# Patient Record
Sex: Female | Born: 1940 | Hispanic: No | Smoking: Never smoker
Health system: Southern US, Community
[De-identification: ages and names within clinical notes are randomized; demographics above are authoritative.]

## PROBLEM LIST (undated history)

## (undated) DIAGNOSIS — Z8781 Personal history of (healed) traumatic fracture: Secondary | ICD-10-CM

## (undated) DIAGNOSIS — K591 Functional diarrhea: Secondary | ICD-10-CM

## (undated) DIAGNOSIS — J302 Other seasonal allergic rhinitis: Secondary | ICD-10-CM

## (undated) DIAGNOSIS — L853 Xerosis cutis: Secondary | ICD-10-CM

## (undated) DIAGNOSIS — Z9889 Other specified postprocedural states: Secondary | ICD-10-CM

## (undated) DIAGNOSIS — D696 Thrombocytopenia, unspecified: Secondary | ICD-10-CM

## (undated) DIAGNOSIS — Z8719 Personal history of other diseases of the digestive system: Secondary | ICD-10-CM

## (undated) DIAGNOSIS — E119 Type 2 diabetes mellitus without complications: Secondary | ICD-10-CM

## (undated) DIAGNOSIS — Z8619 Personal history of other infectious and parasitic diseases: Secondary | ICD-10-CM

## (undated) DIAGNOSIS — A048 Other specified bacterial intestinal infections: Secondary | ICD-10-CM

## (undated) DIAGNOSIS — S42201A Unspecified fracture of upper end of right humerus, initial encounter for closed fracture: Secondary | ICD-10-CM

## (undated) DIAGNOSIS — I700 Atherosclerosis of aorta: Secondary | ICD-10-CM

## (undated) DIAGNOSIS — I10 Essential (primary) hypertension: Secondary | ICD-10-CM

## (undated) DIAGNOSIS — Z961 Presence of intraocular lens: Secondary | ICD-10-CM

## (undated) DIAGNOSIS — Z201 Contact with and (suspected) exposure to tuberculosis: Secondary | ICD-10-CM

## (undated) DIAGNOSIS — Z227 Latent tuberculosis: Secondary | ICD-10-CM

## (undated) DIAGNOSIS — M353 Polymyalgia rheumatica: Secondary | ICD-10-CM

## (undated) DIAGNOSIS — E871 Hypo-osmolality and hyponatremia: Secondary | ICD-10-CM

## (undated) DIAGNOSIS — S32040A Wedge compression fracture of fourth lumbar vertebra, initial encounter for closed fracture: Secondary | ICD-10-CM

## (undated) DIAGNOSIS — G9341 Metabolic encephalopathy: Secondary | ICD-10-CM

## (undated) DIAGNOSIS — M810 Age-related osteoporosis without current pathological fracture: Secondary | ICD-10-CM

## (undated) HISTORY — DX: Metabolic encephalopathy: G93.41

## (undated) HISTORY — PX: OTHER SURGICAL HISTORY: SHX169

---

## 2011-12-23 ENCOUNTER — Ambulatory Visit (HOSPITAL_BASED_OUTPATIENT_CLINIC_OR_DEPARTMENT_OTHER)
Admission: RE | Admit: 2011-12-23 | Discharge: 2011-12-23 | Disposition: A | Discharge status: Home / Self Care | Payer: Self-pay | Source: Ambulatory Visit | Attending: Internal Medicine | Admitting: Internal Medicine

## 2011-12-23 ENCOUNTER — Other Ambulatory Visit (HOSPITAL_BASED_OUTPATIENT_CLINIC_OR_DEPARTMENT_OTHER): Payer: Self-pay | Admitting: Internal Medicine

## 2011-12-23 DIAGNOSIS — R52 Pain, unspecified: Secondary | ICD-10-CM

## 2011-12-23 DIAGNOSIS — M25519 Pain in unspecified shoulder: Secondary | ICD-10-CM | POA: Insufficient documentation

## 2015-04-14 DIAGNOSIS — H401190 Primary open-angle glaucoma, unspecified eye, stage unspecified: Secondary | ICD-10-CM | POA: Diagnosis present

## 2015-06-11 DIAGNOSIS — Z961 Presence of intraocular lens: Secondary | ICD-10-CM

## 2015-06-11 HISTORY — DX: Presence of intraocular lens: Z96.1

## 2015-06-26 DIAGNOSIS — M810 Age-related osteoporosis without current pathological fracture: Secondary | ICD-10-CM

## 2015-06-26 DIAGNOSIS — E785 Hyperlipidemia, unspecified: Secondary | ICD-10-CM

## 2015-06-26 DIAGNOSIS — E1169 Type 2 diabetes mellitus with other specified complication: Secondary | ICD-10-CM | POA: Diagnosis present

## 2015-06-26 HISTORY — DX: Age-related osteoporosis without current pathological fracture: M81.0

## 2015-07-18 DIAGNOSIS — Z9889 Other specified postprocedural states: Secondary | ICD-10-CM | POA: Insufficient documentation

## 2015-07-18 HISTORY — DX: Other specified postprocedural states: Z98.890

## 2015-08-13 DIAGNOSIS — E119 Type 2 diabetes mellitus without complications: Secondary | ICD-10-CM

## 2015-08-13 DIAGNOSIS — I10 Essential (primary) hypertension: Secondary | ICD-10-CM | POA: Diagnosis present

## 2015-08-13 DIAGNOSIS — IMO0001 Reserved for inherently not codable concepts without codable children: Secondary | ICD-10-CM

## 2015-08-13 DIAGNOSIS — K7460 Unspecified cirrhosis of liver: Secondary | ICD-10-CM

## 2015-08-13 DIAGNOSIS — E860 Dehydration: Secondary | ICD-10-CM | POA: Diagnosis present

## 2015-08-13 DIAGNOSIS — K7469 Other cirrhosis of liver: Secondary | ICD-10-CM | POA: Diagnosis present

## 2015-08-13 DIAGNOSIS — K219 Gastro-esophageal reflux disease without esophagitis: Secondary | ICD-10-CM | POA: Diagnosis present

## 2015-08-13 DIAGNOSIS — E785 Hyperlipidemia, unspecified: Secondary | ICD-10-CM | POA: Diagnosis present

## 2015-09-26 DIAGNOSIS — Z201 Contact with and (suspected) exposure to tuberculosis: Secondary | ICD-10-CM

## 2015-09-26 DIAGNOSIS — D696 Thrombocytopenia, unspecified: Secondary | ICD-10-CM

## 2015-09-26 DIAGNOSIS — G43009 Migraine without aura, not intractable, without status migrainosus: Secondary | ICD-10-CM | POA: Insufficient documentation

## 2015-09-26 DIAGNOSIS — E1142 Type 2 diabetes mellitus with diabetic polyneuropathy: Secondary | ICD-10-CM | POA: Diagnosis present

## 2015-09-26 DIAGNOSIS — Z8719 Personal history of other diseases of the digestive system: Secondary | ICD-10-CM

## 2015-09-26 HISTORY — DX: Contact with and (suspected) exposure to tuberculosis: Z20.1

## 2015-09-26 HISTORY — DX: Personal history of other diseases of the digestive system: Z87.19

## 2015-09-26 HISTORY — DX: Thrombocytopenia, unspecified: D69.6

## 2015-10-02 DIAGNOSIS — E039 Hypothyroidism, unspecified: Secondary | ICD-10-CM | POA: Diagnosis present

## 2015-11-20 DIAGNOSIS — Z8781 Personal history of (healed) traumatic fracture: Secondary | ICD-10-CM

## 2015-11-20 DIAGNOSIS — R61 Generalized hyperhidrosis: Secondary | ICD-10-CM | POA: Insufficient documentation

## 2015-11-20 DIAGNOSIS — K591 Functional diarrhea: Secondary | ICD-10-CM

## 2015-11-20 DIAGNOSIS — Z8619 Personal history of other infectious and parasitic diseases: Secondary | ICD-10-CM

## 2015-11-20 HISTORY — DX: Functional diarrhea: K59.1

## 2015-11-20 HISTORY — DX: Personal history of other infectious and parasitic diseases: Z86.19

## 2015-11-20 HISTORY — DX: Personal history of (healed) traumatic fracture: Z87.81

## 2015-12-15 DIAGNOSIS — I700 Atherosclerosis of aorta: Secondary | ICD-10-CM

## 2015-12-15 DIAGNOSIS — Z227 Latent tuberculosis: Secondary | ICD-10-CM

## 2015-12-15 HISTORY — DX: Atherosclerosis of aorta: I70.0

## 2015-12-15 HISTORY — DX: Latent tuberculosis: Z22.7

## 2015-12-18 DIAGNOSIS — Z789 Other specified health status: Secondary | ICD-10-CM | POA: Diagnosis present

## 2016-01-20 DIAGNOSIS — E1129 Type 2 diabetes mellitus with other diabetic kidney complication: Secondary | ICD-10-CM | POA: Insufficient documentation

## 2016-01-20 DIAGNOSIS — R809 Proteinuria, unspecified: Secondary | ICD-10-CM

## 2016-02-03 DIAGNOSIS — Z23 Encounter for immunization: Secondary | ICD-10-CM | POA: Insufficient documentation

## 2016-03-16 DIAGNOSIS — Z789 Other specified health status: Secondary | ICD-10-CM | POA: Insufficient documentation

## 2016-04-14 DIAGNOSIS — L853 Xerosis cutis: Secondary | ICD-10-CM

## 2016-04-14 HISTORY — DX: Xerosis cutis: L85.3

## 2016-09-02 DIAGNOSIS — J302 Other seasonal allergic rhinitis: Secondary | ICD-10-CM

## 2016-09-02 HISTORY — DX: Other seasonal allergic rhinitis: J30.2

## 2016-10-07 DIAGNOSIS — A048 Other specified bacterial intestinal infections: Secondary | ICD-10-CM | POA: Insufficient documentation

## 2016-10-07 HISTORY — DX: Other specified bacterial intestinal infections: A04.8

## 2016-12-29 DIAGNOSIS — H26491 Other secondary cataract, right eye: Secondary | ICD-10-CM | POA: Diagnosis present

## 2017-01-12 DIAGNOSIS — S32040A Wedge compression fracture of fourth lumbar vertebra, initial encounter for closed fracture: Secondary | ICD-10-CM

## 2017-01-12 DIAGNOSIS — M353 Polymyalgia rheumatica: Secondary | ICD-10-CM

## 2017-01-12 HISTORY — DX: Polymyalgia rheumatica: M35.3

## 2017-01-12 HISTORY — DX: Wedge compression fracture of fourth lumbar vertebra, initial encounter for closed fracture: S32.040A

## 2017-02-15 DIAGNOSIS — R768 Other specified abnormal immunological findings in serum: Secondary | ICD-10-CM | POA: Diagnosis present

## 2017-03-23 DIAGNOSIS — N183 Chronic kidney disease, stage 3 unspecified: Secondary | ICD-10-CM | POA: Diagnosis present

## 2017-03-23 DIAGNOSIS — D631 Anemia in chronic kidney disease: Secondary | ICD-10-CM | POA: Diagnosis present

## 2017-03-25 ENCOUNTER — Emergency Department (HOSPITAL_BASED_OUTPATIENT_CLINIC_OR_DEPARTMENT_OTHER)
Admission: EM | Admit: 2017-03-25 | Discharge: 2017-03-25 | Disposition: A | Discharge status: Home / Self Care | Payer: Medicare Other | Attending: Emergency Medicine | Admitting: Emergency Medicine

## 2017-03-25 ENCOUNTER — Emergency Department (HOSPITAL_BASED_OUTPATIENT_CLINIC_OR_DEPARTMENT_OTHER): Payer: Medicare Other

## 2017-03-25 ENCOUNTER — Other Ambulatory Visit: Payer: Self-pay

## 2017-03-25 ENCOUNTER — Encounter (HOSPITAL_BASED_OUTPATIENT_CLINIC_OR_DEPARTMENT_OTHER): Payer: Self-pay | Admitting: Emergency Medicine

## 2017-03-25 DIAGNOSIS — W19XXXA Unspecified fall, initial encounter: Secondary | ICD-10-CM | POA: Insufficient documentation

## 2017-03-25 DIAGNOSIS — Y9301 Activity, walking, marching and hiking: Secondary | ICD-10-CM | POA: Insufficient documentation

## 2017-03-25 DIAGNOSIS — E119 Type 2 diabetes mellitus without complications: Secondary | ICD-10-CM | POA: Diagnosis not present

## 2017-03-25 DIAGNOSIS — Y998 Other external cause status: Secondary | ICD-10-CM | POA: Diagnosis not present

## 2017-03-25 DIAGNOSIS — Y929 Unspecified place or not applicable: Secondary | ICD-10-CM | POA: Insufficient documentation

## 2017-03-25 DIAGNOSIS — I10 Essential (primary) hypertension: Secondary | ICD-10-CM | POA: Diagnosis not present

## 2017-03-25 DIAGNOSIS — S42294A Other nondisplaced fracture of upper end of right humerus, initial encounter for closed fracture: Secondary | ICD-10-CM | POA: Diagnosis not present

## 2017-03-25 DIAGNOSIS — R55 Syncope and collapse: Secondary | ICD-10-CM | POA: Diagnosis not present

## 2017-03-25 DIAGNOSIS — E871 Hypo-osmolality and hyponatremia: Secondary | ICD-10-CM

## 2017-03-25 DIAGNOSIS — S4991XA Unspecified injury of right shoulder and upper arm, initial encounter: Secondary | ICD-10-CM | POA: Diagnosis present

## 2017-03-25 HISTORY — DX: Type 2 diabetes mellitus without complications: E11.9

## 2017-03-25 HISTORY — DX: Essential (primary) hypertension: I10

## 2017-03-25 LAB — COMPREHENSIVE METABOLIC PANEL
ALK PHOS: 57 U/L (ref 38–126)
ALT: 26 U/L (ref 14–54)
ANION GAP: 7 (ref 5–15)
AST: 29 U/L (ref 15–41)
Albumin: 3.2 g/dL — ABNORMAL LOW (ref 3.5–5.0)
BILIRUBIN TOTAL: 0.7 mg/dL (ref 0.3–1.2)
BUN: 18 mg/dL (ref 6–20)
CALCIUM: 8.6 mg/dL — AB (ref 8.9–10.3)
CO2: 23 mmol/L (ref 22–32)
Chloride: 98 mmol/L — ABNORMAL LOW (ref 101–111)
Creatinine, Ser: 1.16 mg/dL — ABNORMAL HIGH (ref 0.44–1.00)
GFR calc non Af Amer: 44 mL/min — ABNORMAL LOW (ref >60)
GFR, EST AFRICAN AMERICAN: 51 mL/min — AB (ref >60)
Glucose, Bld: 345 mg/dL — ABNORMAL HIGH (ref 65–99)
Potassium: 4.4 mmol/L (ref 3.5–5.1)
SODIUM: 128 mmol/L — AB (ref 135–145)
TOTAL PROTEIN: 6.9 g/dL (ref 6.5–8.1)

## 2017-03-25 LAB — CBC WITH DIFFERENTIAL/PLATELET
Basophils Absolute: 0 10*3/uL (ref 0.0–0.1)
Basophils Relative: 0 %
Eosinophils Absolute: 0 10*3/uL (ref 0.0–0.7)
Eosinophils Relative: 0 %
HCT: 32.1 % — ABNORMAL LOW (ref 36.0–46.0)
HEMOGLOBIN: 10.5 g/dL — AB (ref 12.0–15.0)
LYMPHS ABS: 1.8 10*3/uL (ref 0.7–4.0)
LYMPHS PCT: 15 %
MCH: 29.9 pg (ref 26.0–34.0)
MCHC: 32.7 g/dL (ref 30.0–36.0)
MCV: 91.5 fL (ref 78.0–100.0)
MONOS PCT: 7 %
Monocytes Absolute: 0.9 10*3/uL (ref 0.1–1.0)
NEUTROS ABS: 9.4 10*3/uL — AB (ref 1.7–7.7)
NEUTROS PCT: 78 %
Platelets: 168 10*3/uL (ref 150–400)
RBC: 3.51 MIL/uL — ABNORMAL LOW (ref 3.87–5.11)
RDW: 14 % (ref 11.5–15.5)
WBC: 12 10*3/uL — ABNORMAL HIGH (ref 4.0–10.5)

## 2017-03-25 LAB — CBG MONITORING, ED
GLUCOSE-CAPILLARY: 343 mg/dL — AB (ref 65–99)
Glucose-Capillary: 283 mg/dL — ABNORMAL HIGH (ref 65–99)

## 2017-03-25 LAB — TROPONIN I: Troponin I: <0.03 ng/mL (ref <0.03)

## 2017-03-25 MED ORDER — SODIUM CHLORIDE 0.9 % IV BOLUS (SEPSIS)
1000.0000 mL | Freq: Once | INTRAVENOUS | Status: AC
  Administered 2017-03-25: 1000 mL via INTRAVENOUS

## 2017-03-25 MED ORDER — MORPHINE SULFATE (PF) 4 MG/ML IV SOLN
4.0000 mg | Freq: Once | INTRAVENOUS | Status: AC
  Administered 2017-03-25: 4 mg via INTRAVENOUS
  Filled 2017-03-25: qty 1

## 2017-03-25 MED ORDER — HYDROCODONE-ACETAMINOPHEN 5-325 MG PO TABS: 1.0000 | ORAL_TABLET | Freq: Four times a day (QID) | ORAL | 0 refills | Status: DC | PRN

## 2017-03-25 NOTE — ED Notes (Signed)
Pt returned from xray  Family at the bedside 

## 2017-03-25 NOTE — ED Triage Notes (Signed)
Pt fell at home onto right shoulder from standing position to carpeted floor.  Pt tells family that she states her vision goes black and she falls.  This has been happening frequently.  Pt's right shoulder appears sagging slightly.

## 2017-03-25 NOTE — Discharge Instructions (Signed)
Take tylenol for pain.   Take vicodin for severe pain.   Use shoulder immobilizer to keep shoulder stable.   See Dr. Ophelia CharterYates' office in a week for repeat xray.   Use wheelchair as you might not be able to use your walker   Your salt level is low. Repeat BMP in a week with your doctor.   Return to ER if you pass out, chest pain, trouble breathing, worse shoulder pain.

## 2017-03-25 NOTE — ED Provider Notes (Signed)
[facility] EMERGENCY DEPARTMENT Provider Note   CSN: [id number] Arrival date & time: 03/25/17  1704     History   Chief Complaint Chief Complaint  Patient presents with  . Loss of Consciousness    HPI Lori Hardy is a 76 y.o. female history of diabetes, hypertension, CKD who presented with fall.  Patient walks with a walker at baseline and started up and was walking and the vision went black and she fell onto the right shoulder.  Daughter was there and witnessed this happening.  Unclear if she passed out or not but she definitely had no seizure-like activity.  Daughter noted a right shoulder deformity.  Patient has been unsteady at baseline and the last time she fell was in July of this year.  Denies any chest pain or shortness of breath or abdominal pain.  She has been eating and drinking well, no fevers at home.   The history is provided by the patient and a relative.    Past Medical History:  Diagnosis Date  . Diabetes mellitus without complication (HCC)   . Hypertension     There are no active problems to display for this patient.   Past Surgical History:  Procedure Laterality Date  . cataracts      OB History    No data available       Home Medications    Prior to Admission medications   Not on File    Family History No family history on file.  Social History Social History   Tobacco Use  . Smoking status: Never Smoker  . Smokeless tobacco: Never Used  Substance Use Topics  . Alcohol use: Not on file  . Drug use: Not on file     Allergies   Patient has no known allergies.   Review of Systems Review of Systems  Musculoskeletal:       R shoulder pain   All other systems reviewed and are negative.    Physical Exam Updated Vital Signs BP (!) 160/103   Pulse 72   Temp 98.3 F (36.8 C)   Resp 16   Ht 5\' 3"  (1.6 m)   Wt 66.7 kg (147 lb)   SpO2 100%   BMI 26.04 kg/m   Physical Exam  Constitutional: She is oriented to  person, place, and time.  Chronically ill, uncomfortable   HENT:  Head: Normocephalic and atraumatic.  Eyes: Conjunctivae and EOM are normal. Pupils are equal, round, and reactive to light.  Neck: Normal range of motion. Neck supple.  No obvious midline deformity   Cardiovascular: Normal rate, regular rhythm and normal heart sounds.  Pulmonary/Chest: Effort normal and breath sounds normal. No stridor. No respiratory distress. She has no wheezes.  Abdominal: Soft. Bowel sounds are normal. She exhibits no distension. There is no tenderness. There is no guarding.  Musculoskeletal:  R shoulder ? Anterior dislocation. Unable to range shoulder. ? R clavicle tenderness as well. No midline spinal tenderness. Nl ROM bilateral hips. Mild R knee tenderness but no obvious deformity   Neurological: She is alert and oriented to person, place, and time.  Skin: Skin is warm.  Psychiatric: She has a normal mood and affect.  Nursing note and vitals reviewed.    ED Treatments / Results  Labs (all labs ordered are listed, but only abnormal results are displayed) Labs Reviewed  CBG MONITORING, ED - Abnormal; Notable for the following components:      Result Value   Glucose-Capillary 343 (*)  All other components within normal limits  URINALYSIS, ROUTINE W REFLEX MICROSCOPIC  CBC WITH DIFFERENTIAL/PLATELET  COMPREHENSIVE METABOLIC PANEL  TROPONIN I  CBG MONITORING, ED    EKG  EKG Interpretation None       Radiology No results found.  Procedures Procedures (including critical care time)  Medications Ordered in ED Medications  morphine 4 MG/ML injection 4 mg (not administered)  sodium chloride 0.9 % bolus 1,000 mL (not administered)     Initial Impression / Assessment and Plan / ED Course  I have reviewed the triage vital signs and the nursing notes.  Pertinent labs & imaging results that were available during my care of the patient were reviewed by me and considered in my medical  decision making (see chart for details).     Lori Hardy is a 76 y.o. female here with fall, R shoulder pain. Unclear if she had a syncope or not. I think she likely was unsteady and then fell. Has obvious R shoulder deformity. Will get labs, CT head/neck, shoulder xray.    9:10 PM WBC 12. Na 128. Not orthostatic. CT head/neck unremarkable. Xray showed R proximal humerus fracture. Unable to get UA as patient missed the hat, no urinary symptoms per family. I ordered wheelchair that they can pick up at [facility]. Will dc home with vicodin for pain, shoulder immobilizer, ortho follow up   Final Clinical Impressions(s) / ED Diagnoses   Final diagnoses:  None    ED Discharge Orders    None       Charlynne PanderYao, Anamaria Dusenbury Hsienta, MD 03/25/17 2113

## 2017-04-06 DIAGNOSIS — S42201A Unspecified fracture of upper end of right humerus, initial encounter for closed fracture: Secondary | ICD-10-CM

## 2017-04-06 HISTORY — DX: Unspecified fracture of upper end of right humerus, initial encounter for closed fracture: S42.201A

## 2017-04-18 DIAGNOSIS — E871 Hypo-osmolality and hyponatremia: Secondary | ICD-10-CM

## 2017-04-18 HISTORY — DX: Hypo-osmolality and hyponatremia: E87.1

## 2017-06-28 DIAGNOSIS — R634 Abnormal weight loss: Secondary | ICD-10-CM | POA: Diagnosis present

## 2017-06-28 DIAGNOSIS — R801 Persistent proteinuria, unspecified: Secondary | ICD-10-CM | POA: Insufficient documentation

## 2018-06-18 ENCOUNTER — Inpatient Hospital Stay (HOSPITAL_COMMUNITY): Payer: Medicare Other

## 2018-06-18 ENCOUNTER — Emergency Department (HOSPITAL_COMMUNITY): Payer: Medicare Other

## 2018-06-18 ENCOUNTER — Inpatient Hospital Stay (HOSPITAL_COMMUNITY)
Admission: EM | Admit: 2018-06-18 | Discharge: 2018-06-22 | DRG: 871 | Disposition: A | Discharge status: Rehabilitation Facility | Payer: Medicare Other | Attending: Family Medicine | Admitting: Family Medicine

## 2018-06-18 ENCOUNTER — Encounter (HOSPITAL_COMMUNITY): Payer: Self-pay

## 2018-06-18 DIAGNOSIS — K7460 Unspecified cirrhosis of liver: Secondary | ICD-10-CM | POA: Diagnosis present

## 2018-06-18 DIAGNOSIS — E876 Hypokalemia: Secondary | ICD-10-CM | POA: Diagnosis not present

## 2018-06-18 DIAGNOSIS — E1142 Type 2 diabetes mellitus with diabetic polyneuropathy: Secondary | ICD-10-CM | POA: Diagnosis present

## 2018-06-18 DIAGNOSIS — R29723 NIHSS score 23: Secondary | ICD-10-CM | POA: Diagnosis present

## 2018-06-18 DIAGNOSIS — E1122 Type 2 diabetes mellitus with diabetic chronic kidney disease: Secondary | ICD-10-CM | POA: Diagnosis present

## 2018-06-18 DIAGNOSIS — A419 Sepsis, unspecified organism: Secondary | ICD-10-CM

## 2018-06-18 DIAGNOSIS — E039 Hypothyroidism, unspecified: Secondary | ICD-10-CM | POA: Diagnosis present

## 2018-06-18 DIAGNOSIS — E871 Hypo-osmolality and hyponatremia: Secondary | ICD-10-CM | POA: Diagnosis present

## 2018-06-18 DIAGNOSIS — N390 Urinary tract infection, site not specified: Secondary | ICD-10-CM | POA: Diagnosis present

## 2018-06-18 DIAGNOSIS — R1110 Vomiting, unspecified: Secondary | ICD-10-CM

## 2018-06-18 DIAGNOSIS — R109 Unspecified abdominal pain: Secondary | ICD-10-CM

## 2018-06-18 DIAGNOSIS — N179 Acute kidney failure, unspecified: Secondary | ICD-10-CM | POA: Diagnosis present

## 2018-06-18 DIAGNOSIS — Z79899 Other long term (current) drug therapy: Secondary | ICD-10-CM

## 2018-06-18 DIAGNOSIS — K219 Gastro-esophageal reflux disease without esophagitis: Secondary | ICD-10-CM | POA: Diagnosis present

## 2018-06-18 DIAGNOSIS — D631 Anemia in chronic kidney disease: Secondary | ICD-10-CM | POA: Diagnosis present

## 2018-06-18 DIAGNOSIS — R531 Weakness: Secondary | ICD-10-CM | POA: Diagnosis not present

## 2018-06-18 DIAGNOSIS — R82998 Other abnormal findings in urine: Secondary | ICD-10-CM | POA: Diagnosis present

## 2018-06-18 DIAGNOSIS — R4182 Altered mental status, unspecified: Secondary | ICD-10-CM | POA: Diagnosis present

## 2018-06-18 DIAGNOSIS — B370 Candidal stomatitis: Secondary | ICD-10-CM | POA: Diagnosis present

## 2018-06-18 DIAGNOSIS — I4891 Unspecified atrial fibrillation: Secondary | ICD-10-CM | POA: Diagnosis not present

## 2018-06-18 DIAGNOSIS — E872 Acidosis: Secondary | ICD-10-CM | POA: Diagnosis present

## 2018-06-18 DIAGNOSIS — G9341 Metabolic encephalopathy: Secondary | ICD-10-CM | POA: Diagnosis present

## 2018-06-18 DIAGNOSIS — M353 Polymyalgia rheumatica: Secondary | ICD-10-CM | POA: Diagnosis present

## 2018-06-18 DIAGNOSIS — M62421 Contracture of muscle, right upper arm: Secondary | ICD-10-CM | POA: Diagnosis present

## 2018-06-18 DIAGNOSIS — I129 Hypertensive chronic kidney disease with stage 1 through stage 4 chronic kidney disease, or unspecified chronic kidney disease: Secondary | ICD-10-CM | POA: Diagnosis present

## 2018-06-18 DIAGNOSIS — E860 Dehydration: Secondary | ICD-10-CM | POA: Diagnosis present

## 2018-06-18 DIAGNOSIS — A4181 Sepsis due to Enterococcus: Secondary | ICD-10-CM | POA: Diagnosis present

## 2018-06-18 DIAGNOSIS — G40909 Epilepsy, unspecified, not intractable, without status epilepticus: Secondary | ICD-10-CM | POA: Diagnosis present

## 2018-06-18 DIAGNOSIS — K7469 Other cirrhosis of liver: Secondary | ICD-10-CM | POA: Diagnosis present

## 2018-06-18 DIAGNOSIS — H26491 Other secondary cataract, right eye: Secondary | ICD-10-CM | POA: Diagnosis present

## 2018-06-18 DIAGNOSIS — D638 Anemia in other chronic diseases classified elsewhere: Secondary | ICD-10-CM

## 2018-06-18 DIAGNOSIS — E1169 Type 2 diabetes mellitus with other specified complication: Secondary | ICD-10-CM | POA: Diagnosis present

## 2018-06-18 DIAGNOSIS — I10 Essential (primary) hypertension: Secondary | ICD-10-CM | POA: Diagnosis present

## 2018-06-18 DIAGNOSIS — N10 Acute tubulo-interstitial nephritis: Secondary | ICD-10-CM

## 2018-06-18 DIAGNOSIS — R339 Retention of urine, unspecified: Secondary | ICD-10-CM | POA: Diagnosis not present

## 2018-06-18 DIAGNOSIS — E1165 Type 2 diabetes mellitus with hyperglycemia: Secondary | ICD-10-CM | POA: Diagnosis present

## 2018-06-18 DIAGNOSIS — Z794 Long term (current) use of insulin: Secondary | ICD-10-CM

## 2018-06-18 DIAGNOSIS — Z789 Other specified health status: Secondary | ICD-10-CM | POA: Diagnosis present

## 2018-06-18 DIAGNOSIS — N183 Chronic kidney disease, stage 3 unspecified: Secondary | ICD-10-CM | POA: Diagnosis present

## 2018-06-18 DIAGNOSIS — Z888 Allergy status to other drugs, medicaments and biological substances status: Secondary | ICD-10-CM

## 2018-06-18 DIAGNOSIS — H518 Other specified disorders of binocular movement: Secondary | ICD-10-CM | POA: Diagnosis present

## 2018-06-18 DIAGNOSIS — R32 Unspecified urinary incontinence: Secondary | ICD-10-CM | POA: Diagnosis not present

## 2018-06-18 DIAGNOSIS — R768 Other specified abnormal immunological findings in serum: Secondary | ICD-10-CM | POA: Diagnosis present

## 2018-06-18 DIAGNOSIS — N182 Chronic kidney disease, stage 2 (mild): Secondary | ICD-10-CM

## 2018-06-18 DIAGNOSIS — K5900 Constipation, unspecified: Secondary | ICD-10-CM | POA: Diagnosis present

## 2018-06-18 DIAGNOSIS — Z8673 Personal history of transient ischemic attack (TIA), and cerebral infarction without residual deficits: Secondary | ICD-10-CM

## 2018-06-18 DIAGNOSIS — I169 Hypertensive crisis, unspecified: Secondary | ICD-10-CM | POA: Diagnosis not present

## 2018-06-18 DIAGNOSIS — G9340 Encephalopathy, unspecified: Secondary | ICD-10-CM

## 2018-06-18 DIAGNOSIS — Z6825 Body mass index (BMI) 25.0-25.9, adult: Secondary | ICD-10-CM

## 2018-06-18 DIAGNOSIS — E785 Hyperlipidemia, unspecified: Secondary | ICD-10-CM | POA: Diagnosis present

## 2018-06-18 DIAGNOSIS — J302 Other seasonal allergic rhinitis: Secondary | ICD-10-CM | POA: Diagnosis present

## 2018-06-18 DIAGNOSIS — I700 Atherosclerosis of aorta: Secondary | ICD-10-CM | POA: Diagnosis present

## 2018-06-18 DIAGNOSIS — E119 Type 2 diabetes mellitus without complications: Secondary | ICD-10-CM

## 2018-06-18 DIAGNOSIS — R634 Abnormal weight loss: Secondary | ICD-10-CM | POA: Diagnosis present

## 2018-06-18 DIAGNOSIS — H401190 Primary open-angle glaucoma, unspecified eye, stage unspecified: Secondary | ICD-10-CM | POA: Diagnosis present

## 2018-06-18 DIAGNOSIS — M810 Age-related osteoporosis without current pathological fracture: Secondary | ICD-10-CM | POA: Diagnosis present

## 2018-06-18 DIAGNOSIS — IMO0001 Reserved for inherently not codable concepts without codable children: Secondary | ICD-10-CM

## 2018-06-18 DIAGNOSIS — Z79890 Hormone replacement therapy (postmenopausal): Secondary | ICD-10-CM

## 2018-06-18 DIAGNOSIS — G8384 Todd's paralysis (postepileptic): Secondary | ICD-10-CM | POA: Diagnosis present

## 2018-06-18 DIAGNOSIS — K529 Noninfective gastroenteritis and colitis, unspecified: Secondary | ICD-10-CM | POA: Diagnosis present

## 2018-06-18 DIAGNOSIS — Z8619 Personal history of other infectious and parasitic diseases: Secondary | ICD-10-CM

## 2018-06-18 DIAGNOSIS — E669 Obesity, unspecified: Secondary | ICD-10-CM | POA: Diagnosis present

## 2018-06-18 HISTORY — DX: Age-related osteoporosis without current pathological fracture: M81.0

## 2018-06-18 HISTORY — DX: Personal history of other infectious and parasitic diseases: Z86.19

## 2018-06-18 HISTORY — DX: Other specified postprocedural states: Z98.890

## 2018-06-18 HISTORY — DX: Wedge compression fracture of fourth lumbar vertebra, initial encounter for closed fracture: S32.040A

## 2018-06-18 HISTORY — DX: Unspecified fracture of upper end of right humerus, initial encounter for closed fracture: S42.201A

## 2018-06-18 HISTORY — DX: Functional diarrhea: K59.1

## 2018-06-18 HISTORY — DX: Personal history of other diseases of the digestive system: Z87.19

## 2018-06-18 HISTORY — DX: Latent tuberculosis: Z22.7

## 2018-06-18 HISTORY — DX: Other specified bacterial intestinal infections: A04.8

## 2018-06-18 HISTORY — DX: Contact with and (suspected) exposure to tuberculosis: Z20.1

## 2018-06-18 HISTORY — DX: Polymyalgia rheumatica: M35.3

## 2018-06-18 HISTORY — DX: Presence of intraocular lens: Z96.1

## 2018-06-18 HISTORY — DX: Hypo-osmolality and hyponatremia: E87.1

## 2018-06-18 HISTORY — DX: Atherosclerosis of aorta: I70.0

## 2018-06-18 HISTORY — DX: Thrombocytopenia, unspecified: D69.6

## 2018-06-18 HISTORY — DX: Other seasonal allergic rhinitis: J30.2

## 2018-06-18 HISTORY — DX: Xerosis cutis: L85.3

## 2018-06-18 HISTORY — DX: Personal history of (healed) traumatic fracture: Z87.81

## 2018-06-18 LAB — CBC WITH DIFFERENTIAL/PLATELET
Abs Immature Granulocytes: 0.1 10*3/uL — ABNORMAL HIGH (ref 0.00–0.07)
Basophils Absolute: 0 10*3/uL (ref 0.0–0.1)
Basophils Relative: 0 %
Eosinophils Absolute: 0 10*3/uL (ref 0.0–0.5)
Eosinophils Relative: 0 %
HCT: 37.2 % (ref 36.0–46.0)
Hemoglobin: 11.7 g/dL — ABNORMAL LOW (ref 12.0–15.0)
Immature Granulocytes: 1 %
Lymphocytes Relative: 24 %
Lymphs Abs: 3.6 10*3/uL (ref 0.7–4.0)
MCH: 28.1 pg (ref 26.0–34.0)
MCHC: 31.5 g/dL (ref 30.0–36.0)
MCV: 89.2 fL (ref 80.0–100.0)
Monocytes Absolute: 1.5 10*3/uL — ABNORMAL HIGH (ref 0.1–1.0)
Monocytes Relative: 10 %
Neutro Abs: 9.4 10*3/uL — ABNORMAL HIGH (ref 1.7–7.7)
Neutrophils Relative %: 65 %
Platelets: 228 10*3/uL (ref 150–400)
RBC: 4.17 MIL/uL (ref 3.87–5.11)
RDW: 15 % (ref 11.5–15.5)
WBC: 14.5 10*3/uL — ABNORMAL HIGH (ref 4.0–10.5)
nRBC: 0 % (ref 0.0–0.2)

## 2018-06-18 LAB — CBG MONITORING, ED
Glucose-Capillary: 404 mg/dL — ABNORMAL HIGH (ref 70–99)
Glucose-Capillary: 435 mg/dL — ABNORMAL HIGH (ref 70–99)
Glucose-Capillary: 454 mg/dL — ABNORMAL HIGH (ref 70–99)
Glucose-Capillary: 412 mg/dL — ABNORMAL HIGH (ref 70–99)

## 2018-06-18 LAB — COMPREHENSIVE METABOLIC PANEL
ALT: 25 U/L (ref 0–44)
AST: 39 U/L (ref 15–41)
Albumin: 2.7 g/dL — ABNORMAL LOW (ref 3.5–5.0)
Alkaline Phosphatase: 135 U/L — ABNORMAL HIGH (ref 38–126)
Anion gap: 14 (ref 5–15)
BUN: 26 mg/dL — ABNORMAL HIGH (ref 8–23)
CO2: 19 mmol/L — ABNORMAL LOW (ref 22–32)
Calcium: 8.9 mg/dL (ref 8.9–10.3)
Chloride: 94 mmol/L — ABNORMAL LOW (ref 98–111)
Creatinine, Ser: 1.54 mg/dL — ABNORMAL HIGH (ref 0.44–1.00)
GFR calc Af Amer: 37 mL/min — ABNORMAL LOW (ref >60)
GFR calc non Af Amer: 32 mL/min — ABNORMAL LOW (ref >60)
Glucose, Bld: 453 mg/dL — ABNORMAL HIGH (ref 70–99)
Potassium: 4.5 mmol/L (ref 3.5–5.1)
Sodium: 127 mmol/L — ABNORMAL LOW (ref 135–145)
Total Bilirubin: 1.3 mg/dL — ABNORMAL HIGH (ref 0.3–1.2)
Total Protein: 7.6 g/dL (ref 6.5–8.1)

## 2018-06-18 LAB — CSF CELL COUNT WITH DIFFERENTIAL
RBC COUNT CSF: UNDETERMINED /mm3
RBC Count, CSF: 670 /mm3 — ABNORMAL HIGH
Tube #: 1
Tube #: 4
WBC, CSF: 8 /mm3 — ABNORMAL HIGH (ref 0–5)
WBC, CSF: UNDETERMINED /mm3 (ref 0–5)

## 2018-06-18 LAB — INFLUENZA PANEL BY PCR (TYPE A & B)
Influenza A By PCR: NEGATIVE
Influenza B By PCR: NEGATIVE

## 2018-06-18 LAB — BASIC METABOLIC PANEL
Anion gap: 11 (ref 5–15)
BUN: 27 mg/dL — ABNORMAL HIGH (ref 8–23)
CO2: 16 mmol/L — ABNORMAL LOW (ref 22–32)
Calcium: 8 mg/dL — ABNORMAL LOW (ref 8.9–10.3)
Chloride: 97 mmol/L — ABNORMAL LOW (ref 98–111)
Creatinine, Ser: 1.33 mg/dL — ABNORMAL HIGH (ref 0.44–1.00)
GFR calc Af Amer: 44 mL/min — ABNORMAL LOW (ref >60)
GFR calc non Af Amer: 38 mL/min — ABNORMAL LOW (ref >60)
Glucose, Bld: 463 mg/dL — ABNORMAL HIGH (ref 70–99)
Potassium: 3.8 mmol/L (ref 3.5–5.1)
Sodium: 124 mmol/L — ABNORMAL LOW (ref 135–145)

## 2018-06-18 LAB — POCT I-STAT EG7
Acid-base deficit: 8 mmol/L — ABNORMAL HIGH (ref 0.0–2.0)
Bicarbonate: 16.1 mmol/L — ABNORMAL LOW (ref 20.0–28.0)
Calcium, Ion: 1.06 mmol/L — ABNORMAL LOW (ref 1.15–1.40)
HCT: 34 % — ABNORMAL LOW (ref 36.0–46.0)
Hemoglobin: 11.6 g/dL — ABNORMAL LOW (ref 12.0–15.0)
O2 Saturation: 83 %
Potassium: 4.1 mmol/L (ref 3.5–5.1)
Sodium: 130 mmol/L — ABNORMAL LOW (ref 135–145)
TCO2: 17 mmol/L — ABNORMAL LOW (ref 22–32)
pCO2, Ven: 27.3 mmHg — ABNORMAL LOW (ref 44.0–60.0)
pH, Ven: 7.378 (ref 7.250–7.430)
pO2, Ven: 48 mmHg — ABNORMAL HIGH (ref 32.0–45.0)

## 2018-06-18 LAB — LACTIC ACID, PLASMA
Lactic Acid, Venous: 3.5 mmol/L (ref 0.5–1.9)
Lactic Acid, Venous: 2.8 mmol/L (ref 0.5–1.9)

## 2018-06-18 LAB — I-STAT TROPONIN, ED: Troponin i, poc: 0.02 ng/mL (ref 0.00–0.08)

## 2018-06-18 LAB — PROTEIN, CSF: Total  Protein, CSF: 55 mg/dL — ABNORMAL HIGH (ref 15–45)

## 2018-06-18 LAB — GLUCOSE, CSF: GLUCOSE CSF: 200 mg/dL — AB (ref 40–70)

## 2018-06-18 MED ORDER — ENOXAPARIN SODIUM 40 MG/0.4ML ~~LOC~~ SOLN
40.0000 mg | Freq: Every day | SUBCUTANEOUS | Status: DC
  Filled 2018-06-18: qty 0.4

## 2018-06-18 MED ORDER — ONDANSETRON HCL 4 MG PO TABS: 4.0000 mg | ORAL_TABLET | Freq: Four times a day (QID) | ORAL | Status: DC | PRN

## 2018-06-18 MED ORDER — TIMOLOL MALEATE 0.5 % OP SOLN
1.0000 [drp] | Freq: Every day | OPHTHALMIC | Status: DC
  Administered 2018-06-20 – 2018-06-22 (×3): 1 [drp] via OPHTHALMIC
  Filled 2018-06-18: qty 5

## 2018-06-18 MED ORDER — SODIUM CHLORIDE 0.9 % IV BOLUS (SEPSIS)
1000.0000 mL | Freq: Once | INTRAVENOUS | Status: AC
  Administered 2018-06-18: 1000 mL via INTRAVENOUS

## 2018-06-18 MED ORDER — LIDOCAINE-EPINEPHRINE (PF) 2 %-1:200000 IJ SOLN
10.0000 mL | Freq: Once | INTRAMUSCULAR | Status: DC
  Filled 2018-06-18: qty 20

## 2018-06-18 MED ORDER — INSULIN REGULAR(HUMAN) IN NACL 100-0.9 UT/100ML-% IV SOLN
INTRAVENOUS | Status: DC
  Administered 2018-06-18: 3.8 [IU]/h via INTRAVENOUS
  Filled 2018-06-18 (×2): qty 100

## 2018-06-18 MED ORDER — METRONIDAZOLE IN NACL 5-0.79 MG/ML-% IV SOLN
500.0000 mg | Freq: Three times a day (TID) | INTRAVENOUS | Status: DC
  Administered 2018-06-18 – 2018-06-20 (×6): 500 mg via INTRAVENOUS
  Filled 2018-06-18 (×6): qty 100

## 2018-06-18 MED ORDER — LORAZEPAM 2 MG/ML IJ SOLN: 1.0000 mg | Freq: Once | INTRAMUSCULAR | Status: DC

## 2018-06-18 MED ORDER — DORZOLAMIDE HCL 2 % OP SOLN
1.0000 [drp] | Freq: Two times a day (BID) | OPHTHALMIC | Status: DC
  Administered 2018-06-19 – 2018-06-22 (×6): 1 [drp] via OPHTHALMIC
  Filled 2018-06-18: qty 10

## 2018-06-18 MED ORDER — IOPAMIDOL (ISOVUE-370) INJECTION 76%
50.0000 mL | Freq: Once | INTRAVENOUS | Status: AC | PRN
  Administered 2018-06-18: 50 mL via INTRAVENOUS

## 2018-06-18 MED ORDER — SODIUM CHLORIDE 0.9 % IV SOLN
INTRAVENOUS | Status: DC | PRN
  Administered 2018-06-18: 30 mL via INTRAVENOUS

## 2018-06-18 MED ORDER — VANCOMYCIN HCL IN DEXTROSE 1-5 GM/200ML-% IV SOLN
1000.0000 mg | INTRAVENOUS | Status: DC
  Administered 2018-06-19: 1000 mg via INTRAVENOUS
  Filled 2018-06-18 (×2): qty 200

## 2018-06-18 MED ORDER — SODIUM CHLORIDE 0.9 % IV SOLN
2.0000 g | Freq: Once | INTRAVENOUS | Status: AC
  Administered 2018-06-18: 2 g via INTRAVENOUS
  Filled 2018-06-18: qty 2

## 2018-06-18 MED ORDER — SODIUM CHLORIDE 0.9 % IV SOLN: INTRAVENOUS | Status: AC

## 2018-06-18 MED ORDER — ENOXAPARIN SODIUM 30 MG/0.3ML ~~LOC~~ SOLN: 30.0000 mg | SUBCUTANEOUS | Status: DC

## 2018-06-18 MED ORDER — SODIUM CHLORIDE 0.9 % IV BOLUS (SEPSIS)
250.0000 mL | Freq: Once | INTRAVENOUS | Status: AC
  Administered 2018-06-18: 250 mL via INTRAVENOUS

## 2018-06-18 MED ORDER — SODIUM CHLORIDE 0.9 % IV SOLN
2.0000 g | INTRAVENOUS | Status: DC
  Administered 2018-06-19: 2 g via INTRAVENOUS
  Filled 2018-06-18 (×2): qty 2

## 2018-06-18 MED ORDER — IOPAMIDOL (ISOVUE-370) INJECTION 76%
INTRAVENOUS | Status: AC
  Filled 2018-06-18: qty 50

## 2018-06-18 MED ORDER — SODIUM CHLORIDE 0.9 % IV SOLN
INTRAVENOUS | Status: DC
  Administered 2018-06-19 – 2018-06-20 (×3): via INTRAVENOUS

## 2018-06-18 MED ORDER — ACETAMINOPHEN 325 MG PO TABS: 650.0000 mg | ORAL_TABLET | Freq: Four times a day (QID) | ORAL | Status: DC | PRN

## 2018-06-18 MED ORDER — ONDANSETRON HCL 4 MG/2ML IJ SOLN
4.0000 mg | Freq: Four times a day (QID) | INTRAMUSCULAR | Status: DC | PRN
  Administered 2018-06-19: 4 mg via INTRAVENOUS
  Filled 2018-06-18 (×2): qty 2

## 2018-06-18 MED ORDER — DEXTROSE-NACL 5-0.45 % IV SOLN
INTRAVENOUS | Status: DC
  Administered 2018-06-19 – 2018-06-20 (×2): via INTRAVENOUS

## 2018-06-18 MED ORDER — LATANOPROST 0.005 % OP SOLN
1.0000 [drp] | Freq: Every day | OPHTHALMIC | Status: DC
  Administered 2018-06-19 – 2018-06-20 (×2): 1 [drp] via OPHTHALMIC
  Filled 2018-06-18: qty 2.5

## 2018-06-18 MED ORDER — VANCOMYCIN HCL IN DEXTROSE 1-5 GM/200ML-% IV SOLN
1000.0000 mg | Freq: Once | INTRAVENOUS | Status: AC
  Administered 2018-06-18: 1000 mg via INTRAVENOUS
  Filled 2018-06-18: qty 200

## 2018-06-18 MED ORDER — DEXTROSE 5 % IV SOLN
10.0000 mg/kg | Freq: Two times a day (BID) | INTRAVENOUS | Status: DC
  Administered 2018-06-19 (×2): 660 mg via INTRAVENOUS
  Filled 2018-06-18 (×3): qty 13.2

## 2018-06-18 MED ORDER — ACETAMINOPHEN 650 MG RE SUPP: 650.0000 mg | Freq: Four times a day (QID) | RECTAL | Status: DC | PRN

## 2018-06-18 MED ORDER — LORAZEPAM 2 MG/ML IJ SOLN
INTRAMUSCULAR | Status: AC
  Administered 2018-06-18: 1 mg
  Filled 2018-06-18: qty 1

## 2018-06-18 NOTE — ED Notes (Signed)
Patient transported to MRI 

## 2018-06-18 NOTE — ED Provider Notes (Signed)
  Physical Exam  BP (!) 165/85   Pulse 93   Temp (!) 101.2 F (38.4 C) (Rectal)   Resp (!) 22   Wt 66.2 kg   SpO2 100%   BMI 25.86 kg/m   Physical Exam  ED Course/Procedures     .Lumbar Puncture Date/Time: 06/18/2018 3:29 PM Performed by: Pricilla Loveless, MD Authorized by: Pricilla Loveless, MD   Consent:    Consent obtained:  Verbal and written   Consent given by: daughter.   Risks discussed:  Bleeding, headache, nerve damage, infection and pain Pre-procedure details:    Procedure purpose:  Diagnostic   Preparation: Patient was prepped and draped in usual sterile fashion   Anesthesia (see MAR for exact dosages):    Anesthesia method:  Local infiltration   Local anesthetic:  Lidocaine 1% w/o epi Procedure details:    Lumbar space:  L4-L5 interspace   Patient position:  R lateral decubitus   Needle gauge:  20   Number of attempts:  2   Fluid appearance:  Blood-tinged then clearing   Tubes of fluid:  4   Total volume (ml):  5 Post-procedure:    Puncture site:  Adhesive bandage applied and direct pressure applied   Patient tolerance of procedure:  Tolerated well, no immediate complications Comments:     Bloody then clearing  .Critical Care Performed by: Pricilla Loveless, MD Authorized by: Pricilla Loveless, MD   Critical care provider statement:    Critical care time (minutes):  35   Critical care time was exclusive of:  Separately billable procedures and treating other patients   Critical care was necessary to treat or prevent imminent or life-threatening deterioration of the following conditions:  CNS failure or compromise and sepsis   Critical care was time spent personally by me on the following activities:  Development of treatment plan with patient or surrogate, discussions with consultants, evaluation of patient's response to treatment, examination of patient, obtaining history from patient or surrogate, ordering and performing treatments and interventions, ordering  and review of laboratory studies, ordering and review of radiographic studies, pulse oximetry, re-evaluation of patient's condition and review of old charts    MDM  Patient presents with altered mental state and fever.  Urinalysis concerning for UTI, but at the same time she is having no movement in her left upper extremity starting today.  This is new for her.  Given this, concern for CNS infection.  Could be new stroke, but in the setting of high fever, CNS process needs to be ruled out.  I consented son and daughter.  They agreed to lumbar puncture and we discussed risk/benefits/complications.  LP performed as above, with bloody tap that then cleared.  She will be covered for meningitis as well as herpes encephalitis.  She will need admission to the hospitalist service.  She is otherwise awake and alert though altered.  She is protecting her airway.       Pricilla Loveless, MD 06/18/18 1540

## 2018-06-18 NOTE — Progress Notes (Signed)
Stat EEG completed, results pending  

## 2018-06-18 NOTE — ED Notes (Signed)
[facility] @ radiology called re: U/S orders.  In lieu of the u/s abd limited and renal, change order to u/s abd complete. Dr. Selena Batten returned page and will change order.  Called [facility] and advised.
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
## 2018-06-18 NOTE — ED Provider Notes (Addendum)
[facility]   CSN: [id number] Arrival date & time: 06/18/18  1233     History   Chief Complaint No chief complaint on file.   HPI Lori Hardy is a 78 y.o. female.  HPI patient presents to the emergency department with altered mental status, weakness, fever and vomiting.  The daughter states that this started yesterday with the weakness and seemed to gotten worse over the last 24 hours.  The daughter states that before that she seemed to be in her normal state.  The patient's caregiver is also here and states that she is normally up and ready to go by the time she gets there and she does use a walker to help mobilize.  The patient is unable to give any history at this time. Past Medical History:  Diagnosis Date  . Diabetes mellitus without complication (HCC)   . Hypertension     There are no active problems to display for this patient.   Past Surgical History:  Procedure Laterality Date  . cataracts       OB History   No obstetric history on file.      Home Medications    Prior to Admission medications   Medication Sig Start Date End Date Taking? Authorizing Provider  amLODipine (NORVASC) 5 MG tablet Take 5 mg by mouth daily.    Yes [provider]  Cholecalciferol (VITAMIN D3) 25 MCG (1000 UT) CAPS Take 1,000 mg by mouth daily.   Yes [provider]  diphenoxylate-atropine (LOMOTIL) 2.5-0.025 MG tablet Take 1 tablet by mouth 4 (four) times daily as needed.   Yes [provider]  dorzolamide (TRUSOPT) 2 % ophthalmic solution Place 1 drop into the right eye 2 (two) times daily. 02/27/18  Yes [provider]  esomeprazole (NEXIUM) 40 MG capsule Take 40 mg daily at 12 noon by mouth.   Yes [provider]  glipiZIDE (GLUCOTROL) 10 MG tablet Take 10 mg daily before breakfast by mouth.   Yes [provider]  Insulin Glargine (BASAGLAR KWIKPEN) 100 UNIT/ML SOPN Inject  14 Units into the skin at bedtime. 05/29/18  Yes [provider]  latanoprost (XALATAN) 0.005 % ophthalmic solution Place 1 drop into both eyes at bedtime. 06/17/18  Yes [provider]  levothyroxine (SYNTHROID, LEVOTHROID) 25 MCG tablet Take 25 mcg daily before breakfast by mouth.   Yes [provider]  loratadine (CLARITIN) 10 MG tablet Take 10 mg by mouth daily. 12/27/17  Yes [provider]  losartan (COZAAR) 100 MG tablet Take 100 mg daily by mouth.   Yes [provider]  Melatonin 1 MG TABS Take 1 mg by mouth at bedtime.   Yes [provider]  metoprolol tartrate (LOPRESSOR) 25 MG tablet Take 25 mg by mouth 2 (two) times daily. 05/17/18  Yes [provider]  ondansetron (ZOFRAN) 8 MG tablet Take every 8 (eight) hours as needed by mouth for nausea or vomiting.   Yes [provider]  pioglitazone (ACTOS) 45 MG tablet Take 45 mg daily by mouth.   Yes [provider]  rosuvastatin (CRESTOR) 10 MG tablet Take 10 mg by mouth at bedtime.    Yes [provider]  timolol (TIMOPTIC) 0.5 % ophthalmic solution Place 1 drop into the right eye daily. 02/27/18  Yes [provider]  traMADol (ULTRAM) 50 MG tablet Take 50 mg by mouth every 6 (six) hours as needed for pain. 06/08/18  Yes [provider]  HYDROcodone-acetaminophen (NORCO/VICODIN) 5-325 MG tablet Take 1 tablet every 6 (six) hours as needed by mouth. Patient not taking: Reported on 06/18/2018 03/25/17   Charlynne Pander, MD    Family History History reviewed. No pertinent family history.  Social History Social History   Tobacco Use  . Smoking status: Never Smoker  . Smokeless tobacco: Never Used  Substance Use Topics  . Alcohol use: Not on file  . Drug use: Not on file     Allergies   Brimonidine   Review of Systems Review of Systems Level 5 caveat applies due to altered mental status and severe illness.  Physical  Exam Updated Vital Signs BP (!) 165/85   Pulse 93   Temp (!) 101.2 F (38.4 C) (Rectal)   Resp (!) 22   Wt 66.2 kg   SpO2 100%   BMI 25.86 kg/m   Physical Exam Vitals signs and nursing note reviewed.  Constitutional:      General: She is not in acute distress.    Appearance: She is well-developed.  HENT:     Head: Normocephalic and atraumatic.  Eyes:     Pupils: Pupils are equal, round, and reactive to light.  Neck:     Musculoskeletal: Normal range of motion and neck supple.  Cardiovascular:     Rate and Rhythm: Normal rate and regular rhythm.     Heart sounds: Normal heart sounds. No murmur. No friction rub. No gallop.   Pulmonary:     Effort: Pulmonary effort is normal. No respiratory distress.     Breath sounds: Normal breath sounds. No wheezing.  Abdominal:     General: Bowel sounds are normal. There is no distension.     Palpations: Abdomen is soft.     Tenderness: There is no abdominal tenderness.  Skin:    General: Skin is warm and dry.     Capillary Refill: Capillary refill takes less than 2 seconds.     Findings: No erythema or rash.  Neurological:     Mental Status: She is alert.     Comments: Patient has a rightward gaze and left arm and leg weakness.  Psychiatric:        Behavior: Behavior normal.      ED Treatments / Results  Labs (all labs ordered are listed, but only abnormal results are displayed) Labs Reviewed  LACTIC ACID, PLASMA - Abnormal; Notable for the following components:      Result Value   Lactic Acid, Venous 3.5 (*)    All other components within normal limits  COMPREHENSIVE METABOLIC PANEL - Abnormal; Notable for the following components:   Sodium 127 (*)    Chloride 94 (*)    CO2 19 (*)    Glucose, Bld 453 (*)    BUN 26 (*)    Creatinine, Ser 1.54 (*)    Albumin 2.7 (*)    Alkaline Phosphatase 135 (*)    Total Bilirubin 1.3 (*)    GFR calc non Af Amer 32 (*)    GFR calc Af Amer 37 (*)    All other components within  normal limits  CBC WITH DIFFERENTIAL/PLATELET - Abnormal; Notable for the following components:   WBC 14.5 (*)    Hemoglobin 11.7 (*)    Neutro Abs 9.4 (*)    Monocytes Absolute 1.5 (*)    Abs Immature Granulocytes 0.10 (*)    All other components within normal limits  URINALYSIS, ROUTINE W REFLEX MICROSCOPIC - Abnormal; Notable for  the following components:   Color, Urine AMBER (*)    APPearance TURBID (*)    Glucose, UA >=500 (*)    Hgb urine dipstick MODERATE (*)    Protein, ur >=300 (*)    Leukocytes, UA MODERATE (*)    WBC, UA >50 (*)    Bacteria, UA MANY (*)    Non Squamous Epithelial 0-5 (*)    All other components within normal limits  CBG MONITORING, ED - Abnormal; Notable for the following components:   Glucose-Capillary 404 (*)    All other components within normal limits  CULTURE, BLOOD (ROUTINE X 2)  CULTURE, BLOOD (ROUTINE X 2)  CSF CULTURE  GRAM STAIN  INFLUENZA PANEL BY PCR (TYPE A & B)  LACTIC ACID, PLASMA  CSF CELL COUNT WITH DIFFERENTIAL  CSF CELL COUNT WITH DIFFERENTIAL  GLUCOSE, CSF  PROTEIN, CSF  HERPES SIMPLEX VIRUS(HSV) DNA BY PCR    EKG EKG Interpretation  Date/Time:  Sunday June 18 2018 12:46:34 EST Ventricular Rate:  96 PR Interval:    QRS Duration: 77 QT Interval:  353 QTC Calculation: 444 R Axis:   -35 Text Interpretation:  Sinus rhythm Left axis deviation Abnormal R-wave progression, early transition Baseline wander in lead(s) V3 V6 Poor data quality in current ECG precludes serial comparison Confirmed by Pricilla LovelessGoldston, Scott [phone]([id number]) on 06/18/2018 1:07:22 PM   Radiology Dg Chest 2 View  Result Date: 06/18/2018 CLINICAL DATA:  Weakness and headache EXAM: CHEST - 2 VIEW COMPARISON:  CT chest 06/07/2018 FINDINGS: Atherosclerotic calcification of the aortic arch. Low lung volumes are present, causing crowding of the pulmonary vasculature. Hazy density along the left lateral costophrenic angle. The lungs appear otherwise clear. IMPRESSION: 1.  There is hazy density along the left lateral costophrenic angle which is probably anterior in position based on the lateral projection, and most likely due to the patient's epicardial adipose tissue combined with the low lung volumes. Lingular pneumonia is a less likely differential diagnostic consideration. Correlate with any abnormal breath sounds at the left lung base. 2.  Aortic Atherosclerosis (ICD10-I70.0). 3. Low lung volumes are present, causing crowding of the pulmonary vasculature. Electronically Signed   By: Gaylyn RongWalter  Liebkemann M.D.   On: 06/18/2018 14:05   Ct Head Wo Contrast  Result Date: 06/18/2018 CLINICAL DATA:  Decreased activity, left arm weakness EXAM: CT HEAD WITHOUT CONTRAST TECHNIQUE: Contiguous axial images were obtained from the base of the skull through the vertex without intravenous contrast. COMPARISON:  04/18/2017 FINDINGS: Brain: Remote lacunar infarct posteriorly along the lentiform nucleus. Small remote lacunar infarct in the head of the left caudate nucleus. Periventricular white matter and corona radiata hypodensities favor chronic ischemic microvascular white matter disease. The brainstem and cerebellum appear unremarkable. No significant ventricular abnormality. No intracranial hemorrhage, mass lesion, or acute CVA. Vascular: There is atherosclerotic calcification of the cavernous carotid arteries bilaterally. Skull: Unremarkable Sinuses/Orbits: Mild chronic ethmoid sinusitis. Other: No supplemental non-categorized findings. IMPRESSION: 1. No acute intracranial findings. 2. Certain hypervascular 3. 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 4. Atherosclerosis. 5. Mild chronic ethmoid sinusitis. Electronically Signed   By: Gaylyn RongWalter  Liebkemann M.D.   On: 06/18/2018 14:02    Procedures Procedures (including critical care time)  Medications Ordered in ED Medications  metroNIDAZOLE (FLAGYL) IVPB 500 mg (0 mg  Intravenous Stopped 06/18/18 1524)  0.9 %  sodium chloride infusion (30 mLs Intravenous New Bag/Given 06/18/18 1356)  ceFEPIme (MAXIPIME) 2 g in sodium chloride 0.9 %  100 mL IVPB (has no administration in time range)  vancomycin (VANCOCIN) IVPB 1000 mg/200 mL premix (has no administration in time range)  lidocaine-EPINEPHrine (XYLOCAINE W/EPI) 2 %-1:200000 (PF) injection 10 mL (has no administration in time range)  sodium chloride 0.9 % bolus 1,000 mL (1,000 mLs Intravenous New Bag/Given 06/18/18 1358)    And  sodium chloride 0.9 % bolus 1,000 mL (1,000 mLs Intravenous New Bag/Given 06/18/18 1402)    And  sodium chloride 0.9 % bolus 250 mL (250 mLs Intravenous New Bag/Given 06/18/18 1403)  ceFEPIme (MAXIPIME) 2 g in sodium chloride 0.9 % 100 mL IVPB (0 g Intravenous Stopped 06/18/18 1427)  vancomycin (VANCOCIN) IVPB 1000 mg/200 mL premix (1,000 mg Intravenous New Bag/Given 06/18/18 1429)     Initial Impression / Assessment and Plan / ED Course  I have reviewed the triage vital signs and the nursing notes.  Pertinent labs & imaging results that were available during my care of the patient were reviewed by me and considered in my medical decision making (see chart for details).    CRITICAL CARE Performed by: Jamesetta Orleans Payden Bonus Total critical care time:45 minutes Critical care time was exclusive of separately billable procedures and treating other patients. Critical care was necessary to treat or prevent imminent or life-threatening deterioration. Critical care was time spent personally by me on the following activities: development of treatment plan with patient and/or surrogate as well as nursing, discussions with consultants, evaluation of patient's response to treatment, examination of patient, obtaining history from patient or surrogate, ordering and performing treatments and interventions, ordering and review of laboratory studies, ordering and review of radiographic studies, pulse oximetry and  re-evaluation of patient's condition.   I spoke with the internal medicine residents who will come and evaluate the patient.  I spoke with neurology who advised if the patient's MRI shows positive for stroke he will evaluate the patient at that point.  Patient will need admission to the hospital for further evaluation and care of her most likely sepsis. Final Clinical Impressions(s) / ED Diagnoses   Final diagnoses:  None    ED Discharge Orders    None       Charlestine Night, Cordelia Poche 06/18/18 1541    Pricilla Loveless, MD 06/18/18 1545    Ishika Chesterfield, Cristal Deer, PA-C 06/18/18 1548    Pricilla Loveless, MD 06/19/18 2352

## 2018-06-18 NOTE — ED Triage Notes (Signed)
To room via EMS.  Lives with daughter, has caregiver. Caregiver noticed decreased activity yesterday, last saw her @ 12:45p yesterday.  Caregiver arrived today 11am and noticed pt worse.   EMS BP 125/80 initially, 200/100 2nd BP  95% RA,  Temporal temp 104.0 Pt in normal state converses, ambulates to BR.   EMS gave Solumedrol 125 mg, Mag 2g, HHN Albuterol 10 mg and Atrovent   Left arm noted to be flacid.

## 2018-06-18 NOTE — ED Notes (Signed)
Lori Hardy [phone]

## 2018-06-18 NOTE — ED Notes (Signed)
Per Dr. Amada Jupiter giving 1mg  IV ativan now and may give other 1mg  if instructed.

## 2018-06-18 NOTE — Procedures (Signed)
History: 78 year old female being evaluated for right third gaze deviation  Sedation: None  Technique: This is a 21 channel 40-minute scalp EEG performed at the bedside with bipolar and monopolar montages arranged in accordance to the international 10/20 system of electrode placement. One channel was dedicated to EKG recording.    Background: The background is markedly asymmetric with a relatively high voltage delta and theta activity present on the right side there is no evolution to this activity.  Ativan does not cause a significant change to this activity, but when the patient does fall asleep, this activity continue significantly.  There is also a posterior dominant rhythm seen on the side which does attenuate with eye opening which achieves a frequency of 7 to 8 Hz.  All frequencies on the right side are markedly attenuated, with some irregular delta activity being the predominant finding on that side.  Photic stimulation: Physiologic driving is not performed  EEG Abnormalities: 1) attenuation of faster frequencies in the right 2) generalized irregular delta activity 3) slow posterior dominant rhythm  Clinical Interpretation: This EEG recorded evidence of a widespread right hemispheric dysfunction superimposed on a more generalized nonspecific cerebral dysfunction (encephalopathy).   There was no seizure or seizure predisposition recorded on this study. Please note that lack of epileptiform activity on EEG does not preclude the possibility of epilepsy.   Ritta Slot, MD [facility] [phone]  If 7pm- 7am, please page neurology on call as listed in [facility]ION.

## 2018-06-18 NOTE — Consult Note (Addendum)
NEURO HOSPITALIST CONSULT NOTE   Requesting physician: Dr. Criss Alvine  Reason for Consult:AMS  History obtained from:  Daughter  HPI:                                                                                                                                          Lori Hardy is an 78 y.o. female  With PMH HTN, DM who presented to [facility] ED with c/o AMS, weakness, fever and vomiting.  Per patient's daughter the weakness and vomiting started yesterday and has gotten worse. Patient uses a walker at baseline. Patient does not speak any english. Caregiver stated that she noticed the patient would not use her left arm this morning. Patient noted to have stopped eating on 2/8. Per daughter patient was unable to see snacks on the left side starting on 2/7. Daughter noted that patient had a HA yesterday. Her AMS and left sided symptoms began yesterday and had worsened over the past 24 hours.   Ed course: Tmax: 102.4 ( 39.1) BP: 165/85 Respirations:22 P: 109 UA: + for UTI BG: 404 Na: 127 creatinine 1.54 WBC: 14.5 CTH: no hemorrhage. Small remote lacunar infarcts posteriorly in the right lentiform nucleus  1a Level of Conscious:1 1b LOC Questions: 2 1c LOC Commands: 2 2 Best Gaze: 1 3 Visual: 1 4 Facial Palsy: 0 5a Motor Arm - left: 3 5b Motor Arm - Right: 3 6a Motor Leg - Left: 3 6b Motor Leg - Right: 3 7 Limb Ataxia: 0 8 Sensory: 1 9 Best Language: 3 10 Dysarthria:0 11 Extinct. and Inattention:1 NIHSS TOTAL: 23    Past Medical History:  Diagnosis Date  . Diabetes mellitus without complication (HCC)   . Hypertension     Past Surgical History:  Procedure Laterality Date  . cataracts      History reviewed. No pertinent family history.         Social History:  reports that she has never smoked. She has never used smokeless tobacco. No history on file for alcohol and drug.  Allergies  Allergen Reactions  . Brimonidine Itching    Redness and itching  eye Redness and itching Redness and itching     MEDICATIONS:  Current Facility-Administered Medications  Medication Dose Route Frequency Provider Last Rate Last Dose  . 0.9 %  sodium chloride infusion   Intravenous PRN Pricilla Loveless, MD 100 mL/hr at 06/18/18 1356 30 mL at 06/18/18 1356  . [START ON 06/19/2018] ceFEPIme (MAXIPIME) 2 g in sodium chloride 0.9 % 100 mL IVPB  2 g Intravenous Q24H Robertson, Crystal S, RPH      . lidocaine-EPINEPHrine (XYLOCAINE W/EPI) 2 %-1:200000 (PF) injection 10 mL  10 mL Intradermal Once Pricilla Loveless, MD      . metroNIDAZOLE (FLAGYL) IVPB 500 mg  500 mg Intravenous Q8H Lawyer, Christopher, PA-C   Stopped at 06/18/18 1524  . [START ON 06/19/2018] vancomycin (VANCOCIN) IVPB 1000 mg/200 mL premix  1,000 mg Intravenous Q24H Norva Pavlov, [facility]       Current Outpatient Medications  Medication Sig Dispense Refill  . amLODipine (NORVASC) 5 MG tablet Take 5 mg by mouth daily.     . Cholecalciferol (VITAMIN D3) 25 MCG (1000 UT) CAPS Take 1,000 mg by mouth daily.    . diphenoxylate-atropine (LOMOTIL) 2.5-0.025 MG tablet Take 1 tablet by mouth 4 (four) times daily as needed.    . dorzolamide (TRUSOPT) 2 % ophthalmic solution Place 1 drop into the right eye 2 (two) times daily.    Marland Kitchen esomeprazole (NEXIUM) 40 MG capsule Take 40 mg daily at 12 noon by mouth.    Marland Kitchen glipiZIDE (GLUCOTROL) 10 MG tablet Take 10 mg daily before breakfast by mouth.    . Insulin Glargine (BASAGLAR KWIKPEN) 100 UNIT/ML SOPN Inject 14 Units into the skin at bedtime.    Marland Kitchen latanoprost (XALATAN) 0.005 % ophthalmic solution Place 1 drop into both eyes at bedtime.    Marland Kitchen levothyroxine (SYNTHROID, LEVOTHROID) 25 MCG tablet Take 25 mcg daily before breakfast by mouth.    . loratadine (CLARITIN) 10 MG tablet Take 10 mg by mouth daily.    Marland Kitchen losartan (COZAAR) 100 MG tablet Take 100 mg  daily by mouth.    . Melatonin 1 MG TABS Take 1 mg by mouth at bedtime.    . metoprolol tartrate (LOPRESSOR) 25 MG tablet Take 25 mg by mouth 2 (two) times daily.    . ondansetron (ZOFRAN) 8 MG tablet Take every 8 (eight) hours as needed by mouth for nausea or vomiting.    . pioglitazone (ACTOS) 45 MG tablet Take 45 mg daily by mouth.    . rosuvastatin (CRESTOR) 10 MG tablet Take 10 mg by mouth at bedtime.     . timolol (TIMOPTIC) 0.5 % ophthalmic solution Place 1 drop into the right eye daily.    . traMADol (ULTRAM) 50 MG tablet Take 50 mg by mouth every 6 (six) hours as needed for pain.    Marland Kitchen HYDROcodone-acetaminophen (NORCO/VICODIN) 5-325 MG tablet Take 1 tablet every 6 (six) hours as needed by mouth. (Patient not taking: Reported on 06/18/2018) 8 tablet 0    ROS:  unobtainable from patient due to mental status   Blood pressure (!) 165/85, pulse 93, temperature (!) 101.2 F (38.4 C), temperature source Rectal, resp. rate (!) 22, weight 66.2 kg, SpO2 100 %.   General Examination:                                                                                                      Physical Exam  HEENT-  [place]/AT.  Normal external eye and conjunctiva. No nuchal rigidity.  Cardiovascular- S1-S2 audible,  Lungs-no rhonchi or wheezing noted, no excessive work of breathing.  Saturations within normal limits on RA   Neurological Examination Mental Status: Patient is obtunded, but did open eyes. Has a gaze preference to the right. Not following commands, including when daughter provides interpretation from [place] to [place]. Patient would ask for juice. Will moan with discomfort to noxious stimuli. Left sided neglect.  Cranial Nerves: II:  Did not blink to threat from the left side, but did blink from right side, consistent with a left hemianopsia. III,IV, VI:  PERRL, gaze preference to the right, did not cross midline to the left. This also cannot be overcome with oculocephalic maneuver.  VII: Face grossly symmetric in the context of patient not smiling or grimacing to command VIII: Does not follow verbal commands IX: Head rotated preferentially to the right.  XII: Does not protrude tongue to command.  Motor/ Sensory: Patient appears to neglect LUE and does not move to noxious. Some twitching movements noted in the left arm. Moved with good strength RUE and BLE to noxious.  Deep Tendon Reflexes: 2+ and symmetric patellae Plantars: Right: downgoing   Left: downgoing Cerebellar/Gait: Unable to assess   Lab Results: Basic Metabolic Panel: Recent Labs  Lab 06/18/18 1306  NA 127*  K 4.5  CL 94*  CO2 19*  GLUCOSE 453*  BUN 26*  CREATININE 1.54*  CALCIUM 8.9    CBC: Recent Labs  Lab 06/18/18 1306  WBC 14.5*  NEUTROABS 9.4*  HGB 11.7*  HCT 37.2  MCV 89.2  PLT 228    Imaging: Dg Chest 2 View  Result Date: 06/18/2018 CLINICAL DATA:  Weakness and headache EXAM: CHEST - 2 VIEW COMPARISON:  CT chest 06/07/2018 FINDINGS: Atherosclerotic calcification of the aortic arch. Low lung volumes are present, causing crowding of the pulmonary vasculature. Hazy density along the left lateral costophrenic angle. The lungs appear otherwise clear. IMPRESSION: 1. There is hazy density along the left lateral costophrenic angle which is probably anterior in position based on the lateral projection, and most likely due to the patient's epicardial adipose tissue combined with the low lung volumes. Lingular pneumonia is a less likely differential diagnostic consideration. Correlate with any abnormal breath sounds at the left lung base. 2.  Aortic Atherosclerosis (ICD10-I70.0). 3. Low lung volumes are present, causing crowding of the pulmonary vasculature. Electronically Signed   By: Gaylyn RongWalter  Liebkemann M.D.   On: 06/18/2018 14:05   Ct Head Wo Contrast  Result  Date: 06/18/2018 CLINICAL DATA:  Decreased activity, left arm weakness EXAM: CT HEAD WITHOUT CONTRAST TECHNIQUE: Contiguous axial  images were obtained from the base of the skull through the vertex without intravenous contrast. COMPARISON:  04/18/2017 FINDINGS: Brain: Remote lacunar infarct posteriorly along the lentiform nucleus. Small remote lacunar infarct in the head of the left caudate nucleus. Periventricular white matter and corona radiata hypodensities favor chronic ischemic microvascular white matter disease. The brainstem and cerebellum appear unremarkable. No significant ventricular abnormality. No intracranial hemorrhage, mass lesion, or acute CVA. Vascular: There is atherosclerotic calcification of the cavernous carotid arteries bilaterally. Skull: Unremarkable Sinuses/Orbits: Mild chronic ethmoid sinusitis. Other: No supplemental non-categorized findings. IMPRESSION: 1. No acute intracranial findings. 2. Certain hypervascular 3. 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 4. Atherosclerosis. 5. Mild chronic ethmoid sinusitis. Electronically Signed   By: Gaylyn Rong M.D.   On: 06/18/2018 14:02    Assessment:  78 year old female presenting with malaise/lethargy, fever of 104, vomiting, altered mental status and LUE flaccid weakness  1. CT head reveals no acute intracranial findings. 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  2. Exam findings are most consistent with a lesion versus dysfunction of the right cerebral hemisphere. DDx includes unwitnessed seizure with postictal Todd's paresis and confusion versus a subacute right cerebral hemisphere stroke.  3. LP was performed by EDP. CSF glucose is elevated at 200 consistent with her serum hyperglycemia. RBC elevated most likely secondary to bloody tap as CT shows no subarachnoid hemorrhage and CSF supernatant is not  xanthochromic. CSF protein mildly elevated at 55, consistent with bloody tap. WBC in final tube is 8, a borderline finding which is not felt likely to be of significance given lack of nuchal rigidity on exam.  4. Moderate to severe hyperglycemia, leukocytosis, hyponatremia, elevated BUN/Cr 5. MRI brain preliminarily reviewed. No acute infarction or other lesion seen to explain the patient's left hemineglect, right gaze deviation, left hemianopsia and LUE weakness. Based on unrevealing MRI brain, suspect seizure with Todd's paresis as the most likely component of the DDx 6. Not an endovascular candidate, > 24 hours of neurological symptoms.   Recommendations: - EEG. May start and anticonvulsant pending EEG results.  - Vitamin B12, thiamine levels.  - Ammonia level, TSH - correct metabolic derangements - Being covered with cefepime and vancomycin for meningitis as well as possible herpes encephalitis per EDP note, but CSF profile does not militate in favor of a meningitis.   Valentina Lucks, MSN, NP-C [facility] [phone]  I have examined the patient and interviewed family. I have formulated the assessment and recommendations. 78 year old female presenting with AMS, fever, weakness and vomiting as well as LUE weakness and rightward eye deviation. CT head without acute abnormality. LP overall not consistent with meningitis. Multiple lab findings suggestive of a metabolic encephalopathy in conjunction with systemic infection. EEG and MRI brain are pending.  Electronically signed: Dr. Caryl Pina   06/18/2018, 3:51 PM

## 2018-06-18 NOTE — Progress Notes (Signed)
Pharmacy Antibiotic Note  Lori Hardy is a 78 y.o. female admitted on 06/18/2018 with sepsis of unknown origin.  Pharmacy has been consulted for Vanco/Cefepime dosing.  PMH: diabetes, hypertension, CKD   ID: sepsis of unknown origin. Temp 102.4.WBC 14.5. LA elevated 3.5. Need height for Vanco AUC calculations. Calculated CrCl 31.  Vanco 2/9>> Cefepime 2/9>>   Plan: Vanco 1g IV x 1 then every 24 hrs. Trough after 3-5 doses at steady state. Cefepime 2 IV x 1 then every 24 hrs Flagyl 500mg  IV q 8hrs    Weight: 146 lb (66.2 kg)  Temp (24hrs), Avg:102.4 F (39.1 C), Min:102.4 F (39.1 C), Max:102.4 F (39.1 C)  Recent Labs  Lab 06/18/18 1306  WBC 14.5*  CREATININE 1.54*  LATICACIDVEN 3.5*    CrCl cannot be calculated (Unknown ideal weight.).    Allergies  Allergen Reactions  . Brimonidine Itching    Redness and itching eye Redness and itching Redness and itching    Niquan Charnley S. Merilynn Finland, PharmD, BCPS Clinical Staff Pharmacist  Misty Stanley Stillinger 06/18/2018 2:21 PM

## 2018-06-18 NOTE — ED Notes (Signed)
Patient transported to CT 

## 2018-06-19 ENCOUNTER — Encounter (HOSPITAL_COMMUNITY): Payer: Self-pay

## 2018-06-19 DIAGNOSIS — R109 Unspecified abdominal pain: Secondary | ICD-10-CM | POA: Diagnosis not present

## 2018-06-19 DIAGNOSIS — R739 Hyperglycemia, unspecified: Secondary | ICD-10-CM

## 2018-06-19 DIAGNOSIS — R4182 Altered mental status, unspecified: Secondary | ICD-10-CM

## 2018-06-19 DIAGNOSIS — E039 Hypothyroidism, unspecified: Secondary | ICD-10-CM | POA: Diagnosis not present

## 2018-06-19 DIAGNOSIS — Z794 Long term (current) use of insulin: Secondary | ICD-10-CM

## 2018-06-19 DIAGNOSIS — R531 Weakness: Secondary | ICD-10-CM

## 2018-06-19 DIAGNOSIS — A419 Sepsis, unspecified organism: Secondary | ICD-10-CM

## 2018-06-19 DIAGNOSIS — R569 Unspecified convulsions: Secondary | ICD-10-CM

## 2018-06-19 DIAGNOSIS — E269 Hyperaldosteronism, unspecified: Secondary | ICD-10-CM

## 2018-06-19 DIAGNOSIS — E872 Acidosis: Secondary | ICD-10-CM

## 2018-06-19 DIAGNOSIS — E119 Type 2 diabetes mellitus without complications: Secondary | ICD-10-CM

## 2018-06-19 LAB — GLUCOSE, CAPILLARY
GLUCOSE-CAPILLARY: 154 mg/dL — AB (ref 70–99)
GLUCOSE-CAPILLARY: 187 mg/dL — AB (ref 70–99)
GLUCOSE-CAPILLARY: 173 mg/dL — AB (ref 70–99)
Glucose-Capillary: 48 mg/dL — ABNORMAL LOW (ref 70–99)
Glucose-Capillary: 57 mg/dL — ABNORMAL LOW (ref 70–99)
Glucose-Capillary: 69 mg/dL — ABNORMAL LOW (ref 70–99)
Glucose-Capillary: 123 mg/dL — ABNORMAL HIGH (ref 70–99)
Glucose-Capillary: 166 mg/dL — ABNORMAL HIGH (ref 70–99)
Glucose-Capillary: 183 mg/dL — ABNORMAL HIGH (ref 70–99)
Glucose-Capillary: 172 mg/dL — ABNORMAL HIGH (ref 70–99)
Glucose-Capillary: 144 mg/dL — ABNORMAL HIGH (ref 70–99)
Glucose-Capillary: 181 mg/dL — ABNORMAL HIGH (ref 70–99)
Glucose-Capillary: 179 mg/dL — ABNORMAL HIGH (ref 70–99)
Glucose-Capillary: 240 mg/dL — ABNORMAL HIGH (ref 70–99)
Glucose-Capillary: 243 mg/dL — ABNORMAL HIGH (ref 70–99)
Glucose-Capillary: 209 mg/dL — ABNORMAL HIGH (ref 70–99)
Glucose-Capillary: 182 mg/dL — ABNORMAL HIGH (ref 70–99)
Glucose-Capillary: 141 mg/dL — ABNORMAL HIGH (ref 70–99)
Glucose-Capillary: 122 mg/dL — ABNORMAL HIGH (ref 70–99)

## 2018-06-19 LAB — COMPREHENSIVE METABOLIC PANEL
ALK PHOS: 111 U/L (ref 38–126)
ALT: 21 U/L (ref 0–44)
AST: 28 U/L (ref 15–41)
Albumin: 2.5 g/dL — ABNORMAL LOW (ref 3.5–5.0)
Anion gap: 12 (ref 5–15)
BUN: 29 mg/dL — AB (ref 8–23)
CALCIUM: 8.3 mg/dL — AB (ref 8.9–10.3)
CO2: 16 mmol/L — ABNORMAL LOW (ref 22–32)
Chloride: 103 mmol/L (ref 98–111)
Creatinine, Ser: 1.49 mg/dL — ABNORMAL HIGH (ref 0.44–1.00)
GFR calc Af Amer: 39 mL/min — ABNORMAL LOW (ref >60)
GFR calc non Af Amer: 33 mL/min — ABNORMAL LOW (ref >60)
Glucose, Bld: 289 mg/dL — ABNORMAL HIGH (ref 70–99)
Potassium: 3.3 mmol/L — ABNORMAL LOW (ref 3.5–5.1)
Sodium: 131 mmol/L — ABNORMAL LOW (ref 135–145)
Total Bilirubin: 0.8 mg/dL (ref 0.3–1.2)
Total Protein: 6.5 g/dL (ref 6.5–8.1)

## 2018-06-19 LAB — CBC WITH DIFFERENTIAL/PLATELET
ABS IMMATURE GRANULOCYTES: 0.12 10*3/uL — AB (ref 0.00–0.07)
Basophils Absolute: 0 10*3/uL (ref 0.0–0.1)
Basophils Relative: 0 %
Eosinophils Absolute: 0 10*3/uL (ref 0.0–0.5)
Eosinophils Relative: 0 %
HCT: 31.4 % — ABNORMAL LOW (ref 36.0–46.0)
Hemoglobin: 10 g/dL — ABNORMAL LOW (ref 12.0–15.0)
Immature Granulocytes: 1 %
Lymphocytes Relative: 16 %
Lymphs Abs: 2.3 10*3/uL (ref 0.7–4.0)
MCH: 28.2 pg (ref 26.0–34.0)
MCHC: 31.8 g/dL (ref 30.0–36.0)
MCV: 88.5 fL (ref 80.0–100.0)
MONO ABS: 2 10*3/uL — AB (ref 0.1–1.0)
Monocytes Relative: 14 %
Neutro Abs: 9.9 10*3/uL — ABNORMAL HIGH (ref 1.7–7.7)
Neutrophils Relative %: 69 %
Platelets: 170 10*3/uL (ref 150–400)
RBC: 3.55 MIL/uL — AB (ref 3.87–5.11)
RDW: 15 % (ref 11.5–15.5)
WBC: 14.3 10*3/uL — ABNORMAL HIGH (ref 4.0–10.5)
nRBC: 0 % (ref 0.0–0.2)

## 2018-06-19 LAB — AMMONIA: Ammonia: 14 umol/L (ref 9–35)

## 2018-06-19 LAB — CBG MONITORING, ED
GLUCOSE-CAPILLARY: 392 mg/dL — AB (ref 70–99)
Glucose-Capillary: 341 mg/dL — ABNORMAL HIGH (ref 70–99)
Glucose-Capillary: 300 mg/dL — ABNORMAL HIGH (ref 70–99)
Glucose-Capillary: 151 mg/dL — ABNORMAL HIGH (ref 70–99)
Glucose-Capillary: 96 mg/dL (ref 70–99)

## 2018-06-19 LAB — URINALYSIS, COMPLETE (UACMP) WITH MICROSCOPIC
Bilirubin Urine: NEGATIVE
Glucose, UA: >=500 mg/dL — AB
Ketones, ur: NEGATIVE mg/dL
Nitrite: NEGATIVE
Protein, ur: 100 mg/dL — AB
Specific Gravity, Urine: 1.016 (ref 1.005–1.030)
WBC, UA: >50 WBC/hpf — ABNORMAL HIGH (ref 0–5)
pH: 5 (ref 5.0–8.0)

## 2018-06-19 LAB — LACTIC ACID, PLASMA
LACTIC ACID, VENOUS: 3.9 mmol/L — AB (ref 0.5–1.9)
Lactic Acid, Venous: 2.2 mmol/L (ref 0.5–1.9)
Lactic Acid, Venous: 2.1 mmol/L (ref 0.5–1.9)

## 2018-06-19 LAB — PROTIME-INR
INR: 1.06
Prothrombin Time: 13.7 seconds (ref 11.4–15.2)

## 2018-06-19 LAB — TSH: TSH: 1.678 u[IU]/mL (ref 0.350–4.500)

## 2018-06-19 LAB — PROCALCITONIN: Procalcitonin: 0.11 ng/mL

## 2018-06-19 LAB — VITAMIN B12: Vitamin B-12: 448 pg/mL (ref 180–914)

## 2018-06-19 LAB — CORTISOL-AM, BLOOD: Cortisol - AM: 11.4 ug/dL (ref 6.7–22.6)

## 2018-06-19 MED ORDER — SODIUM CHLORIDE 0.9 % IV BOLUS
1000.0000 mL | Freq: Once | INTRAVENOUS | Status: AC
  Administered 2018-06-19: 1000 mL via INTRAVENOUS

## 2018-06-19 MED ORDER — POTASSIUM CHLORIDE 10 MEQ/100ML IV SOLN
10.0000 meq | INTRAVENOUS | Status: AC
  Administered 2018-06-19 (×2): 10 meq via INTRAVENOUS
  Filled 2018-06-19 (×2): qty 100

## 2018-06-19 MED ORDER — ENOXAPARIN SODIUM 30 MG/0.3ML ~~LOC~~ SOLN
30.0000 mg | Freq: Every day | SUBCUTANEOUS | Status: DC
  Administered 2018-06-19 – 2018-06-20 (×2): 30 mg via SUBCUTANEOUS
  Filled 2018-06-19 (×2): qty 0.3

## 2018-06-19 MED ORDER — DEXTROSE 50 % IV SOLN
12.5000 g | INTRAVENOUS | Status: AC
  Administered 2018-06-19: 12.5 g via INTRAVENOUS

## 2018-06-19 MED ORDER — LEVETIRACETAM IN NACL 500 MG/100ML IV SOLN
500.0000 mg | Freq: Two times a day (BID) | INTRAVENOUS | Status: DC
  Administered 2018-06-19 – 2018-06-21 (×4): 500 mg via INTRAVENOUS
  Filled 2018-06-19 (×4): qty 100

## 2018-06-19 MED ORDER — ALBUTEROL SULFATE (2.5 MG/3ML) 0.083% IN NEBU: 2.5000 mg | INHALATION_SOLUTION | Freq: Four times a day (QID) | RESPIRATORY_TRACT | Status: DC | PRN

## 2018-06-19 MED ORDER — DEXTROSE 50 % IV SOLN
25.0000 g | INTRAVENOUS | Status: AC
  Administered 2018-06-19: 25 g via INTRAVENOUS

## 2018-06-19 NOTE — Progress Notes (Signed)
Report received from [facility] from ed.

## 2018-06-19 NOTE — Progress Notes (Signed)
VAST RN consulted to assess PIV located in upper right shoulder/chest. Spoke with unit RN who stated pt is receiving an insulin drip, fluids, and antibiotics; currently has a PIV in right hand which is working well, a PIV in left [facility] which is working, but beeps occluded frequently, and a PIV in right shoulder/chest which is swollen. Unit RN wanted a second assess before dcing shoulder IV.  VAST RN assessed IV and deemed it is infiltrated; PIV discontinued.  VAST RN assessed pt for further PIV access; however, both arms are somewhat contracted and difficult to assess.  VAST RN informed unit RN of findings and advised that two VAST RN's will return to attempt another line. Unit RN unsure of plan of care for pt as she just arrived early this am.

## 2018-06-19 NOTE — Progress Notes (Signed)
Reckeck blood sugar 125, insulin drip started at 0.55ml/hr per glucose stabilizer. On call MD paged, MD called back and MD aware of situation. Stated will pass on to day shift. Will continue to monitor.

## 2018-06-19 NOTE — Progress Notes (Signed)
Texted paged on call MD and notified pt's recheck blood sugar is 69, 15ml D50 administered per glucose stabilizer and insulin on hold. will recheck cbg in  in 15 min. Will continue to monitor.

## 2018-06-19 NOTE — Progress Notes (Signed)
Family Medicine Teaching Service Daily Progress Note Intern Pager: [phone]  Patient name: Lori Hardy Medical record number: [id number] Date of birth: 01/25/1941 Age: 78 y.o. Gender: female  Primary Care Provider: Ron Parker, MD (Inactive)  Consultants: Neurology, pharm consults, ID  Code Status: Full Code   Pt Overview and Major Events to Date:  Hospital Day: 2  Admitted: 06/18/2018 for CC: AMS  Assessment and Plan: Lori Hardy is a 78 y.o. right handed female presenting with altered mental status, last known well 2/7 afternoon, hyperglycemia and sepsis.  PMH is significant for DM, CKD 3, ACD, HLD, HTN, hypothyroidism, GERD, Leutscher's syndrome, positive ANA, primary open angle glaucoma, polyneuropathy, cirrhosis on imaging.  # AMS  2/2 Septic Encephalopathy in the setting of sepsis and new onset left sided weakness Overnight, Lactic acid of rose to 3.9 and pt provided with 1 L bolus. Remainder of work up significant for EEG showing "widespread right hemispheric dysfuntion superimposed on a more generalized nonspcific cerebral dysfunction," consistent with encephalopathy. AST, ALT and ammonia wnl making hepatic encephalopathy less likely (with patient's remote history of liver cirrhosis on imaging). TSH wnl. Most likely cause on current differential is acute septic encephalopathy (which is the most common cause of acute TME). In septic encephalopathy, MRI findings may be seen but can also be negative.  Additionally, LP protein can be normal or elevated.  EEG findings with diffuse slowing and diffuse muscle weakness can be found in up to 70% of patients.  Treatment of for acute septic encephalopathy is to treat underlying cause.  Speech saw patient today and she is severe aspiration risk and will remain NPO with exception of ice chips PRN after oral care.   D/c Seizure precautions  Follow-up neurology recommendations ? F/u Vitamin B12, thiamine levels  Infection treatment as  below   # Sepsis Febrile on arrival to 102.4. s/p 2L bolus NS in ED. Lactic acidosis improved from 3.5-2.8.  Most likely cause of sepsis is due to UTI.  Urinary culture is currently pending.  CSF cultures show no WBC and no organisms.  Patient has remained afebrile overnight and vital signs have been stable.  Lactic acid peaked at 3.9 at 430 this morning and patient received 1 L bolus.  Plans to continue empiric treatment with vancomycin, cefepime, Flagyl and acyclovir for at least first 24 hours.  Plans to narrow antibiotics.  Follow up urine, blood and CSF cultures.   Continue Vanc, cefepime and flagyl per pharmacy  vanc trough before 4th dose    ID Curbside for acyclovir recs in setting of zosyn  Increase D5NS 125 mL/hr  Recheck LA  # Hyperglycemia  Likely due to acute infection leading to insulin resistance. Patient is also followed by endocrinology outpatient.   Continue insulin drip for better control of sugars and closer monitoring while patient w/ AMS  Increased D5-1/2 NS 154mL/hr  #Sperm on UA  Patient remains altered this morning and unable to elicit history.   Repeat UA   Follow up urine GC  Social Work Consult   # HTN Hypertensive from 140's-160's systolic.   Holding home meds in setting of NPO.   Consider IV antihypertensives  # HLD  Hold all home meds in setting of NPO.    #GERD  Hold all home meds in setting of NPO.    #CKD  Monitor Creatinine   #Hypothyroidism   Hold all home meds in setting of NPO.    #Chornic Diarrhea  C diff negative in PCP 06/07/18  Imodium PRN   # Other Chronic medical issues  Leutscher's syndrome, positive ANA, primary open angle glaucoma, polyneuropathy, liver cirrhosis?  Several other issues per CareEverywhere.   Patient appears to have good follow up at PCP  Will chart review further after management of acute issues   # FEN . Fluids: 125 mL/hr  . Electrolytes: wnl  . NPO  # DVT  Prophylaxis . Lovenox 30  # Disposition . Trending Labs: CBG, CMP, CBC w/ diff  . Remain on progressive until off gtt Subjective  Patient altered this morning. Attempted to use interperter service and patient seems to understand but still continues to respond inappropriately.   Objective   Vital Signs Intake/Output  Temp:  [98.1 F (36.7 C)-102.4 F (39.1 C)] 98.1 F (36.7 C) (02/10 0605) Pulse Rate:  [86-106] 96 (02/10 0605) Resp:  [20-27] 20 (02/10 0605) BP: (122-165)/(51-114) 144/75 (02/10 0605) SpO2:  [96 %-100 %] 100 % (02/10 0605) Weight:  [66.2 kg] 66.2 kg (02/09 1418)  Filed Weights   06/18/18 1400 06/18/18 1418  Weight: 66.2 kg 66.2 kg   Intake/Output      02/09 0701 - 02/10 0700 02/10 0701 - 02/11 0700   I.V. (mL/kg) 100 (1.5)    IV Piggyback 3100    Total Intake(mL/kg) 3200 (48.3)    Net +3200            Physical Exam  Gen: Lying in bed with eyes deviated to right side  Skin: Warm and dry HEENT: NCAT.  MMM.  CV: RRR.  Normal S1-S2. No BLEE. Resp: CTAB. No increased WOB Abd: Distended, nontender. Tympanic to percussion. + BS.  Neuro: left side of mouth appears to be weakened with speech. Unable to follow commands via interpretor. 2+ LE reflexes. Responds to pain bilaterally.   Laboratory: CBC: Recent Labs  Lab 06/18/18 1306 06/18/18 1715 06/19/18 0747  WBC 14.5*  --  14.3*  NEUTROABS 9.4*  --  9.9*  HGB 11.7* 11.6* 10.0*  HCT 37.2 34.0* 31.4*  MCV 89.2  --  88.5  PLT 228  --  170   Basic Metabolic Panel: Recent Labs  Lab 06/18/18 1306 06/18/18 1715 06/18/18 2128 06/19/18 0223  NA 127* 130* 124* 131*  K 4.5 4.1 3.8 3.3*  CL 94*  --  97* 103  CO2 19*  --  16* 16*  GLUCOSE 453*  --  463* 289*  BUN 26*  --  27* 29*  CREATININE 1.54*  --  1.33* 1.49*  CALCIUM 8.9  --  8.0* 8.3*   GFR: Estimated Creatinine Clearance: 29.1 mL/min (A) (by C-G formula based on SCr of 1.49 mg/dL (H)). Liver Function Tests: Recent Labs  Lab 06/18/18 1306  06/19/18 0223  AST 39 28  ALT 25 21  ALKPHOS 135* 111  BILITOT 1.3* 0.8  PROT 7.6 6.5  ALBUMIN 2.7* 2.5*   No results for input(s): LIPASE, AMYLASE in the last 168 hours. Recent Labs  Lab 06/19/18 0223  AMMONIA 14   Coagulation Profile: Recent Labs  Lab 06/19/18 0747  INR 1.06   CBG: Recent Labs  Lab 06/19/18 0817 06/19/18 0947 06/19/18 1107 06/19/18 1220 06/19/18 1322  GLUCAP 166* 183* 187* 172* 144*   Thyroid Function Tests: Recent Labs    06/19/18 0223  TSH 1.678   Anemia Panel: Recent Labs    06/19/18 0223  VITAMINB12 448   Urine analysis:    Component Value Date/Time   COLORURINE AMBER (A) 06/18/2018 1400   APPEARANCEUR TURBID (  A) 06/18/2018 1400   LABSPEC 1.023 06/18/2018 1400   PHURINE 5.0 06/18/2018 1400   GLUCOSEU >=500 (A) 06/18/2018 1400   HGBUR MODERATE (A) 06/18/2018 1400   BILIRUBINUR NEGATIVE 06/18/2018 1400   KETONESUR NEGATIVE 06/18/2018 1400   PROTEINUR >=300 (A) 06/18/2018 1400   NITRITE NEGATIVE 06/18/2018 1400   LEUKOCYTESUR MODERATE (A) 06/18/2018 1400   Sepsis Labs: Recent Labs  Lab 06/18/18 1805  TROPIPOC 0.02    Imaging/Diagnostic Tests: Ct Angio Head W Or Wo Contrast Result Date: 06/18/2018 IMPRESSION: 1. Patent carotid and vertebral arteries. No dissection, aneurysm, or hemodynamically significant stenosis utilizing NASCET criteria. 2. Patent anterior and posterior intracranial circulation. No large vessel occlusion, aneurysm, or significant stenosis. 3. Aortic, carotid bifurcations, and carotid siphon calcific atherosclerosis without significant stenosis.  Dg Chest 2 View Result Date: 06/18/2018 IMPRESSION: 1. There is hazy density along the left lateral costophrenic angle which is probably anterior in position based on the lateral projection, and most likely due to the patient's epicardial adipose tissue combined with the low lung volumes. Lingular pneumonia is a less likely differential diagnostic consideration.  Correlate with any abnormal breath sounds at the left lung base. 2.  Aortic Atherosclerosis (ICD10-I70.0). 3. Low lung volumes are present, causing crowding of the pulmonary vasculature.  Ct Head Wo Contrast IMPRESSION: 1. No acute intracranial findings. 2. Certain hypervascular 3. 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 4. Atherosclerosis. 5. Mild chronic ethmoid sinusitis.   Ct Angio Neck W Or Wo Contrast Result Date: 06/18/2018 IMPRESSION: 1. Patent carotid and vertebral arteries. No dissection, aneurysm, or hemodynamically significant stenosis utilizing NASCET criteria. 2. Patent anterior and posterior intracranial circulation. No large vessel occlusion, aneurysm, or significant stenosis. 3. Aortic, carotid bifurcations, and carotid siphon calcific atherosclerosis without significant stenosis.   Mr Brain Wo Contrast (neuro Protocol) Result Date: 06/18/2018 IMPRESSION: 1.  No acute intracranial abnormality. 2. Generalized cerebral cortical volume loss with advanced signal changes in the white matter, deep gray matter and pons most commonly due to chronic small vessel disease.   US Abdomen Complete Result Date: 06/18/2018 IMPRESSION: Post cholecystectomy. Incomplete visualization of pancreatic tail. No acute abnormalities identified.     Melene Plan, MD 06/19/2018, 7:13 AM PGY-1, [facility] Family Medicine FPTS Intern pager: [phone], text pages welcome

## 2018-06-19 NOTE — Evaluation (Signed)
Clinical/Bedside Swallow Evaluation Patient Details  Name: Lori Hardy MRN: [id number] Date of Birth: 1940-12-16  Today's Date: 06/19/2018 Time: SLP Start Time (ACUTE ONLY): 1212 SLP Stop Time (ACUTE ONLY): 1233 SLP Time Calculation (min) (ACUTE ONLY): 21 min  Past Medical History:  Past Medical History:  Diagnosis Date  . Aortic atherosclerosis (HCC) 12/15/2015   Overview:  By imaging, xray 11/2015.  . Closed fracture of right proximal humerus 04/06/2017  . Compression fracture of L4 lumbar vertebra, closed, initial encounter (HCC) 01/12/2017  . Diabetes mellitus without complication (HCC)   . Exposure to TB 09/26/2015  . 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  Differential includes diet, medications, possible pancreatic insufficiency.  Negative C. difficile 09/27/2015  . H. pylori infection 10/07/2016   Overview:  BX confirmed[facility]   Marland Kitchen. History of compression fracture of spine 11/20/2015   Overview:  T12, on prolia.  Marland Kitchen. History of Helicobacter pylori infection 11/20/2015   Overview:  By EGD 2017.  [facility]  . Hx of pancreatitis 09/26/2015   Overview:  08/2015 [facility]  . Hypertension   . Hyponatremia 04/18/2017  . 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 No active disease on chest xray 11/25/15. Pt from [place], past exposure to TB. Pt agreed to referral and treatment by [facility]- referral sent 12/15/2015 (telephone [phone], fax #[phone]).   . 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 Pseudophakia 06/11/2015  . Seasonal allergic rhinitis 09/02/2016  . Senile osteoporosis 06/26/2015   Overview:  Bone  density 12/2014 [facility]. T-4.4 and -3.8. On prolia managed by Dr. Allena KatzPatel.  Normal intact PTH in 2017.  Marland Kitchen. Thrombocytopenia (HCC) 09/26/2015  . Xerosis of skin 04/14/2016   Past Surgical History:  Past Surgical History:  Procedure Laterality Date  . cataracts     HPI:  Lori AfricaKamlaben Mcdade is a 78 y.o. female presenting with altered mental status, hyperglycemia and sepsis.  PMH is significant for DM, CKD 3, ACD, HLD, HTN, hypothyroidism, GERD, Leutscher's syndrome, positive ANA, primary open angle glaucoma, polyneuropathy, cirrhosis on imaging. MRI No acute intracranial abnormality. 2. Generalized cerebral cortical volume loss with advanced signal changes in the white matter, deep gray matter and pons most commonly   Assessment / Plan / Recommendation Clinical Impression  Pt is currently too lethargic and confused to safely take po's however prognosis is good once she can maintain alertness with improved awareness. Immediate and strong cough with sips water indicative of likely aspiration. Pt stating she is hungry, wanting to eat but falls back asleep. IF she wakes this afternoon or tonight, RN may clean oral cavity and give ice chips and sips water only (separately). ST will plan to follow up tomorrow.   SLP Visit Diagnosis: Dysphagia, unspecified (R13.10)    Aspiration Risk  Severe aspiration risk    Diet Recommendation Ice chips PRN after oral care        Other  Recommendations Oral Care Recommendations: Oral care QID   Follow up Recommendations (TBA)      Frequency and Duration min 2x/week  2 weeks       Prognosis Prognosis for Safe Diet Advancement: Good      Swallow  Study   General HPI: Annalou Lamberson is a 78 y.o. female presenting with altered mental status, hyperglycemia and sepsis.  PMH is significant for DM, CKD 3, ACD, HLD, HTN, hypothyroidism, GERD, Leutscher's syndrome, positive ANA, primary open angle glaucoma, polyneuropathy, cirrhosis on imaging. MRI No acute intracranial  abnormality. 2. Generalized cerebral cortical volume loss with advanced signal changes in the white matter, deep gray matter and pons most commonly Type of Study: Bedside Swallow Evaluation Previous Swallow Assessment: (none) Diet Prior to this Study: NPO Temperature Spikes Noted: No Respiratory Status: Room air History of Recent Intubation: No Behavior/Cognition: Requires cueing;Lethargic/Drowsy;Doesn't follow directions Oral Cavity Assessment: (unable to view) Oral Care Completed by SLP: No Oral Cavity - Dentition: (suspect adequate ) Self-Feeding Abilities: Total assist Patient Positioning: Upright in bed Baseline Vocal Quality: Normal Volitional Cough: Cognitively unable to elicit Volitional Swallow: Unable to elicit    Oral/Motor/Sensory Function Overall Oral Motor/Sensory Function: Other (comment)(will assess)   Ice Chips Ice chips: Not tested   Thin Liquid Thin Liquid: Impaired Presentation: Cup;Straw Oral Phase Impairments: Reduced labial seal Oral Phase Functional Implications: Right anterior spillage;Left anterior spillage Pharyngeal  Phase Impairments: Cough - Delayed;Cough - Immediate    Nectar Thick Nectar Thick Liquid: Not tested   Honey Thick Honey Thick Liquid: Not tested   Puree Puree: Not tested   Solid     Solid: Not tested      Royce Macadamia 06/19/2018,1:08 PM   Breck Coons [place].Ed Nurse, children's [phone] Office [phone]

## 2018-06-19 NOTE — Progress Notes (Signed)
FPTS Interim Progress Note  Received page from ED nurse at 0130 concerning admission order for progressive. Per ED nurse, patient had transfer orders instead of admission orders. Admission orders placed.   Received page at 0400 concerning critical lactic acid of 3.9. 1L fluid bolus given.   Received another page at 0430 with drop in blood sugar from 151 to 96. Insulin ddt decreased and instructed to increase D5-1/2NS from 50 to 21mL. Repeat CBG in 1 hour.  Did not hear from nurse again. Paged floor nurse. CBG's 57 and 49 from right and left hand respectively. Insullin ddt stopped per protocol, 69mL of d50 given. Fluids still running at 83mL/hr. Instructed to increase D5-1/2NS to 38mL/hr and monitor CBG.  Orpah Cobb [place], DO 06/19/2018, 6:24 AM PGY-1, [facility] Family Medicine Service pager [phone]

## 2018-06-19 NOTE — Progress Notes (Signed)
Called to nurse for repeat UA ordered this AM and patient without UOP. Purewic placed. She has been on continuous fluids.   Bladder scan   In and out cath in >400cc   Notify MD if >400 cc   Genia Hotter, M.D. Family Medicine Residency  PGY-1 [p] [phone]  text pages via amion  06/19/2018, 5:37 PM

## 2018-06-19 NOTE — Progress Notes (Signed)
Spoke to son at bedside today. He reports that patient is improved. He reports that she was unable to recognize him two days ago and when he arrived today, she was able to recognize him and called out to him. Son has been notified of patients current status and treatment plan moving forward, which is to continue IV antibiotics until cultures result. Additionally, he would like for patient to return home rather than discharge to SNF.   He reports that he is available by phone at any time of the day and would like to be called if interpretation is needed as she recognizes his voice and may limit any further confusion in patient. [phone]. He stresses he would like to be called at any hour of the day.   Of note, patient does not report pain.   On exam, hemi neglect is resolved. She is able to lift her left upper extremity. She remains very weak on finger squeeze bilaterally and still has confusion with  commands.   Genia Hotterachel Kim, M.D. Family Medicine Residency  PGY-1 [p] [phone]  text pages via amion  06/19/2018, 3:26 PM

## 2018-06-19 NOTE — Progress Notes (Addendum)
Reason for consult: Seizure  Subjective: Patient is awake today, appears confused.  She follows commands intermittently.  ROS:  Unable to obtain due to poor mental status  Examination  Vital signs in last 24 hours: Temp:  [98.1 F (36.7 C)-102.4 F (39.1 C)] 98.4 F (36.9 C) (02/10 0821) Pulse Rate:  [81-106] 94 (02/10 0821) Resp:  [18-27] 18 (02/10 0821) BP: (122-165)/(51-114) 138/104 (02/10 0821) SpO2:  [96 %-100 %] 100 % (02/10 0821) Weight:  [66.2 kg] 66.2 kg (02/09 1418)  General: lying in bed CVS: pulse-normal rate and rhythm RS: breathing comfortably Extremities: normal   Neuro: MS: Awake, follows commands intermittently.  Speaks incoherently in hindi.  CN: pupils equal and reactive,  EOMI, face symmetric, tongue midline,  Motor: 4/ 5 strength in bilateral upper extremities, withdraws to pain in bilateral lower extremities Reflexes:plantars: flexor Coordination: Unable to assess Gait: not tested  Basic Metabolic Panel: Recent Labs  Lab 06/18/18 1306 06/18/18 1715 06/18/18 2128 06/19/18 0223  NA 127* 130* 124* 131*  K 4.5 4.1 3.8 3.3*  CL 94*  --  97* 103  CO2 19*  --  16* 16*  GLUCOSE 453*  --  463* 289*  BUN 26*  --  27* 29*  CREATININE 1.54*  --  1.33* 1.49*  CALCIUM 8.9  --  8.0* 8.3*    CBC: Recent Labs  Lab 06/18/18 1306 06/18/18 1715 06/19/18 0747  WBC 14.5*  --  14.3*  NEUTROABS 9.4*  --  9.9*  HGB 11.7* 11.6* 10.0*  HCT 37.2 34.0* 31.4*  MCV 89.2  --  88.5  PLT 228  --  170     Coagulation Studies: Recent Labs    06/19/18 0747  LABPROT 13.7  INR 1.06    Imaging Reviewed: MRI brain:  no acute findings, negative for acute infarct  EEG Abnormalities: 1) attenuation of faster frequencies in the right 2) generalized irregular delta activity 3) slow posterior dominant rhythm  Clinical Interpretation: This EEG recorded evidence of a widespread right hemispheric dysfunction superimposed on a more generalized nonspecific cerebral  dysfunction (encephalopathy).    ASSESSMENT AND PLAN  42 y  female presenting with altered mental status, fever of 104, left upper extremity flaccid weakness.  MRI brain showed no acute infarction.  LP performed in the emergency room showed cell count of 8 in the setting of traumatic tap, not suggestive of CNS infection. She has subsequently improved no longer having flaccid left upper extremity weakness.  Seizure with postictal Todd's paresis -Likely provoked in the setting of severe hyperglycemia,hyponatremia, sepsis with underlying brain atrophy  Recommendations -Will start patient on Keppra 500 mg twice daily as EEG shows right hemispheric dysfunction and patient likely increased risk of seizures in the future -Seizure precautions -Continue to treat sepsis, hyperglycemia, hyponatremia   Zariana Strub [facility] Pager Number [phone] For questions after 7pm please refer to AMION to reach the Neurologist on call

## 2018-06-19 NOTE — Progress Notes (Signed)
Inpatient Diabetes Program Recommendations  AACE/ADA: New Consensus Statement on Inpatient Glycemic Control  Target Ranges:  Prepandial:   less than 140 mg/dL      Peak postprandial:   less than 180 mg/dL (1-2 hours)      Critically ill patients:  140 - 180 mg/dL  Results for Lori Hardy, Lori Hardy (MRN [id number]) as of 06/19/2018 11:45  Ref. Range 06/18/2018 23:58 06/19/2018 01:06 06/19/2018 02:18 06/19/2018 03:25 06/19/2018 04:29 06/19/2018 05:54 06/19/2018 05:56 06/19/2018 06:26 06/19/2018 06:49 06/19/2018 08:00 06/19/2018 08:17 06/19/2018 09:47 06/19/2018 11:07  Glucose-Capillary Latest Ref Range: 70 - 99 mg/dL 409392 (H) 811341 (H) 914300 (H) 151 (H) 96 57 (L) 48 (L) 69 (L) 123 (H) 154 (H) 166 (H) 183 (H) 187 (H)   Results for Lori Hardy, Lori Hardy (MRN [id number]) as of 06/19/2018 11:45  Ref. Range 06/18/2018 13:06 06/18/2018 21:28 06/19/2018 02:23  Glucose Latest Ref Range: 70 - 99 mg/dL 086453 (H) 578463 (H) 469289 (H)   Review of Glycemic Control  Diabetes history: DM2 Outpatient Diabetes medications: Basaglar 14 units QHS, Glipizide 10 mg QAM, Actos 45 mg daily Current orders for Inpatient glycemic control: IV insulin drip  Inpatient Diabetes Program Recommendations:  Insulin at transition from IV to SQ:  Once MD is ready to transition from IV to SQ insulin, please consider ordering Lantus 13 units Q24H (based on 66 kg x 0.2 units), CBGs Q4H, and Novolog 0-9 units Q4H.  NOTE: In reviewing chart, noted patient is followed by Dr. Allena KatzPatel (Endocrinologist) and was last seen on 04/04/2018 and per office note A1C was 9.5% on 04/04/2018.    Thanks, Orlando PennerMarie Rilley Stash, RN, MSN, CDE Diabetes Coordinator Inpatient Diabetes Program [phone] (Team Pager from 8am to 5pm)

## 2018-06-19 NOTE — Progress Notes (Signed)
Pt received form ED on insulin drip. Pt confused, said in hindi that she is hungry.. CHG bath completed. Connected to tele. CCMD notified. Pt does not speak english. No family by bedside. Left sided weakness noted. lethargic. Call bell within reach. Will continue to monitor.

## 2018-06-20 DIAGNOSIS — A419 Sepsis, unspecified organism: Secondary | ICD-10-CM | POA: Diagnosis not present

## 2018-06-20 DIAGNOSIS — E1169 Type 2 diabetes mellitus with other specified complication: Secondary | ICD-10-CM

## 2018-06-20 DIAGNOSIS — E1136 Type 2 diabetes mellitus with diabetic cataract: Secondary | ICD-10-CM | POA: Diagnosis not present

## 2018-06-20 DIAGNOSIS — K219 Gastro-esophageal reflux disease without esophagitis: Secondary | ICD-10-CM

## 2018-06-20 DIAGNOSIS — N390 Urinary tract infection, site not specified: Secondary | ICD-10-CM

## 2018-06-20 DIAGNOSIS — R531 Weakness: Secondary | ICD-10-CM | POA: Diagnosis not present

## 2018-06-20 DIAGNOSIS — E785 Hyperlipidemia, unspecified: Secondary | ICD-10-CM

## 2018-06-20 DIAGNOSIS — R569 Unspecified convulsions: Secondary | ICD-10-CM | POA: Diagnosis not present

## 2018-06-20 DIAGNOSIS — Z789 Other specified health status: Secondary | ICD-10-CM

## 2018-06-20 DIAGNOSIS — R4182 Altered mental status, unspecified: Secondary | ICD-10-CM | POA: Diagnosis not present

## 2018-06-20 LAB — BASIC METABOLIC PANEL
Anion gap: 9 (ref 5–15)
BUN: 21 mg/dL (ref 8–23)
CO2: 17 mmol/L — ABNORMAL LOW (ref 22–32)
Calcium: 8 mg/dL — ABNORMAL LOW (ref 8.9–10.3)
Chloride: 108 mmol/L (ref 98–111)
Creatinine, Ser: 1.03 mg/dL — ABNORMAL HIGH (ref 0.44–1.00)
GFR calc Af Amer: >60 mL/min (ref >60)
GFR calc non Af Amer: 52 mL/min — ABNORMAL LOW (ref >60)
GLUCOSE: 192 mg/dL — AB (ref 70–99)
Potassium: 3.8 mmol/L (ref 3.5–5.1)
Sodium: 134 mmol/L — ABNORMAL LOW (ref 135–145)

## 2018-06-20 LAB — GLUCOSE, CAPILLARY
Glucose-Capillary: 128 mg/dL — ABNORMAL HIGH (ref 70–99)
Glucose-Capillary: 131 mg/dL — ABNORMAL HIGH (ref 70–99)
Glucose-Capillary: 147 mg/dL — ABNORMAL HIGH (ref 70–99)
Glucose-Capillary: 153 mg/dL — ABNORMAL HIGH (ref 70–99)
Glucose-Capillary: 154 mg/dL — ABNORMAL HIGH (ref 70–99)
Glucose-Capillary: 159 mg/dL — ABNORMAL HIGH (ref 70–99)
Glucose-Capillary: 172 mg/dL — ABNORMAL HIGH (ref 70–99)
Glucose-Capillary: 173 mg/dL — ABNORMAL HIGH (ref 70–99)
Glucose-Capillary: 175 mg/dL — ABNORMAL HIGH (ref 70–99)
Glucose-Capillary: 175 mg/dL — ABNORMAL HIGH (ref 70–99)
Glucose-Capillary: 181 mg/dL — ABNORMAL HIGH (ref 70–99)
Glucose-Capillary: 182 mg/dL — ABNORMAL HIGH (ref 70–99)
Glucose-Capillary: 187 mg/dL — ABNORMAL HIGH (ref 70–99)
Glucose-Capillary: 190 mg/dL — ABNORMAL HIGH (ref 70–99)
Glucose-Capillary: 193 mg/dL — ABNORMAL HIGH (ref 70–99)
Glucose-Capillary: 209 mg/dL — ABNORMAL HIGH (ref 70–99)

## 2018-06-20 LAB — CBC
HCT: 30.3 % — ABNORMAL LOW (ref 36.0–46.0)
Hemoglobin: 10 g/dL — ABNORMAL LOW (ref 12.0–15.0)
MCH: 29.4 pg (ref 26.0–34.0)
MCHC: 33 g/dL (ref 30.0–36.0)
MCV: 89.1 fL (ref 80.0–100.0)
Platelets: 182 10*3/uL (ref 150–400)
RBC: 3.4 MIL/uL — ABNORMAL LOW (ref 3.87–5.11)
RDW: 15.5 % (ref 11.5–15.5)
WBC: 9.6 10*3/uL (ref 4.0–10.5)
nRBC: 0 % (ref 0.0–0.2)

## 2018-06-20 LAB — HERPES SIMPLEX VIRUS(HSV) DNA BY PCR

## 2018-06-20 LAB — HSV DNA BY PCR (REFERENCE LAB)
HSV 1 DNA: NEGATIVE
HSV 2 DNA: NEGATIVE

## 2018-06-20 MED ORDER — INSULIN REGULAR(HUMAN) IN NACL 100-0.9 UT/100ML-% IV SOLN: INTRAVENOUS | Status: AC

## 2018-06-20 MED ORDER — INSULIN ASPART 100 UNIT/ML ~~LOC~~ SOLN
0.0000 [IU] | Freq: Three times a day (TID) | SUBCUTANEOUS | Status: DC
  Administered 2018-06-20: 2 [IU] via SUBCUTANEOUS
  Administered 2018-06-21: 5 [IU] via SUBCUTANEOUS
  Administered 2018-06-21: 3 [IU] via SUBCUTANEOUS
  Administered 2018-06-21: 7 [IU] via SUBCUTANEOUS
  Administered 2018-06-22 (×3): 3 [IU] via SUBCUTANEOUS

## 2018-06-20 MED ORDER — LEVOTHYROXINE SODIUM 25 MCG PO TABS
25.0000 ug | ORAL_TABLET | Freq: Every day | ORAL | Status: DC
  Administered 2018-06-21 – 2018-06-22 (×2): 25 ug via ORAL
  Filled 2018-06-20 (×2): qty 1

## 2018-06-20 MED ORDER — METOPROLOL TARTRATE 25 MG PO TABS
25.0000 mg | ORAL_TABLET | Freq: Two times a day (BID) | ORAL | Status: DC
  Administered 2018-06-20 – 2018-06-22 (×4): 25 mg via ORAL
  Filled 2018-06-20 (×4): qty 1

## 2018-06-20 MED ORDER — GLUCERNA SHAKE PO LIQD
237.0000 mL | Freq: Three times a day (TID) | ORAL | Status: DC
  Administered 2018-06-20 – 2018-06-22 (×6): 237 mL via ORAL

## 2018-06-20 MED ORDER — ADULT MULTIVITAMIN W/MINERALS CH
1.0000 | ORAL_TABLET | Freq: Every day | ORAL | Status: DC
  Administered 2018-06-21 – 2018-06-22 (×2): 1 via ORAL
  Filled 2018-06-20 (×2): qty 1

## 2018-06-20 MED ORDER — SODIUM CHLORIDE 0.9 % IV SOLN
1.0000 g | INTRAVENOUS | Status: DC
  Administered 2018-06-20 – 2018-06-21 (×2): 1 g via INTRAVENOUS
  Filled 2018-06-20 (×3): qty 10

## 2018-06-20 MED ORDER — INSULIN GLARGINE 100 UNIT/ML ~~LOC~~ SOLN
8.0000 [IU] | Freq: Every day | SUBCUTANEOUS | Status: DC
  Administered 2018-06-20 – 2018-06-21 (×2): 8 [IU] via SUBCUTANEOUS
  Filled 2018-06-20 (×2): qty 0.08

## 2018-06-20 MED ORDER — PANTOPRAZOLE SODIUM 40 MG PO TBEC
40.0000 mg | DELAYED_RELEASE_TABLET | Freq: Every day | ORAL | Status: DC
  Administered 2018-06-21 – 2018-06-22 (×2): 40 mg via ORAL
  Filled 2018-06-20 (×2): qty 1

## 2018-06-20 MED ORDER — INSULIN ASPART 100 UNIT/ML ~~LOC~~ SOLN: 0.0000 [IU] | Freq: Three times a day (TID) | SUBCUTANEOUS | Status: DC

## 2018-06-20 MED ORDER — ROSUVASTATIN CALCIUM 5 MG PO TABS
10.0000 mg | ORAL_TABLET | Freq: Every day | ORAL | Status: DC
  Administered 2018-06-20 – 2018-06-21 (×2): 10 mg via ORAL
  Filled 2018-06-20 (×2): qty 2

## 2018-06-20 MED ORDER — INSULIN ASPART 100 UNIT/ML ~~LOC~~ SOLN
0.0000 [IU] | Freq: Every day | SUBCUTANEOUS | Status: DC
  Administered 2018-06-21: 2 [IU] via SUBCUTANEOUS

## 2018-06-20 NOTE — Progress Notes (Signed)
Infectious disease called this RN, pt with r/o meningitis, thus far cultures have been negative. Per ID pt has been on antibiotics for >24 hours and no longer infectious. Droplet precautions can be d/c per ID.

## 2018-06-20 NOTE — Progress Notes (Addendum)
Patient with seen on bladder scan, pt then with episode of incontinence/  large amount in bed soaking pad and sheets. Pt then re-bladder scanned and obtained on bladder scan. Pt then IN and out cathed as ordered and obtained of clear yellow urine. After completion of in and out cath patient was bladder scanned again and on scan. Pt had full linen changed.  Dr. Selena Batten in room to see patient.Channon Ambrosini, Randall An RN

## 2018-06-20 NOTE — Evaluation (Signed)
Physical Therapy Evaluation Patient Details Name: Lori Hardy MRN: [id number] DOB: Dec 02, 1940 Today's Date: 06/20/2018   History of Present Illness  78 yo female with onset of L hemiparesis and acute encephalopathy due to sepsis was admitted and noted no source for her weakness.  Did have blood in CSF with lumbar puncture, but infection from UTI is blamed for hemiparesis.  PMHx:  chronic diarrhea, hypothyroidism, GERD, CKD, seizures,   Clinical Impression  Pt is up to move from initial bedside posture, but is unable to sit unsupported.  Her ability to sit is hindered by L side weakness but will wait for CIR to work toward completion of this goal.  Follow acutely for progression of gait, limited standing tolerance.  Will work toward independent mobility with RW to control standing balance and increase LLE security in WB.    Follow Up Recommendations CIR    Equipment Recommendations  None recommended by PT    Recommendations for Other Services       Precautions / Restrictions Precautions Precautions: Fall;Other (comment)(telemetry) Restrictions Weight Bearing Restrictions: Yes      Mobility  Bed Mobility Overal bed mobility: Needs Assistance Bed Mobility: Supine to Sit;Sit to Supine     Supine to sit: Mod assist Sit to supine: Mod assist   General bed mobility comments: mod to lift trunk and finish scooting to EOB, mod to lift legs and trunk back.  2 person assist to scoot up in bed  Transfers Overall transfer level: Needs assistance Equipment used: Rolling walker (2 wheeled);2 person hand held assist(third person to bring chair as pt on IV) Transfers: Sit to/from [employer] CorporationStand;Stand Pivot Transfers Sit to Stand: Mod assist Stand pivot transfers: Max assist;From elevated surface(max due to gait fatigue)       General transfer comment: cued sitting balance and control of push to stand  Ambulation/Gait Ambulation/Gait assistance: +2 physical assistance;+2 safety/equipment;Mod  assist Gait Distance (Feet): 12 Feet Assistive device: Rolling walker (2 wheeled);2 person hand held assist Gait Pattern/deviations: Step-to pattern;Step-through pattern;Decreased stride length;Wide base of support;Trunk flexed;Decreased weight shift to left Gait velocity: reduced Gait velocity interpretation: <1.8 ft/sec, indicate of risk for recurrent falls General Gait Details: pt is weak, requires dense cues for safety with both sitting and standing   Stairs            Wheelchair Mobility    Modified Rankin (Stroke Patients Only)       Balance Overall balance assessment: (sitting balance is poor, needs min assist to balance)                                           Pertinent Vitals/Pain Pain Assessment: No/denies pain    Home Living Family/patient expects to be discharged to:: Inpatient rehab                 Additional Comments: home with family but has concerning fall history recently    Prior Function Level of Independence: Independent with assistive device(s)         Comments: uses rollator or SPC usually     Hand Dominance   Dominant Hand: Right    Extremity/Trunk Assessment   Upper Extremity Assessment Upper Extremity Assessment: Generalized weakness    Lower Extremity Assessment Lower Extremity Assessment: Generalized weakness    Cervical / Trunk Assessment Cervical / Trunk Assessment: Kyphotic  Communication   Communication: No difficulties  Cognition  Arousal/Alertness: Awake/alert Behavior During Therapy: WFL for tasks assessed/performed Overall Cognitive Status: No family/caregiver present to determine baseline cognitive functioning(family refused translation but cannot translate all conversa)                                 General Comments: Daughter in law cannot translate well      General Comments General comments (skin integrity, edema, etc.): pt is demonstrating control to sit on side of  bed, but in standing needs two person helpl to avoid listing or buckling on LLE    Exercises     Assessment/Plan    PT Assessment Patient needs continued PT services  PT Problem List Decreased strength;Decreased range of motion;Decreased activity tolerance;Decreased balance;Decreased mobility;Decreased coordination;Decreased cognition;Decreased safety awareness;Decreased knowledge of use of DME;Decreased knowledge of precautions;Cardiopulmonary status limiting activity;Obesity       PT Treatment Interventions DME instruction;Gait training;Functional mobility training;Therapeutic activities;Therapeutic exercise;Balance training;Neuromuscular re-education;Cognitive remediation;Patient/family education    PT Goals (Current goals can be found in the Care Plan section)  Acute Rehab PT Goals Patient Stated Goal: via translation plans to walk PT Goal Formulation: With patient/family Time For Goal Achievement: 07/04/18 Potential to Achieve Goals: Good    Frequency Min 3X/week   Barriers to discharge   home with family but is going to have to get up and walk to get into house    Co-evaluation               AM-PAC PT "6 Clicks" Mobility  Outcome Measure Help needed turning from your back to your side while in a flat bed without using bedrails?: A Little Help needed moving from lying on your back to sitting on the side of a flat bed without using bedrails?: A Little Help needed moving to and from a bed to a chair (including a wheelchair)?: A Lot Help needed standing up from a chair using your arms (e.g., wheelchair or bedside chair)?: A Lot Help needed to walk in hospital room?: A Lot Help needed climbing 3-5 steps with a railing? : A Lot 6 Click Score: 14    End of Session Equipment Utilized During Treatment: Gait belt Activity Tolerance: Patient tolerated treatment well;Patient limited by fatigue;Treatment limited secondary to medical complications (Comment) Patient left: in  bed;with call bell/phone within reach;with bed alarm set;with family/visitor present Nurse Communication: Mobility status PT Visit Diagnosis: Unsteadiness on feet (R26.81);Repeated falls (R29.6);Muscle weakness (generalized) (M62.81);Difficulty in walking, not elsewhere classified (R26.2)    Time: 6010-9323 PT Time Calculation (min) (ACUTE ONLY): 26 min   Charges:   PT Evaluation $PT Eval Moderate Complexity: 1 Mod PT Treatments $Gait Training: 8-22 mins       Ivar Drape 06/20/2018, 4:30 PM   Samul Dada, PT MS Acute Rehab Dept. Number: [facility] [phone] and [facility] [phone]

## 2018-06-20 NOTE — Progress Notes (Signed)
Pt voided 550 ml in the bedside commode, post void bladder scan was 207 ml. Will continue to monitor.

## 2018-06-20 NOTE — Progress Notes (Addendum)
Reason for consult: Seizure  Subjective: Patient is more alert and oriented today.  ROS: negative except above  Examination  Vital signs in last 24 hours: Temp:  [97.3 F (36.3 C)-97.9 F (36.6 C)] 97.8 F (36.6 C) (02/11 0812) Pulse Rate:  [83-95] 88 (02/11 0812) Resp:  [16-19] 19 (02/11 0812) BP: (117-153)/(71-96) 136/73 (02/11 0812) SpO2:  [100 %] 100 % (02/11 0812)  General: lying in bed CVS: pulse-normal rate and rhythm RS: breathing comfortably Extremities: normal   Neuro: MS: Alert, oriented to person, place, slightly confused,  follows commands CN: pupils equal and reactive, visual fields are normal, EOMI, face symmetric, tongue midline, normal sensation over face, Motor: 4/5 strength in all 4 extremities Reflexes: 2+ bilaterally over patella, biceps, plantars: flexor Coordination: normal Gait: not tested  Basic Metabolic Panel: Recent Labs  Lab 06/18/18 1306 06/18/18 1715 06/18/18 2128 06/19/18 0223 06/20/18 0633  NA 127* 130* 124* 131* 134*  K 4.5 4.1 3.8 3.3* 3.8  CL 94*  --  97* 103 108  CO2 19*  --  16* 16* 17*  GLUCOSE 453*  --  463* 289* 192*  BUN 26*  --  27* 29* 21  CREATININE 1.54*  --  1.33* 1.49* 1.03*  CALCIUM 8.9  --  8.0* 8.3* 8.0*    CBC: Recent Labs  Lab 06/18/18 1306 06/18/18 1715 06/19/18 0747 06/20/18 0633  WBC 14.5*  --  14.3* 9.6  NEUTROABS 9.4*  --  9.9*  --   HGB 11.7* 11.6* 10.0* 10.0*  HCT 37.2 34.0* 31.4* 30.3*  MCV 89.2  --  88.5 89.1  PLT 228  --  170 182     Coagulation Studies: Recent Labs    06/19/18 0747  LABPROT 13.7  INR 1.06    Imaging Reviewed:     ASSESSMENT AND PLAN  57 y  female presenting with altered mental status, fever of 104, left upper extremity flaccid weakness.  MRI brain showed no acute infarction.  LP performed in the emergency room showed cell count of 8 in the setting of traumatic tap, not suggestive of CNS infection. She has subsequently improved no longer having flaccid left  upper extremity weakness.  He appears to be tolerating seizure medication and does not appear to be excessively drowsy this morning.  Counseled family that it would be better for her to be on a seizure medication, at least for now- despite seizure being provoked as she is at high risk for having seizures in the future.  Seizure with postictal Todd's paresis, resolved  -Likely provoked in the setting of severe hyperglycemia,hyponatremia, sepsis with underlying brain atrophy  Recommendations -Continue Keppra 500 mg twice daily as EEG shows right hemispheric dysfunction and patient likely increased risk of seizures in the future given cerebral atrophy.  -Seizure precautions -Continue to treat sepsis, hyperglycemia, hyponatremia    Neurology will sign off.  Follow-up with outpatient neurology for management of seizure  Georgiana Spinner Gwendy Boeder [facility] Pager Number [phone] For questions after 7pm please refer to AMION to reach the Neurologist on call

## 2018-06-20 NOTE — Progress Notes (Signed)
  Speech Language Pathology Treatment: Dysphagia  Patient Details Name: Lori Hardy MRN: [id number] DOB: [birth year] Today's Date: 06/20/2018 Time: 4827-0786 SLP Time Calculation (min) (ACUTE ONLY): 25 min  Assessment / Plan / Recommendation Clinical Impression  Pt much more alert today.  Her daughter-in-law is present and translates basic conversation.  Pt oriented to person, place, year.  She demonstrates adequate anticipation and oral recognition; masticates solids with mild residue right lateral sulcus, but with adequate awareness of its presence.  Attempts to self-feed but needs physical assist with hand-to-mouth.  Consumes thin liquids, purees, solids with no overt s/s of aspiration.  Voice is clear throughout assessment; no coughing nor wet phonation. Recommend resuming PO diet - soft solids, thin liquids; give meds whole in puree.  D/W dtr-in-law and RN.  SLP will follow briefly for safety with POs.    HPI HPI: Lori Hardy is a 78 y.o. female presenting with altered mental status, hyperglycemia and sepsis.  PMH is significant for DM, CKD 3, ACD, HLD, HTN, hypothyroidism, GERD, Leutscher's syndrome, positive ANA, primary open angle glaucoma, polyneuropathy, cirrhosis on imaging. MRI No acute intracranial abnormality. 2. Generalized cerebral cortical volume loss with advanced signal changes in the white matter, deep gray matter and pons most commonly      SLP Plan  Continue with current plan of care       Recommendations  Diet recommendations: Dysphagia 3 (mechanical soft);Thin liquid Liquids provided via: Cup;Straw Medication Administration: Whole meds with puree Supervision: Patient able to self feed;Staff to assist with self feeding Compensations: Minimize environmental distractions;Slow rate;Small sips/bites Postural Changes and/or Swallow Maneuvers: Seated upright 90 degrees                Oral Care Recommendations: Oral care BID Follow up Recommendations: None SLP  Visit Diagnosis: Dysphagia, unspecified (R13.10) Plan: Continue with current plan of care       GO                Lori Hardy 06/20/2018, 9:58 AM  Lori Folks L. Samson Frederic, MA CCC/SLP Acute Rehabilitation Services Office number [phone] Pager [phone]

## 2018-06-20 NOTE — Progress Notes (Addendum)
Family Medicine Teaching Service Daily Progress Note Intern Pager: [phone]  Patient name: Lori Hardy Medical record number: [id number] Date of birth: 1940/08/27 Age: 78 y.o. Gender: female  Primary Care Provider: Ron ParkerJenkins, Harvette C, MD (Inactive)  Consultants: Neurology, pharm consults, ID  Code Status: Full Code   Pt Overview and Major Events to Date:  Hospital Day: 3  Admitted: 06/18/2018 for CC: AMS  Assessment and Plan: Lori Hardy is a 78 y.o. right handed female presenting with altered mental status, last known well 2/7 afternoon, hyperglycemia and sepsis.  PMH is significant for DM, CKD 3, ACD, HLD, HTN, hypothyroidism, GERD, Leutscher's syndrome, positive ANA, primary open angle glaucoma, polyneuropathy, cirrhosis on imaging.  # AMS  2/2 Septic/Metabolic Encephalopathy Per neurology, AMS likely due to metabolic cause in the setting of chronic brain atrophy. Speech saw this morning and patient approved fro Dysphagia 3 diet  PO status ? Appreciate SLP recommendations ? Appreciate nutrition recs for D3 diet ? Begin restarting PO medications  ? D/C fluids  Neurology following ? Started IV 500mg  keppra BID on 2/11   Infection treatment as below   Family has been updated. They would like pt to come home by Friday.   PT/OT  # UTI  (Resolved sepsis) Acyclovir d/c'ed yesterday per ID curbside recs.  Repeat lactic acid 2.1. 125mL mIVF NS ordered yesterday on top of 13800mL/hr D5 NS. Dc'ed NS this morning.   Follow up blood and CSF cultures.   Transition to IV CTX    DC fluids   #Afib overnight Likely due to sepsis, as not in PMHx.  Continue to monitor   If Afib, EKG, IV/PO rate control   # IDDM Received 1 unit/hour over last 24 hours. Will dose 8 units lantus and mSSI TID w/ meals. No standing mealtime at this time.   DC drip today   New DM orders   Lantus 8 units   CBG TID WC + nighttime   Transfer patient to tele   DC D51/2NS and start Regular NS.    #Urinary retention and incontinence  In and out cathed x2. Extra fluids on yesterday.    q8 bladder scan   Place foley on third positive scan.   #Sperm on UA  Patient remains altered this morning and unable to elicit history. Repeat UA without   Follow up urine GC   Call micro bench about sperm lab and UGC update   Social Work Consult   #Left upper extremity edema  Present on admission, worsened likely in setting of double mIVF.   Monitor on physical exam  # HTN Normotensive   Plans to restart meds today   # HLD  Plans to restart meds today     #GERD Plans to restart meds today   # AKI on CKD Improving Cr to 1.03 today   Monitor Creatinine - daily BMP   #Hypothyroidism  Plans to restart meds today   #Chornic Diarrhea  stable C diff negative in PCP 06/07/18   # Other Chronic medical issues  Leutscher's syndrome, positive ANA, primary open angle glaucoma, polyneuropathy, liver cirrhosis?  Several other issues per CareEverywhere.   Patient appears to have good follow up at PCP  Will chart review further after management of acute issues   Plans to restart meds today   # FEN . Fluids: 100 mL/hr D5NS . Electrolytes: wnl  . NPO  # DVT Prophylaxis . Lovenox 40  # Disposition . Trending Labs: CBG, CMP, CBC w/ diff  .  Transfer patient to tele Subjective  Patient altered this morning. Called son to update and for interpretation help. Patient without complaints this morning and in no pain.   Objective   Vital Signs Intake/Output  Temp:  [97.3 F (36.3 C)-97.9 F (36.6 C)] 97.8 F (36.6 C) (02/11 0812) Pulse Rate:  [83-95] 88 (02/11 0812) Resp:  [16-19] 19 (02/11 0812) BP: (117-153)/(71-96) 136/73 (02/11 0812) SpO2:  [100 %] 100 % (02/11 0812)  Filed Weights   06/18/18 1400 06/18/18 1418  Weight: 66.2 kg 66.2 kg   Intake/Output      02/10 0701 - 02/11 0700 02/11 0701 - 02/12 0700   P.O. 0    I.V. (mL/kg) 3526.2 (53.3) 1100.8 (16.6)    IV Piggyback 775.9 50.5   Total Intake(mL/kg) 4302.1 (65) 1151.2 (17.4)   Urine (mL/kg/hr) 200 (0.1) 600 (3.6)   Total Output 200 600   Net +4102.1 +551.2        Urine Occurrence  1 x      Physical Exam  Gen: Lying in bed comfortably  Skin: Warm and dry HEENT: NCAT.  MMM.  CV: RRR.  Normal S1-S2. No BLEE. Resp: CTAB. No increased WOB Abd: Distended, nontender. Tympanic to percussion. + BS.  Extremities: LUE edema with pitting to hand Neuro: speech slurred while talking on phone to son. Reaches and holds phone with left hand.   Laboratory: CBC: Recent Labs  Lab 06/18/18 1306 06/18/18 1715 06/19/18 0747 06/20/18 0633  WBC 14.5*  --  14.3* 9.6  NEUTROABS 9.4*  --  9.9*  --   HGB 11.7* 11.6* 10.0* 10.0*  HCT 37.2 34.0* 31.4* 30.3*  MCV 89.2  --  88.5 89.1  PLT 228  --  170 182   Basic Metabolic Panel: Recent Labs  Lab 06/18/18 1306 06/18/18 1715 06/18/18 2128 06/19/18 0223 06/20/18 0633  NA 127* 130* 124* 131* 134*  K 4.5 4.1 3.8 3.3* 3.8  CL 94*  --  97* 103 108  CO2 19*  --  16* 16* 17*  GLUCOSE 453*  --  463* 289* 192*  BUN 26*  --  27* 29* 21  CREATININE 1.54*  --  1.33* 1.49* 1.03*  CALCIUM 8.9  --  8.0* 8.3* 8.0*   GFR: Estimated Creatinine Clearance: 42.1 mL/min (A) (by C-G formula based on SCr of 1.03 mg/dL (H)). Liver Function Tests: Recent Labs  Lab 06/18/18 1306 06/19/18 0223  AST 39 28  ALT 25 21  ALKPHOS 135* 111  BILITOT 1.3* 0.8  PROT 7.6 6.5  ALBUMIN 2.7* 2.5*   No results for input(s): LIPASE, AMYLASE in the last 168 hours. Recent Labs  Lab 06/19/18 0223  AMMONIA 14   Coagulation Profile: Recent Labs  Lab 06/19/18 0747  INR 1.06   CBG: Recent Labs  Lab 06/20/18 0506 06/20/18 0603 06/20/18 0700 06/20/18 0808 06/20/18 0900  GLUCAP 173* 182* 159* 175* 153*   Thyroid Function Tests: Recent Labs    06/19/18 0223  TSH 1.678   Anemia Panel: Recent Labs    06/19/18 0223  VITAMINB12 448   Urine analysis:     Component Value Date/Time   COLORURINE YELLOW 06/19/2018 1855   APPEARANCEUR TURBID (A) 06/19/2018 1855   LABSPEC 1.016 06/19/2018 1855   PHURINE 5.0 06/19/2018 1855   GLUCOSEU >=500 (A) 06/19/2018 1855   HGBUR MODERATE (A) 06/19/2018 1855   BILIRUBINUR NEGATIVE 06/19/2018 1855   KETONESUR NEGATIVE 06/19/2018 1855   PROTEINUR 100 (A) 06/19/2018 1855   NITRITE NEGATIVE  06/19/2018 1855   LEUKOCYTESUR LARGE (A) 06/19/2018 1855   Sepsis Labs: Recent Labs  Lab 06/18/18 1805  TROPIPOC 0.02    Imaging/Diagnostic Tests: Ct Angio Head W Or Wo Contrast Result Date: 06/18/2018 IMPRESSION: 1. Patent carotid and vertebral arteries. No dissection, aneurysm, or hemodynamically significant stenosis utilizing NASCET criteria. 2. Patent anterior and posterior intracranial circulation. No large vessel occlusion, aneurysm, or significant stenosis. 3. Aortic, carotid bifurcations, and carotid siphon calcific atherosclerosis without significant stenosis.  Dg Chest 2 View Result Date: 06/18/2018 IMPRESSION: 1. There is hazy density along the left lateral costophrenic angle which is probably anterior in position based on the lateral projection, and most likely due to the patient's epicardial adipose tissue combined with the low lung volumes. Lingular pneumonia is a less likely differential diagnostic consideration. Correlate with any abnormal breath sounds at the left lung base. 2.  Aortic Atherosclerosis (ICD10-I70.0). 3. Low lung volumes are present, causing crowding of the pulmonary vasculature.  Ct Head Wo Contrast IMPRESSION: 1. No acute intracranial findings. 2. Certain hypervascular 3. 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 4. Atherosclerosis. 5. Mild chronic ethmoid sinusitis.   Ct Angio Neck W Or Wo Contrast Result Date: 06/18/2018 IMPRESSION: 1. Patent carotid and vertebral arteries. No dissection, aneurysm, or hemodynamically  significant stenosis utilizing NASCET criteria. 2. Patent anterior and posterior intracranial circulation. No large vessel occlusion, aneurysm, or significant stenosis. 3. Aortic, carotid bifurcations, and carotid siphon calcific atherosclerosis without significant stenosis.   Mr Brain Wo Contrast (neuro Protocol) Result Date: 06/18/2018 IMPRESSION: 1.  No acute intracranial abnormality. 2. Generalized cerebral cortical volume loss with advanced signal changes in the white matter, deep gray matter and pons most commonly due to chronic small vessel disease.   US Abdomen Complete Result Date: 06/18/2018 IMPRESSION: Post cholecystectomy. Incomplete visualization of pancreatic tail. No acute abnormalities identified.     Melene Plan, MD 06/20/2018, 9:30 AM PGY-1, [facility] Family Medicine FPTS Intern pager: [phone], text pages welcome

## 2018-06-20 NOTE — Discharge Summary (Signed)
Family Medicine [facility] Discharge Summary  Patient name: Shaaron Golliday Medical record number: [id number] Date of birth: December 16, 1940 Age: 78 y.o. 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)   Diabetes mellitus (HCC)   Hyperlipidemia associated with type 2 diabetes mellitus (HCC)   Gastro-esophageal reflux disease without esophagitis   Hyperlipidemia   Hypertension   Language barrier to communication   Luetscher's syndrome   Polyneuropathy due to type 2 diabetes mellitus (HCC)   Positive ANA (antinuclear antibody)   Posterior capsular opacification of right eye, obscuring vision   Primary open-angle glaucoma   Unspecified cirrhosis of liver (HCC)   Weight loss   Sepsis (HCC)   Anemia of chronic disease   CKD (chronic kidney disease), stage II   Hypertensive crisis  Disposition: Discharge to CIR  Discharge Condition: Medically stable  Discharge Exam:  BP (!) 162/67 (BP Location: Right Arm)   Pulse 72   Temp 98 F (36.7 C) (Oral)   Resp 18   Ht 5\' 4"  (1.626 m)   Wt 66.2 kg   SpO2 100%   BMI 25.06 kg/m   Physical Exam  Gen: Lying in bed comfortably, alert - speaking [place], saying "bathroom"  Skin: Warm and dry HEENT: NCAT.  MMM.  CV: RRR.  Normal S1-S2. Resp: CTAB. No increased WOB Abd: Distended, nontender. Tympanic to percussion. + BS.  Neuro: No FND. Gait not appreciated.   Brief Hospital Course:  Teri Legacy is a 78 y.o. female with past medical history significant for HTN, HLD, CKD3, IDDM, GERD, anemia and hypothyroidism, who  presented with altered mental status. Initial physical exam showed left hemineglect, left sided paralysis and inability to follow commands. Initial work up with significant for lactic acidosis, leukocytosis, hyperglycemia without anion gap. Image reviewed included CXR, abdominal US which were both negative. CTA, MRI and CT head w/o were all negative for acute findings. EEG was obtained and consistent with encephalopathy and no seizure activity.  Full admission work up consistent with septic/metabolic encephalitis 2/2 acute E. Faecalis UTI. Patient was initially managed with fluids and insulin drip for hyperglycemia for better glycemic control in setting of AMS. All oral medications were held on admission in setting of AMS.  AMS: Patient was seen by neurology who read the EEG and started patient on IV Keppra 500 mg twice daily as neurology believed that she was at increased risk of seizure.  This was transitioned to p.o. Keppra.  She will continue on this medication and will require outpatient follow-up with neurology. CSF and Blood cultures remained negative.  UTI:  Patient was on cefepime, vancomycin, Flagyl on 2/9 to 2/10 and transition to IV ceftriaxone from 2/11 to 2/13.  Patient was transitioned to amoxicillin p.o. once sensitivities returned.  She should continue amoxicillin until 2/15 for total 10-day course for UTI.  Patient was also noted to have some incontinence and retain urine during her admission requiring 2 in and out caths.  She never required Foley and she started to have spontaneous voids. HTN:  Initially held home medications, then restarted on home metoprolol 25mg  initially. On the night before discharge, amlodipine  5mg  was restarted. On the morning of discharge, losartan 50 mg added as patient was noted to be hypertensive. Home dose of losartan is 100mg , which should be added back. Patient may benefit from further up titration of these medications.  IDDM:  At home, patient on glipizide  and pioglitazone, as well as Lantus 14 units.  Glipizide and p.o. glitazone were both discontinued due to n.p.o. status and were not restarted at discharge.  Patient was started on 2 units of NovoLog at mealtime with bedtime checks, as well as sensitive sliding scale 3 times daily WC &  Nightly.  Patient's glucose remained high during her admission and she would benefit from continued titration of her Lantus and NovoLog during her time at CIR.   Of note, on the evening of 2/11, patient was noted to have an episode of A. fib over telemetry.  She did not have any recurrent events and it was assumed to be due to sepsis. Additionally, urinalysis on admission showed sperm present. Called back to lab, and likely mucous and not sperm and lab made addendum result comment. UGC was sent as patient was having AMS. This was still pending at time of d/c.   Issues for Follow Up:  1. Symptom improvement of AMS  2. Needs neurology follow up for addition of keppra during admission  3. Complete UTI abx with amoxicillin  4. Titrate losartan back to home 100mg  daily dose. Can consider of HTN management as patient does not have heart failure.  5. Discontinued pioglitazone and glipizide.  1. Continue lantus, uptitrate as needed  2. Consider starting other oral medication for DM 2   Significant Procedures:   Procedure Orders     Lumbar Puncture     Critical Care     ED EKG 12-Lead     EKG 12-Lead  Significant Labs and Imaging:  Recent Labs  Lab 06/20/18 0633 06/21/18 0325 06/22/18 0944  WBC 9.6 6.8 8.1  HGB 10.0* 9.2* 10.4*  HCT 30.3* 28.9* 32.1*  PLT 182 153 179   Recent Labs  Lab 06/18/18 1306  06/18/18 2128 06/19/18 0223 06/20/18 0633 06/21/18 0325 06/22/18 0944  NA 127*   < > 124* 131* 134* 134* 134*  K 4.5   < > 3.8 3.3* 3.8 3.6 3.2*  CL 94*  --  97* 103 108 106 104  CO2 19*  --  16* 16* 17* 17* 22  GLUCOSE 453*  --  463* 289* 192* 326* 242*  BUN 26*  --  27* 29* 21 14 8   CREATININE  1.54*  --  1.33* 1.49* 1.03* 0.98 0.98  CALCIUM 8.9  --  8.0* 8.3* 8.0* 8.1* 8.3*  ALKPHOS 135*  --   --  111  --   --   --   AST 39  --   --  28  --   --   --   ALT 25  --   --  21  --   --   --   ALBUMIN 2.7*  --   --  2.5*  --   --   --    < > = values in this interval not displayed.    Ct Angio Head W Or Wo Contrast  Result Date: 06/18/2018 CLINICAL DATA:  78 y/o  F; altered mental status. EXAM: CT ANGIOGRAPHY HEAD AND NECK TECHNIQUE: Multidetector CT imaging of the head and neck was performed using the standard protocol during bolus administration of intravenous contrast. Multiplanar CT image  reconstructions and MIPs were obtained to evaluate the vascular anatomy. Carotid stenosis measurements (when applicable) are obtained utilizing NASCET criteria, using the distal internal carotid diameter as the denominator. CONTRAST:  58mL ISOVUE-370 IOPAMIDOL (ISOVUE-370) INJECTION 76% COMPARISON:  06/18/2018 CT head and MRI head. FINDINGS: CTA NECK FINDINGS Aortic arch: Bovine variant branching. Imaged portion shows no evidence of aneurysm or dissection. No significant stenosis of the major arch vessel origins. Aortic calcific atherosclerosis. Right carotid system: No evidence of dissection, stenosis (50% or greater) or occlusion. Non stenotic calcified plaque of the carotid bifurcation. Left carotid system: No evidence of dissection, stenosis (50% or greater) or occlusion. Non stenotic calcified plaque of the carotid bifurcation. Vertebral arteries: Left dominant. No evidence of dissection, stenosis (50% or greater) or occlusion. Skeleton: Negative. Other neck: 7 mm nodule in the right lobe of the thyroid gland. Upper chest: Negative. Review of the MIP images confirms the above findings CTA HEAD FINDINGS Anterior circulation: No significant stenosis, proximal occlusion, aneurysm, or vascular malformation. Calcific atherosclerosis of carotid siphons with up to mild less than 50% stenosis. Posterior circulation:  No significant stenosis, proximal occlusion, aneurysm, or vascular malformation. Venous sinuses: As permitted by contrast timing, patent. Anatomic variants: Left fetal PCA. Delayed phase: No abnormal intracranial enhancement. Review of the MIP images confirms the above findings IMPRESSION: 1. Patent carotid and vertebral arteries. No dissection, aneurysm, or hemodynamically significant stenosis utilizing NASCET criteria. 2. Patent anterior and posterior intracranial circulation. No large vessel occlusion, aneurysm, or significant stenosis. 3. Aortic, carotid bifurcations, and carotid siphon calcific atherosclerosis without significant stenosis. Electronically Signed   By: Mitzi Hansen M.D.   On: 06/18/2018 23:12   Dg Chest 2 View  Result Date: 06/18/2018 CLINICAL DATA:  Weakness and headache EXAM: CHEST - 2 VIEW COMPARISON:  CT chest 06/07/2018 FINDINGS: Atherosclerotic calcification of the aortic arch. Low lung volumes are present, causing crowding of the pulmonary vasculature. Hazy density along the left lateral costophrenic angle. The lungs appear otherwise clear. IMPRESSION: 1. There is hazy density along the left lateral costophrenic angle which is probably anterior in position based on the lateral projection, and most likely due to the patient's epicardial adipose tissue combined with the low lung volumes. Lingular pneumonia is a less likely differential diagnostic consideration. Correlate with any abnormal breath sounds at the left lung base. 2.  Aortic Atherosclerosis (ICD10-I70.0). 3. Low lung volumes are present, causing crowding of the pulmonary vasculature. Electronically Signed   By: Gaylyn Rong M.D.   On: 06/18/2018 14:05   Ct Head Wo Contrast  Result Date: 06/18/2018 CLINICAL DATA:  Decreased activity, left arm weakness EXAM: CT HEAD WITHOUT CONTRAST TECHNIQUE: Contiguous axial images were obtained from the base of the skull through the vertex without intravenous contrast.  COMPARISON:  04/18/2017 FINDINGS: Brain: Remote lacunar infarct posteriorly along the lentiform nucleus. Small remote lacunar infarct in the head of the left caudate nucleus. Periventricular white matter and corona radiata hypodensities favor chronic ischemic microvascular white matter disease. The brainstem and cerebellum appear unremarkable. No significant ventricular abnormality. No intracranial hemorrhage, mass lesion, or acute CVA. Vascular: There is atherosclerotic calcification of the cavernous carotid arteries bilaterally. Skull: Unremarkable Sinuses/Orbits: Mild chronic ethmoid sinusitis. Other: No supplemental non-categorized findings. IMPRESSION: 1. No acute intracranial findings. 2. Certain hypervascular 3. 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 4. Atherosclerosis. 5. Mild chronic ethmoid sinusitis. Electronically Signed   By: Gaylyn Rong M.D.   On: 06/18/2018 14:02  Ct Angio Neck W Or Wo Contrast  Result Date: 06/18/2018 CLINICAL DATA:  78 y/o  F; altered mental status. EXAM: CT ANGIOGRAPHY HEAD AND NECK TECHNIQUE: Multidetector CT imaging of the head and neck was performed using the standard protocol during bolus administration of intravenous contrast. Multiplanar CT image reconstructions and MIPs were obtained to evaluate the vascular anatomy. Carotid stenosis measurements (when applicable) are obtained utilizing NASCET criteria, using the distal internal carotid diameter as the denominator. CONTRAST:  32mL ISOVUE-370 IOPAMIDOL (ISOVUE-370) INJECTION 76% COMPARISON:  06/18/2018 CT head and MRI head. FINDINGS: CTA NECK FINDINGS Aortic arch: Bovine variant branching. Imaged portion shows no evidence of aneurysm or dissection. No significant stenosis of the major arch vessel origins. Aortic calcific atherosclerosis. Right carotid system: No evidence of dissection, stenosis (50% or greater) or occlusion. Non stenotic calcified  plaque of the carotid bifurcation. Left carotid system: No evidence of dissection, stenosis (50% or greater) or occlusion. Non stenotic calcified plaque of the carotid bifurcation. Vertebral arteries: Left dominant. No evidence of dissection, stenosis (50% or greater) or occlusion. Skeleton: Negative. Other neck: 7 mm nodule in the right lobe of the thyroid gland. Upper chest: Negative. Review of the MIP images confirms the above findings CTA HEAD FINDINGS Anterior circulation: No significant stenosis, proximal occlusion, aneurysm, or vascular malformation. Calcific atherosclerosis of carotid siphons with up to mild less than 50% stenosis. Posterior circulation: No significant stenosis, proximal occlusion, aneurysm, or vascular malformation. Venous sinuses: As permitted by contrast timing, patent. Anatomic variants: Left fetal PCA. Delayed phase: No abnormal intracranial enhancement. Review of the MIP images confirms the above findings IMPRESSION: 1. Patent carotid and vertebral arteries. No dissection, aneurysm, or hemodynamically significant stenosis utilizing NASCET criteria. 2. Patent anterior and posterior intracranial circulation. No large vessel occlusion, aneurysm, or significant stenosis. 3. Aortic, carotid bifurcations, and carotid siphon calcific atherosclerosis without significant stenosis. Electronically Signed   By: Mitzi Hansen M.D.   On: 06/18/2018 23:12   Mr Brain Wo Contrast (neuro Protocol)  Result Date: 06/18/2018 CLINICAL DATA:  78 year old female with altered mental status, fever, left arm weakness. EXAM: MRI HEAD WITHOUT CONTRAST TECHNIQUE: Multiplanar, multiecho pulse sequences of the brain and surrounding structures were obtained without intravenous contrast. COMPARISON:  Head CT without contrast earlier today. FINDINGS: Brain: No restricted diffusion to suggest acute infarction. No midline shift, mass effect, evidence of mass lesion, ventriculomegaly, extra-axial collection  or acute intracranial hemorrhage. Cervicomedullary junction and pituitary are within normal limits. Patchy and confluent bilateral cerebral white matter T2 and FLAIR hyperintensity. Widespread cerebral cortical volume loss, but no definite cortical encephalomalacia. No chronic cerebral blood products identified. Similar generalized T2 heterogeneity in the bilateral basal ganglia and pons. Vascular: Major intracranial vascular flow voids are preserved. Skull and upper cervical spine: Negative visible cervical spine. Normal bone marrow signal. Sinuses/Orbits: Postoperative changes to both globes, otherwise negative orbits. Paranasal Visualized paranasal sinuses and mastoids are stable and well pneumatized. Other: Visible internal auditory structures appear normal. Scalp and face soft tissues appear negative. IMPRESSION: 1.  No acute intracranial abnormality. 2. Generalized cerebral cortical volume loss with advanced signal changes in the white matter, deep gray matter and pons most commonly due to chronic small vessel disease. Electronically Signed   By: Odessa Fleming M.D.   On: 06/18/2018 18:52   US Abdomen Complete  Result Date: 06/18/2018 CLINICAL DATA:  Abdominal pain, elevated bilirubin, BUN, creatinine, and alkaline phosphatase EXAM: ABDOMEN ULTRASOUND COMPLETE COMPARISON:  CT abdomen and pelvis 06/07/2018 FINDINGS: Gallbladder: Post cholecystectomy. Common  bile duct: Diameter: 3 mm, normal Liver: Normal appearance without mass or nodularity. Portal vein is patent on color Doppler imaging with normal direction of blood flow towards the liver. IVC: Normal appearance Pancreas: Head, body and proximal tail normal appearance, distal tail obscured by bowel gas Spleen: Normal appearance, 5.8 cm length Right Kidney: Length: 10.1 cm. Normal morphology without mass or hydronephrosis. Left Kidney: Length: 10.2 cm. Normal morphology without mass or hydronephrosis. Abdominal aorta: Visualized portion normal caliber, distally  obscured Other findings: No free fluid IMPRESSION: Post cholecystectomy. Incomplete visualization of pancreatic tail. No acute abnormalities identified. Electronically Signed   By: Ulyses SouthwardMark  Boles M.D.   On: 06/18/2018 20:05    Results/Tests Pending at Time of Discharge:  . Blood cx: NGTD . CSF cx: NGTD . Urine GC- pending  Discharge Medications:  Allergies as of 06/22/2018      Reactions   Brimonidine Itching   Redness and itching eye Redness and itching Redness and itching      Medication List    STOP taking these medications   glipiZIDE 10 MG tablet Commonly known as:  GLUCOTROL   pioglitazone 45 MG tablet Commonly known as:  ACTOS     TAKE these medications   amLODipine 5 MG tablet Commonly known as:  NORVASC Take 5 mg by mouth daily.   amoxicillin 500 MG capsule Commonly known as:  AMOXIL Take 1 capsule (500 mg total) by mouth every 8 (eight) hours for 2 days.   BASAGLAR KWIKPEN 100 UNIT/ML Sopn Inject 14 Units into the skin at bedtime.   diphenoxylate-atropine 2.5-0.025 MG tablet Commonly known as:  LOMOTIL Take 1 tablet by mouth 4 (four) times daily as needed.   dorzolamide 2 % ophthalmic solution Commonly known as:  TRUSOPT Place 1 drop into the right eye 2 (two) times daily.   esomeprazole 40 MG capsule Commonly known as:  NEXIUM Take 40 mg daily at 12 noon by mouth.   insulin aspart 100 UNIT/ML injection Commonly known as:  novoLOG Inject 0-5 Units into the skin at bedtime.   insulin aspart 100 UNIT/ML injection Commonly known as:  novoLOG Inject 0-9 Units into the skin 3 (three) times daily with meals.   insulin aspart 100 UNIT/ML injection Commonly known as:  novoLOG Inject 3 Units into the skin 3 (three) times daily with meals.   latanoprost 0.005 % ophthalmic solution Commonly known as:  XALATAN Place 1 drop into both eyes at bedtime.   levETIRAcetam 500 MG tablet Commonly known as:  KEPPRA Take 1 tablet (500 mg total) by mouth 2 (two)  times daily.   levothyroxine 25 MCG tablet Commonly known as:  SYNTHROID, LEVOTHROID Take 25 mcg daily before breakfast by mouth.   loratadine 10 MG tablet Commonly known as:  CLARITIN Take 10 mg by mouth daily.   losartan 100 MG tablet Commonly known as:  COZAAR Take 0.5 tablets (50 mg total) by mouth daily. What changed:  how much to take   Melatonin 1 MG Tabs Take 1 mg by mouth at bedtime.   metoprolol tartrate 25 MG tablet Commonly known as:  LOPRESSOR Take 25 mg by mouth 2 (two) times daily.   multivitamin with minerals Tabs tablet Take 1 tablet by mouth daily. Start taking on:  June 23, 2018   ondansetron 8 MG tablet Commonly known as:  ZOFRAN Take every 8 (eight) hours as needed by mouth for nausea or vomiting.   rosuvastatin 10 MG tablet Commonly known as:  CRESTOR Take 10  mg by mouth at bedtime.   timolol 0.5 % ophthalmic solution Commonly known as:  TIMOPTIC Place 1 drop into the right eye daily.   traMADol 50 MG tablet Commonly known as:  ULTRAM Take 50 mg by mouth every 6 (six) hours as needed for pain.   Vitamin D3 25 MCG (1000 UT) Caps Take 1,000 mg by mouth daily.       Discharge Instructions: Please refer to Patient Instructions section of EMR for full details.  Patient was counseled important signs and symptoms that should prompt return to medical care, changes in medications, dietary instructions, activity restrictions, and follow up appointments.   Follow-Up Appointments: No future appointments.   Melene PlanKim, Rachel E, MD 06/22/2018, 2:14 PM PGY-1, [facility]

## 2018-06-20 NOTE — Progress Notes (Signed)
Initial Nutrition Assessment  Late Entry for 2018/07/08  DOCUMENTATION CODES:   Not applicable  INTERVENTION:   -MVI with minerals daily -Glucerna Shake po TID, each supplement provides 220 kcal and 10 grams of protein -Downgrade diet to dysphagia 3 (advanced mechanical soft) for ease of intake  NUTRITION DIAGNOSIS:   Inadequate oral intake related to lethargy/confusion as evidenced by meal completion < 25%.  GOAL:   Patient will meet greater than or equal to 90% of their needs  MONITOR:   PO intake, Supplement acceptance, Diet advancement, Labs, Weight trends, Skin, I & O's  REASON FOR ASSESSMENT:   Low Braden    ASSESSMENT:   78 year old woman with history of type 2 diabetes, chronic kidney disease, hypothyroidism, vision impairment, dyslipidemia, anemia of chronic disease, and hyperaldosteronism presenting with altered mental status likely secondary to sepsis of urinary origin.  Pt admitted with AMS related to sepsis and lt sided weakness.   2/10- s/p BSE- recommend continued NPO due to lethargy 2022/07/08- s/p BSE- recommend advancement to dysphagia 3 diet with thin liquids  Pt unable to provide history. No family at bedside to provide additional history.   Per wt hx, wt has been stable.   Meal completion has been poor; noted PO 0-10%. Pt is currently ordered a soft diet, which is a surgical soft diet (low fiber/low residue) intended for pts under going GI surgery and/or with GI complications. Will downgrade diet to dysphagia 3 diet (advaced mechanical soft) for ease of intake).   No recent Hgb A1c available to assess. PTA DM medications 14 units insulin glargine q HS, 10 mg glipizide q AM.  Labs reviewed: Na: 134, CBGS: 153-175 (inpatient orders for glycemic control are 0-5 units insulin aspart q HS, 0-9 units insulin aspart TID with meals, and 8 units insulin glargine daily). Pt transitioned off insulin drip.   NUTRITION - FOCUSED PHYSICAL EXAM:    Most Recent Value   Orbital Region  No depletion  Upper Arm Region  No depletion  Thoracic and Lumbar Region  No depletion  Buccal Region  No depletion  Temple Region  No depletion  Clavicle Bone Region  No depletion  Clavicle and Acromion Bone Region  No depletion  Scapular Bone Region  No depletion  Dorsal Hand  No depletion  Patellar Region  No depletion  Anterior Thigh Region  No depletion  Posterior Calf Region  No depletion  Edema (RD Assessment)  None  Hair  Reviewed  Eyes  Reviewed  Mouth  Reviewed  Skin  Reviewed  Nails  Reviewed       Diet Order:   Diet Order            DIET SOFT Room service appropriate? Yes; Fluid consistency: Thin  Diet effective now              EDUCATION NEEDS:   Not appropriate for education at this time  Skin:  Skin Assessment: Reviewed RN Assessment  Last BM:  PTA  Height:   Ht Readings from Last 1 Encounters:  06/18/18 5\' 4"  (1.626 m)    Weight:   Wt Readings from Last 1 Encounters:  06/18/18 66.2 kg    Ideal Body Weight:  54.5 kg  BMI:  Body mass index is 25.06 kg/m.  Estimated Nutritional Needs:   Kcal:  1750-1950  Protein:  90-105 grams  Fluid:  1.7-1.9 L    Lilliana Turner A. Mayford Knife, RD, LDN, CDE Pager: [phone] After hours Pager: [phone]

## 2018-06-20 NOTE — Progress Notes (Signed)
Inpatient Diabetes Program Recommendations  AACE/ADA: New Consensus Statement on Inpatient Glycemic Control  Target Ranges:  Prepandial:   less than 140 mg/dL      Peak postprandial:   less than 180 mg/dL (1-2 hours)      Critically ill patients:  140 - 180 mg/dL  Results for Gennaro AfricaEL, Christean (MRN [id number]) as of 06/20/2018 10:38  Ref. Range 06/20/2018 00:07 06/20/2018 01:09 06/20/2018 02:07 06/20/2018 03:07 06/20/2018 04:10 06/20/2018 05:06 06/20/2018 06:03 06/20/2018 07:00 06/20/2018 08:08 06/20/2018 09:00 06/20/2018 10:06  Glucose-Capillary Latest Ref Range: 70 - 99 mg/dL 409128 (H) 811131 (H) 914147 (H) 175 (H) 172 (H) 173 (H) 182 (H) 159 (H) 175 (H) 153 (H) 154 (H)   Results for Gennaro AfricaEL, Ayris (MRN [id number]) as of 06/19/2018 11:45  Ref. Range 06/18/2018 13:06 06/18/2018 21:28 06/19/2018 02:23  Glucose Latest Ref Range: 70 - 99 mg/dL 086453 (H) 578463 (H) 469289 (H)   Review of Glycemic Control  Diabetes history: DM2 Outpatient Diabetes medications: Basaglar 14 units QHS, Glipizide 10 mg QAM, Actos 45 mg daily Current orders for Inpatient glycemic control: IV insulin drip  Inpatient Diabetes Program Recommendations:  Insulin at transition from IV to SQ:  Once MD is ready to transition from IV to SQ insulin, please consider ordering Lantus 10 units Q24H (based on 66 kg x 0.15 units), CBGs Q4H, and Novolog 0-9 units Q4H.  NOTE: In reviewing chart, noted patient is followed by Dr. Allena KatzPatel (Endocrinologist) and was last seen on 04/04/2018 and per office note A1C was 9.5% on 04/04/2018.    Thanks, Orlando PennerMarie Verlie Liotta, RN, MSN, CDE Diabetes Coordinator Inpatient Diabetes Program [phone] (Team Pager from 8am to 5pm)

## 2018-06-21 DIAGNOSIS — I169 Hypertensive crisis, unspecified: Secondary | ICD-10-CM

## 2018-06-21 DIAGNOSIS — E1169 Type 2 diabetes mellitus with other specified complication: Secondary | ICD-10-CM | POA: Diagnosis not present

## 2018-06-21 DIAGNOSIS — I10 Essential (primary) hypertension: Secondary | ICD-10-CM | POA: Diagnosis not present

## 2018-06-21 DIAGNOSIS — A4159 Other Gram-negative sepsis: Secondary | ICD-10-CM | POA: Diagnosis not present

## 2018-06-21 DIAGNOSIS — D631 Anemia in chronic kidney disease: Secondary | ICD-10-CM

## 2018-06-21 DIAGNOSIS — N183 Chronic kidney disease, stage 3 unspecified: Secondary | ICD-10-CM

## 2018-06-21 DIAGNOSIS — D638 Anemia in other chronic diseases classified elsewhere: Secondary | ICD-10-CM

## 2018-06-21 DIAGNOSIS — N182 Chronic kidney disease, stage 2 (mild): Secondary | ICD-10-CM

## 2018-06-21 DIAGNOSIS — E039 Hypothyroidism, unspecified: Secondary | ICD-10-CM | POA: Diagnosis not present

## 2018-06-21 DIAGNOSIS — R4182 Altered mental status, unspecified: Secondary | ICD-10-CM | POA: Diagnosis not present

## 2018-06-21 DIAGNOSIS — R531 Weakness: Secondary | ICD-10-CM | POA: Diagnosis not present

## 2018-06-21 DIAGNOSIS — E1136 Type 2 diabetes mellitus with diabetic cataract: Secondary | ICD-10-CM | POA: Diagnosis not present

## 2018-06-21 LAB — CBC
HCT: 28.9 % — ABNORMAL LOW (ref 36.0–46.0)
Hemoglobin: 9.2 g/dL — ABNORMAL LOW (ref 12.0–15.0)
MCH: 28.8 pg (ref 26.0–34.0)
MCHC: 31.8 g/dL (ref 30.0–36.0)
MCV: 90.6 fL (ref 80.0–100.0)
NRBC: 0 % (ref 0.0–0.2)
Platelets: 153 10*3/uL (ref 150–400)
RBC: 3.19 MIL/uL — AB (ref 3.87–5.11)
RDW: 15.6 % — ABNORMAL HIGH (ref 11.5–15.5)
WBC: 6.8 10*3/uL (ref 4.0–10.5)

## 2018-06-21 LAB — URINALYSIS, ROUTINE W REFLEX MICROSCOPIC
Bilirubin Urine: NEGATIVE
Glucose, UA: >=500 mg/dL — AB
Ketones, ur: NEGATIVE mg/dL
Nitrite: NEGATIVE
Protein, ur: >=300 mg/dL — AB
Specific Gravity, Urine: 1.023 (ref 1.005–1.030)
WBC, UA: >50 WBC/hpf — ABNORMAL HIGH (ref 0–5)
pH: 5 (ref 5.0–8.0)

## 2018-06-21 LAB — BASIC METABOLIC PANEL WITH GFR
Anion gap: 11 (ref 5–15)
BUN: 14 mg/dL (ref 8–23)
CO2: 17 mmol/L — ABNORMAL LOW (ref 22–32)
Calcium: 8.1 mg/dL — ABNORMAL LOW (ref 8.9–10.3)
Chloride: 106 mmol/L (ref 98–111)
Creatinine, Ser: 0.98 mg/dL (ref 0.44–1.00)
GFR calc Af Amer: >60 mL/min
GFR calc non Af Amer: 55 mL/min — ABNORMAL LOW
Glucose, Bld: 326 mg/dL — ABNORMAL HIGH (ref 70–99)
Potassium: 3.6 mmol/L (ref 3.5–5.1)
Sodium: 134 mmol/L — ABNORMAL LOW (ref 135–145)

## 2018-06-21 LAB — GLUCOSE, CAPILLARY
GLUCOSE-CAPILLARY: 347 mg/dL — AB (ref 70–99)
Glucose-Capillary: 272 mg/dL — ABNORMAL HIGH (ref 70–99)
Glucose-Capillary: 215 mg/dL — ABNORMAL HIGH (ref 70–99)
Glucose-Capillary: 247 mg/dL — ABNORMAL HIGH (ref 70–99)

## 2018-06-21 LAB — HEMOGLOBIN A1C
Hgb A1c MFr Bld: 9.7 % — ABNORMAL HIGH (ref 4.8–5.6)
MEAN PLASMA GLUCOSE: 231.69 mg/dL

## 2018-06-21 LAB — CSF CULTURE W GRAM STAIN
Culture: NO GROWTH
Gram Stain: NONE SEEN

## 2018-06-21 MED ORDER — AMLODIPINE BESYLATE 5 MG PO TABS
5.0000 mg | ORAL_TABLET | Freq: Every day | ORAL | Status: DC
  Administered 2018-06-21 – 2018-06-22 (×2): 5 mg via ORAL
  Filled 2018-06-21 (×2): qty 1

## 2018-06-21 MED ORDER — LEVETIRACETAM 500 MG PO TABS
500.0000 mg | ORAL_TABLET | Freq: Two times a day (BID) | ORAL | Status: DC
  Administered 2018-06-21 – 2018-06-22 (×2): 500 mg via ORAL
  Filled 2018-06-21 (×2): qty 1

## 2018-06-21 MED ORDER — ENOXAPARIN SODIUM 40 MG/0.4ML ~~LOC~~ SOLN
40.0000 mg | Freq: Every day | SUBCUTANEOUS | Status: DC
  Administered 2018-06-21: 40 mg via SUBCUTANEOUS
  Filled 2018-06-21: qty 0.4

## 2018-06-21 MED ORDER — INSULIN GLARGINE 100 UNIT/ML ~~LOC~~ SOLN
14.0000 [IU] | Freq: Every day | SUBCUTANEOUS | Status: DC
  Administered 2018-06-22: 14 [IU] via SUBCUTANEOUS
  Filled 2018-06-21: qty 0.14

## 2018-06-21 MED ORDER — INSULIN ASPART 100 UNIT/ML ~~LOC~~ SOLN
3.0000 [IU] | Freq: Three times a day (TID) | SUBCUTANEOUS | Status: AC
  Administered 2018-06-21 – 2018-06-22 (×3): 3 [IU] via SUBCUTANEOUS

## 2018-06-21 NOTE — Progress Notes (Signed)
Rehab Admissions Coordinator Note:  Patient was screened by Trish Mage for appropriateness for an Inpatient Acute Rehab Consult.  At this time, we are recommending Inpatient Rehab consult.  Trish Mage 06/21/2018, 7:53 AM  I can be reached at [phone].

## 2018-06-21 NOTE — Consult Note (Addendum)
Physical Medicine and Rehabilitation Consult Reason for Consult: Left side weakness Referring Physician:   Family medicine   HPI: Lori Hardy is a 78 y.o.right handed non-English-speaking female with history of type 2 diabetes mellitus, hyperlipidemia, anemia of chronic disease,CKDstage II. Per chart review patient lives with son. Used a walker prior to admission. Son and family work during the day.  Daughter-in-law available.  Son provided simple meal preparation prior to going to work for patient. Presented  06/18/2018 with complaints of headache,altered mental status, left-sided weakness and vomiting. Noted fever 104,, lactic acid 3.5. Cranial CT scan negative for acute changes. Noted small remote lacunar infarction posteriorly in the right lentiform nucleus and left caudate head. MRI negative for acute changes. CT angiogram of head and neck with no dissection or aneurysm. Lumbar puncture completed glucose elevated at 200, protein 55 WBC of 8. Ultrasound the abdomen negative. Blood cultures no growth to date. Presently maintained on empiric antibiotics for UTI. EEG negative. Remains on Keppra for seizure prophylaxis. Neurology follow-up suspect septic metabolic encephalopathy.  Hospital course further complicated by hypertensive crisis.  Subcutaneous Lovenox for DVT prophylaxis. Therapy evaluation completed with recommendations of physical medicine rehabilitation consult.   Review of Systems  HENT: Negative for hearing loss.   Eyes: Negative for blurred vision and double vision.  Respiratory: Negative for cough and shortness of breath.   Cardiovascular: Negative for chest pain and palpitations.  Gastrointestinal: Positive for nausea and vomiting.  Genitourinary: Negative for dysuria, flank pain and hematuria.  Musculoskeletal: Positive for myalgias.  Skin: Negative for rash.  Neurological: Positive for dizziness and headaches.  All other systems reviewed and are negative.  Past  Medical History:  Diagnosis Date  . Aortic atherosclerosis (HCC) 12/15/2015   Overview:  By imaging, xray 11/2015.  . Closed fracture of right proximal humerus 04/06/2017  . Compression fracture of L4 lumbar vertebra, closed, initial encounter (HCC) 01/12/2017  . Diabetes mellitus without complication (HCC)   . Exposure to TB 09/26/2015  . 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  Differential includes diet, medications, possible pancreatic insufficiency.  Negative C. difficile 09/27/2015  . H. pylori infection 10/07/2016   Overview:  BX confirmed. 09/15/2016- Dr. Noe Gens, [facility].   Marland Kitchen History of compression fracture of spine 11/20/2015   Overview:  T12, on prolia.  Marland Kitchen History of Helicobacter pylori infection 11/20/2015   Overview:  By EGD 2017.  Treated with 2 courses of antibiotics at [facility].  . Hx of pancreatitis 09/26/2015   Overview:  08/2015 [facility]  . Hypertension   . Hyponatremia 04/18/2017  . 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 No active disease on chest xray 11/25/15. Pt from [place], past exposure to TB. Pt agreed to referral and treatment by [facility]- referral sent 12/15/2015 (telephone [phone], fax #[phone]).   . 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 Pseudophakia 06/11/2015  . Seasonal allergic rhinitis 09/02/2016  . Senile osteoporosis 06/26/2015   Overview:  Bone density 12/2014 [facility]. T-4.4 and -3.8. On prolia managed by Dr. Allena Katz.  Normal intact PTH in 2017.  Marland Kitchen Thrombocytopenia (HCC) 09/26/2015  . Xerosis of skin 04/14/2016   Past Surgical History:  Procedure Laterality Date  . cataracts  No pertinent family history of metabolic  encephalopathy. Social History:  reports that she has never smoked. She has never used smokeless tobacco. No history on file for alcohol and drug. Allergies:  Allergies  Allergen Reactions  . Brimonidine Itching    Redness and itching eye Redness and itching Redness and itching    Medications Prior to Admission  Medication Sig Dispense Refill  . amLODipine (NORVASC) 5 MG tablet Take 5 mg by mouth daily.     . Cholecalciferol (VITAMIN D3) 25 MCG (1000 UT) CAPS Take 1,000 mg by mouth daily.    . diphenoxylate-atropine (LOMOTIL) 2.5-0.025 MG tablet Take 1 tablet by mouth 4 (four) times daily as needed.    . dorzolamide (TRUSOPT) 2 % ophthalmic solution Place 1 drop into the right eye 2 (two) times daily.    Marland Kitchen esomeprazole (NEXIUM) 40 MG capsule Take 40 mg daily at 12 noon by mouth.    Marland Kitchen glipiZIDE (GLUCOTROL) 10 MG tablet Take 10 mg daily before breakfast by mouth.    . Insulin Glargine (BASAGLAR KWIKPEN) 100 UNIT/ML SOPN Inject 14 Units into the skin at bedtime.    Marland Kitchen latanoprost (XALATAN) 0.005 % ophthalmic solution Place 1 drop into both eyes at bedtime.    Marland Kitchen levothyroxine (SYNTHROID, LEVOTHROID) 25 MCG tablet Take 25 mcg daily before breakfast by mouth.    . loratadine (CLARITIN) 10 MG tablet Take 10 mg by mouth daily.    Marland Kitchen losartan (COZAAR) 100 MG tablet Take 100 mg daily by mouth.    . Melatonin 1 MG TABS Take 1 mg by mouth at bedtime.    . metoprolol tartrate (LOPRESSOR) 25 MG tablet Take 25 mg by mouth 2 (two) times daily.    . ondansetron (ZOFRAN) 8 MG tablet Take every 8 (eight) hours as needed by mouth for nausea or vomiting.    . pioglitazone (ACTOS) 45 MG tablet Take 45 mg daily by mouth.    . rosuvastatin (CRESTOR) 10 MG tablet Take 10 mg by mouth at bedtime.     . timolol (TIMOPTIC) 0.5 % ophthalmic solution Place 1 drop into the right eye daily.    . traMADol (ULTRAM) 50 MG tablet Take 50 mg by mouth every 6 (six) hours as needed for pain.      Home: Home  Living Family/patient expects to be discharged to:: Inpatient rehab Additional Comments: Pt's daughter in law expresses concern as family is not available 24/7 at home.  Functional History: Prior Function Level of Independence: Independent with assistive device(s) Comments: uses rollator or SPC usually Functional Status:  Mobility: Bed Mobility Overal bed mobility: Needs Assistance Bed Mobility: Supine to Sit, Sit to Supine Supine to sit: Mod assist Sit to supine: Mod assist General bed mobility comments: mod to lift trunk and finish scooting to EOB, mod to lift legs and trunk back.  2 person assist to scoot up in bed Transfers Overall transfer level: Needs assistance Equipment used: Rolling walker (2 wheeled) Transfers: Sit to/from Stand Sit to Stand: Mod assist Stand pivot transfers: Max assist, From elevated surface(max due to gait fatigue) General transfer comment: cued sitting balance and control of push to stand Ambulation/Gait Ambulation/Gait assistance: +2 physical assistance, +2 safety/equipment, Mod assist Gait Distance (Feet): 12 Feet Assistive device: Rolling walker (2 wheeled), 2 person hand held assist Gait Pattern/deviations: Step-to pattern, Step-through pattern, Decreased stride length, Wide base of support, Trunk flexed, Decreased weight shift to left General Gait Details: pt is weak, requires dense cues for safety with both  sitting and standing  Gait velocity: reduced Gait velocity interpretation: <1.8 ft/sec, indicate of risk for recurrent falls    ADL: ADL Overall ADL's : Needs assistance/impaired Eating/Feeding: Set up, Bed level Grooming: Minimal assistance, Bed level Upper Body Bathing: Moderate assistance Lower Body Bathing: Maximal assistance Upper Body Dressing : Moderate assistance Lower Body Dressing: Maximal assistance Toilet Transfer: (DNT) Toilet Transfer Details (indicate cue type and reason): unable to perform today due to nausea and  plopping back onto bed with lateral leans and posterior lean. Functional mobility during ADLs: Maximal assistance, Rolling walker(maxA for power up and for stabilizing) General ADL Comments: Increased time and effort required; unable to perform without mod to maxA  Cognition: Cognition Overall Cognitive Status: Impaired/Different from baseline Orientation Level: Oriented to person, Disoriented to time, Disoriented to situation, Oriented to place Cognition Arousal/Alertness: Lethargic Behavior During Therapy: Flat affect Overall Cognitive Status: Impaired/Different from baseline Area of Impairment: Following commands, Safety/judgement, Awareness, Problem solving Following Commands: Follows one step commands with increased time Safety/Judgement: Decreased awareness of safety, Decreased awareness of deficits General Comments: Daughter in law cannot translate well  Blood pressure (!) 167/75, pulse (!) 103, temperature 97.8 F (36.6 C), temperature source Oral, resp. rate 19, height 5\' 4"  (1.626 m), weight 66.2 kg, SpO2 100 %. Physical Exam  Constitutional: She appears well-developed and well-nourished.  HENT:  Head: Normocephalic and atraumatic.  Eyes: EOM are normal. Right eye exhibits no discharge. Left eye exhibits no discharge.  Neck: Normal range of motion. Neck supple.  Cardiovascular: Normal rate and regular rhythm.  Respiratory: Effort normal and breath sounds normal.  GI: Soft. Bowel sounds are normal.  Musculoskeletal:     Comments: No edema or tenderness in extremities  Neurological: She is alert.  Patient is alert non-English-speaking.  Son at bedside.  Inconsistently follows some simple demonstrated commands.  Motor: Limited, due to participation, but appears to be 4-/5 grossly throughout Alert and confused.  Skin: Skin is warm and dry.  Psychiatric: Her mood appears anxious. Her speech is tangential. Cognition and memory are impaired.    Results for orders placed or  performed during the hospital encounter of 06/18/18 (from the past 24 hour(s))  Glucose, capillary     Status: Abnormal   Collection Time: 06/20/18  4:56 PM  Result Value Ref Range   Glucose-Capillary 193 (H) 70 - 99 mg/dL  Glucose, capillary     Status: Abnormal   Collection Time: 06/20/18  9:05 PM  Result Value Ref Range   Glucose-Capillary 190 (H) 70 - 99 mg/dL  Glucose, capillary     Status: Abnormal   Collection Time: 06/20/18  9:24 PM  Result Value Ref Range   Glucose-Capillary 187 (H) 70 - 99 mg/dL  Basic metabolic panel     Status: Abnormal   Collection Time: 06/21/18  3:25 AM  Result Value Ref Range   Sodium 134 (L) 135 - 145 mmol/L   Potassium 3.6 3.5 - 5.1 mmol/L   Chloride 106 98 - 111 mmol/L   CO2 17 (L) 22 - 32 mmol/L   Glucose, Bld 326 (H) 70 - 99 mg/dL   BUN 14 8 - 23 mg/dL   Creatinine, Ser 1.610.98 0.44 - 1.00 mg/dL   Calcium 8.1 (L) 8.9 - 10.3 mg/dL   GFR calc non Af Amer 55 (L) >60 mL/min   GFR calc Af Amer >60 >60 mL/min   Anion gap 11 5 - 15  CBC     Status: Abnormal   Collection Time:  06/21/18  3:25 AM  Result Value Ref Range   WBC 6.8 4.0 - 10.5 K/uL   RBC 3.19 (L) 3.87 - 5.11 MIL/uL   Hemoglobin 9.2 (L) 12.0 - 15.0 g/dL   HCT 16.128.9 (L) 09.636.0 - 04.546.0 %   MCV 90.6 80.0 - 100.0 fL   MCH 28.8 26.0 - 34.0 pg   MCHC 31.8 30.0 - 36.0 g/dL   RDW 40.915.6 (H) 81.111.5 - 91.415.5 %   Platelets 153 150 - 400 K/uL   nRBC 0.0 0.0 - 0.2 %  Glucose, capillary     Status: Abnormal   Collection Time: 06/21/18  6:24 AM  Result Value Ref Range   Glucose-Capillary 347 (H) 70 - 99 mg/dL  Glucose, capillary     Status: Abnormal   Collection Time: 06/21/18 11:20 AM  Result Value Ref Range   Glucose-Capillary 272 (H) 70 - 99 mg/dL   Comment 1 Notify RN    No results found.  Assessment/Plan: Diagnosis: Metabolic encephalopathy Labs independently reviewed.  Records reviewed and summated above.  1. Does the need for close, 24 hr/day medical supervision in concert with the patient's  rehab needs make it unreasonable for this patient to be served in a less intensive setting? Yes  2. Co-Morbidities requiring supervision/potential complications:  type 2 DM (Monitor in accordance with exercise and adjust meds as necessary), hyperlipidemia, anemia of chronic disease (repeat labs, transfuse to ensure appropriate perfusion for increased activity tolerance),CKD stage II (avoid nephrotoxic meds), hypertensive crisis (monitor and provide prns in accordance with increased physical exertion and pain), hyponatremia (continue to monitor, treat if necessary) 3. Due to safety, disease management, medication administration and patient education, does the patient require 24 hr/day rehab nursing? Yes 4. Does the patient require coordinated care of a physician, rehab nurse, PT (1-2 hrs/day, 5 days/week), OT (1-2 hrs/day, 5 days/week) and SLP (1-2 hrs/day, 5 days/week) to address physical and functional deficits in the context of the above medical diagnosis(es)? Yes Addressing deficits in the following areas: balance, endurance, locomotion, strength, transferring, bathing, dressing, toileting, cognition and psychosocial support 5. Can the patient actively participate in an intensive therapy program of at least 3 hrs of therapy per day at least 5 days per week? Yes 6. The potential for patient to make measurable gains while on inpatient rehab is excellent 7. Anticipated functional outcomes upon discharge from inpatient rehab are supervision  with PT, supervision with OT, supervision with SLP. 8. Estimated rehab length of stay to reach the above functional goals is: 14-18 days. 9. Anticipated D/C setting: Home 10. Anticipated post D/C treatments: HH therapy and Home excercise program 11. Overall Rehab/Functional Prognosis: excellent and good  RECOMMENDATIONS: This patient's condition is appropriate for continued rehabilitative care in the following setting: CIR when medically appropriate. Patient has  agreed to participate in recommended program. Potentially Note that insurance prior authorization may be required for reimbursement for recommended care.  Comment: Rehab Admissions Coordinator to follow up.   I have personally performed a face to face diagnostic evaluation, including, but not limited to relevant history and physical exam findings, of this patient and developed relevant assessment and plan.  Additionally, I have reviewed and concur with the physician assistant's documentation above.   Maryla MorrowAnkit Suitt, MD, ABPMR Mcarthur Rossettianiel J Angiulli, PA-C 06/21/2018

## 2018-06-21 NOTE — Progress Notes (Signed)
Per Dr. Constance Goltz, no more bladder scans needed. Patient voiding regularly.

## 2018-06-21 NOTE — Evaluation (Signed)
Occupational Therapy Evaluation Patient Details Name: Lori Hardy MRN: [id number] DOB: 06-06-1940 Today's Date: 06/21/2018    History of Present Illness 78 yo female with onset of L hemiparesis and acute encephalopathy due to sepsis was admitted and noted no source for her weakness.  Did have blood in CSF with lumbar puncture, but infection from UTI is blamed for hemiparesis.  PMHx:  chronic diarrhea, hypothyroidism, GERD, CKD, seizures,    Clinical Impression   Pt PTA: living with family and independent with mobility and ADL. Pt  Currently limited by weakness and BLEs giving out requiring increased assist for transfers and minimal mobility with RW. Pt limited with ADL to modA for UB ADL and maxA for lower body ADL. Pt unable to properly care for self with new significant weakness and incoordination. Pt would greatly benefit from continued OT skilled services for ADL, mobility and safety acutely and post acutely.     Follow Up Recommendations  CIR;Supervision/Assistance - 24 hour    Equipment Recommendations  3 in 1 bedside commode    Recommendations for Other Services       Precautions / Restrictions Precautions Precautions: Fall;Other (comment)(telemetry) Restrictions Weight Bearing Restrictions: Yes      Mobility Bed Mobility Overal bed mobility: Needs Assistance Bed Mobility: Supine to Sit;Sit to Supine     Supine to sit: Mod assist Sit to supine: Mod assist   General bed mobility comments: mod to lift trunk and finish scooting to EOB, mod to lift legs and trunk back.  2 person assist to scoot up in bed  Transfers Overall transfer level: Needs assistance Equipment used: Rolling walker (2 wheeled) Transfers: Sit to/from Stand Sit to Stand: Mod assist         General transfer comment: cued sitting balance and control of push to stand    Balance Overall balance assessment: Needs assistance Sitting-balance support: Bilateral upper extremity supported Sitting  balance-Leahy Scale: Fair       Standing balance-Leahy Scale: Poor                             ADL either performed or assessed with clinical judgement   ADL Overall ADL's : Needs assistance/impaired Eating/Feeding: Set up;Bed level   Grooming: Minimal assistance;Bed level   Upper Body Bathing: Moderate assistance   Lower Body Bathing: Maximal assistance   Upper Body Dressing : Moderate assistance   Lower Body Dressing: Maximal assistance   Toilet Transfer: (DNT) Toilet Transfer Details (indicate cue type and reason): unable to perform today due to nausea and plopping back onto bed with lateral leans and posterior lean.         Functional mobility during ADLs: Maximal assistance;Rolling walker(maxA for power up and for stabilizing) General ADL Comments: Increased time and effort required; unable to perform without mod to maxA     Vision         Perception     Praxis      Pertinent Vitals/Pain Pain Assessment: Faces Faces Pain Scale: No hurt     Hand Dominance Right   Extremity/Trunk Assessment Upper Extremity Assessment Upper Extremity Assessment: Generalized weakness;LUE deficits/detail LUE Deficits / Details: Increased weakness   Lower Extremity Assessment Lower Extremity Assessment: Generalized weakness;Defer to PT evaluation   Cervical / Trunk Assessment Cervical / Trunk Assessment: Kyphotic   Communication Communication Communication: Prefers language other than English   Cognition Arousal/Alertness: Lethargic Behavior During Therapy: Flat affect Overall Cognitive Status: Impaired/Different from baseline  Area of Impairment: Following commands;Safety/judgement;Awareness;Problem solving                       Following Commands: Follows one step commands with increased time Safety/Judgement: Decreased awareness of safety;Decreased awareness of deficits         General Comments  Pt plopping onto bed from standing after pt  standing and leaning to left side    Exercises     Shoulder Instructions      Home Living Family/patient expects to be discharged to:: Inpatient rehab                                 Additional Comments: Pt's daughter in law expresses concern as family is not available 24/7 at home.      Prior Functioning/Environment Level of Independence: Independent with assistive device(s)        Comments: uses rollator or SPC usually        OT Problem List: Decreased strength;Decreased range of motion;Impaired balance (sitting and/or standing);Decreased coordination;Impaired UE functional use;Pain      OT Treatment/Interventions: Self-care/ADL training;Neuromuscular education;Energy conservation;Balance training;DME and/or AE instruction    OT Goals(Current goals can be found in the care plan section) Acute Rehab OT Goals Patient Stated Goal: via translation, pt wants to return home. OT Goal Formulation: With patient/family Time For Goal Achievement: 07/05/18 Potential to Achieve Goals: Good ADL Goals Pt Will Perform Grooming: with set-up Pt Will Perform Upper Body Dressing: with modified independence;sitting Pt Will Perform Lower Body Dressing: with set-up;sitting/lateral leans;sit to/from stand Pt Will Transfer to Toilet: with min assist;bedside commode Pt/caregiver will Perform Home Exercise Program: Both right and left upper extremity;With written HEP provided Additional ADL Goal #1: Pt will perform ADL functional mobility with minA with RW for stability.  OT Frequency: Min 2X/week   Barriers to D/C: Decreased caregiver support          Co-evaluation              AM-PAC OT "6 Clicks" Daily Activity     Outcome Measure Help from another person eating meals?: A Little Help from another person taking care of personal grooming?: A Lot Help from another person toileting, which includes using toliet, bedpan, or urinal?: Total Help from another person bathing  (including washing, rinsing, drying)?: A Lot Help from another person to put on and taking off regular upper body clothing?: A Lot Help from another person to put on and taking off regular lower body clothing?: Total 6 Click Score: 11   End of Session Equipment Utilized During Treatment: Rolling walker;Gait belt Nurse Communication: Mobility status  Activity Tolerance: Patient limited by fatigue;Treatment limited secondary to medical complications (Comment) Patient left: in bed;with call bell/phone within reach;with bed alarm set;with family/visitor present  OT Visit Diagnosis: Unsteadiness on feet (R26.81);Muscle weakness (generalized) (M62.81)                Time: [id number] OT Time Calculation (min): 26 min Charges:  OT General Charges $OT Visit: 1 Visit OT Evaluation $OT Eval Moderate Complexity: 1 Mod OT Treatments $Neuromuscular Re-education: 8-22 mins  Revonda StandardAllison Cecil Cranker(Jelenek) [place] OTR/L Acute Rehabilitation Services Pager: [phone] Office: [phone]   Sandrea HughsLLYSON  JELENEK 06/21/2018, 12:30 PM

## 2018-06-21 NOTE — Progress Notes (Signed)
  Speech Language Pathology Treatment: Dysphagia  Patient Details Name: Lori Hardy MRN: [id number] DOB: 02-23-1941 Today's Date: 06/21/2018 Time: 6063-0160 SLP Time Calculation (min) (ACUTE ONLY): 10 min  Assessment / Plan / Recommendation Clinical Impression  Pt consumed regular solids and thin liquids with mildly prolonged mastication and Min cues for use of liquid wash. Her son is present and requesting to translate. He denies any overt difficulty with intake today, and only one delayed cough was observed across intake with SLP. Would continue with current diet and precautions. SLP to f/u for tolerance.    HPI HPI: Lori Hardy is a 78 y.o. female presenting with altered mental status, hyperglycemia and sepsis.  PMH is significant for DM, CKD 3, ACD, HLD, HTN, hypothyroidism, GERD, Leutscher's syndrome, positive ANA, primary open angle glaucoma, polyneuropathy, cirrhosis on imaging. MRI No acute intracranial abnormality. 2. Generalized cerebral cortical volume loss with advanced signal changes in the white matter, deep gray matter and pons most commonly      SLP Plan  Continue with current plan of care       Recommendations  Diet recommendations: Dysphagia 3 (mechanical soft);Thin liquid Liquids provided via: Cup;Straw Medication Administration: Whole meds with puree Supervision: Patient able to self feed;Staff to assist with self feeding Compensations: Minimize environmental distractions;Slow rate;Small sips/bites Postural Changes and/or Swallow Maneuvers: Seated upright 90 degrees                Oral Care Recommendations: Oral care BID Follow up Recommendations: None SLP Visit Diagnosis: Dysphagia, unspecified (R13.10) Plan: Continue with current plan of care       GO                Virl Axe Lori Hardy 06/21/2018, 4:44 PM  Natalia Leatherwood, M.A. CCC-SLP Acute Herbalist [phone] Office [phone]

## 2018-06-21 NOTE — Progress Notes (Signed)
Interpreter called at this time. ID # [id number]. Patient was mistaking interpreter for family member and asking when he was coming. Interpreter told her several times that he was not her family. Patient requested water from her bedside and this nurse gave her some. Patient states that she has no other needs at this time.

## 2018-06-21 NOTE — Progress Notes (Signed)
Family Medicine Teaching Service Daily Progress Note Intern Pager: [phone]  Patient name: Lori Hardy Medical record number: [id number] Date of birth: 1940-09-03 Age: 78 y.o. Gender: female  Primary Care Provider: Ron ParkerJenkins, Harvette C, MD (Inactive)  Consultants: Neurology, pharm consults, ID  Code Status: Full Code   Pt Overview and Major Events to Date:  Hospital Day: 4  Admitted: 06/18/2018 for CC: AMS  Assessment and Plan: Lori Hardy is a 78 y.o. right handed female presenting with altered mental status, last known well 2/7 afternoon, hyperglycemia and sepsis.  PMH is significant for DM, CKD 3, ACD, HLD, HTN, hypothyroidism, GERD, Leutscher's syndrome, positive ANA, primary open angle glaucoma, polyneuropathy, cirrhosis on imaging.  # AMS  2/2 Septic/Metabolic Encephalopathy Per neurology, AMS likely due to metabolic cause in the setting of chronic brain atrophy w/ possible seizure.   Switch IV Keppra to PO 500mg  BID  ? Pt will need o/p f/u   Infection treatment as below   PT/OT ? recs for CIR ? Son agrees for formal consult to CIR  #Sperm on UA   Likely misread - please see comment on 2/9 results  # Urosepsis  Urinary cultures remain unidentified. Blood and CSF cx NGTD. S/p cefepime+vanc+flayl x 2 doses (2/9-2/10)  Continue IV CTX (2/11 - 2/15) for total 7 days course  Follow up blood and CSF cultures.   #Afib overnight Likely due to sepsis, as not in PMHx. No recurrent events  Continue to monitor on tele  # IDDM Patient with improving neuro status and eating more.   CBG TID WC + nighttime   Increase to 14 units lantus + mealtime 3u novolog   #Urinary retention and incontinence  Continues to retain after spontaneous voids  q8 bladder scan   Consider outpatient urology follow up if continues   #Acute Anemia  11.7>9.2  Continue to monitor on CBC  # HTN Normotensive   Continue home metoprolol   # HLD  Continue home statin    #GERD  Continue home PTX  # AKI on CKD  AKI resolved  Cr 0.98 w/ improving GFR  Monitor Creatinine  Avoid nephrotoxic drugs  #Hypothyroidism   Continue home levothyroxine   #Chornic Diarrhea  stable  C diff negative in PCP 06/07/18   #History liver cirrhosis  ALT/AST wnl on arrival. Ammonia normal. Abdominal US normal.   #Glaucoma  Continue eye drops   # FEN . Fluids: none . Electrolytes: wnl  . Dys 3 diet  # DVT Prophylaxis . Lovenox 40  # Disposition . Trending Labs: BMP for Cr and CBC Hgb . PT recs for CIR - son agrees - CIR consult placed today  Subjective  NAEO  Objective   Vital Signs Intake/Output  Temp:  [97.8 F (36.6 C)-98.3 F (36.8 C)] 97.8 F (36.6 C) (02/12 0422) Pulse Rate:  [88-103] 103 (02/12 0422) Resp:  [16-20] 20 (02/12 0422) BP: (135-165)/(66-110) 165/66 (02/12 0422) SpO2:  [98 %-100 %] 100 % (02/12 0422)  Filed Weights   06/18/18 1400 06/18/18 1418  Weight: 66.2 kg 66.2 kg   Intake/Output      02/11 0701 - 02/12 0700 02/12 0701 - 02/13 0700   P.O. 520    I.V. (mL/kg) 1738.2 (26.3)    IV Piggyback 350.5    Total Intake(mL/kg) 2608.7 (39.4)    Urine (mL/kg/hr) 3008 (1.9)    Total Output 3008    Net -399.4         Urine Occurrence 303 x  Physical Exam  Gen: Lying in bed comfortably, alert  Skin: Warm and dry HEENT: NCAT.  MMM.  CV: RRR.  Normal S1-S2. Mild pitting BLEE Resp: CTAB. No increased WOB Abd: Distended, nontender. Tympanic to percussion. + BS.  Extremities: LUE without pitting and edema appears  resolved  Neuro: Pt with less slurred speech and quicker response to son's questions on the phone. Smiles  Laboratory: CBC: Recent Labs  Lab 06/18/18 1306 06/18/18 1715 06/19/18 0747 06/20/18 0633 06/21/18 0325  WBC 14.5*  --  14.3* 9.6 6.8  NEUTROABS 9.4*  --  9.9*  --   --   HGB 11.7* 11.6* 10.0* 10.0* 9.2*  HCT 37.2 34.0* 31.4* 30.3* 28.9*  MCV 89.2  --  88.5 89.1 90.6  PLT 228  --  170  182 153   Basic Metabolic Panel: Recent Labs  Lab 06/18/18 1306 06/18/18 1715 06/18/18 2128 06/19/18 0223 06/20/18 0633 06/21/18 0325  NA 127* 130* 124* 131* 134* 134*  K 4.5 4.1 3.8 3.3* 3.8 3.6  CL 94*  --  97* 103 108 106  CO2 19*  --  16* 16* 17* 17*  GLUCOSE 453*  --  463* 289* 192* 326*  BUN 26*  --  27* 29* 21 14  CREATININE 1.54*  --  1.33* 1.49* 1.03* 0.98  CALCIUM 8.9  --  8.0* 8.3* 8.0* 8.1*    Imaging/Diagnostic Tests: No new imaging in last 24 hours.    Melene PlanKim, Sachi Boulay E, MD 06/21/2018, 7:32 AM PGY-1, [facility] FPTS Intern pager: [phone], text pages welcome

## 2018-06-22 ENCOUNTER — Inpatient Hospital Stay (HOSPITAL_COMMUNITY)
Admission: RE | Admit: 2018-06-22 | Discharge: 2018-06-27 | DRG: 945 | Disposition: A | Discharge status: Home Health | Payer: Medicare Other | Source: Intra-hospital | Attending: Physical Medicine & Rehabilitation | Admitting: Physical Medicine & Rehabilitation

## 2018-06-22 ENCOUNTER — Encounter (HOSPITAL_COMMUNITY): Payer: Self-pay | Admitting: *Deleted

## 2018-06-22 ENCOUNTER — Other Ambulatory Visit: Payer: Self-pay

## 2018-06-22 DIAGNOSIS — M810 Age-related osteoporosis without current pathological fracture: Secondary | ICD-10-CM | POA: Diagnosis present

## 2018-06-22 DIAGNOSIS — D62 Acute posthemorrhagic anemia: Secondary | ICD-10-CM

## 2018-06-22 DIAGNOSIS — E039 Hypothyroidism, unspecified: Secondary | ICD-10-CM | POA: Diagnosis present

## 2018-06-22 DIAGNOSIS — N10 Acute tubulo-interstitial nephritis: Secondary | ICD-10-CM

## 2018-06-22 DIAGNOSIS — N390 Urinary tract infection, site not specified: Secondary | ICD-10-CM | POA: Diagnosis present

## 2018-06-22 DIAGNOSIS — E119 Type 2 diabetes mellitus without complications: Secondary | ICD-10-CM | POA: Diagnosis present

## 2018-06-22 DIAGNOSIS — B952 Enterococcus as the cause of diseases classified elsewhere: Secondary | ICD-10-CM | POA: Diagnosis present

## 2018-06-22 DIAGNOSIS — I10 Essential (primary) hypertension: Secondary | ICD-10-CM | POA: Diagnosis present

## 2018-06-22 DIAGNOSIS — Z79890 Hormone replacement therapy (postmenopausal): Secondary | ICD-10-CM

## 2018-06-22 DIAGNOSIS — D638 Anemia in other chronic diseases classified elsewhere: Secondary | ICD-10-CM | POA: Diagnosis present

## 2018-06-22 DIAGNOSIS — Z794 Long term (current) use of insulin: Secondary | ICD-10-CM | POA: Diagnosis not present

## 2018-06-22 DIAGNOSIS — E46 Unspecified protein-calorie malnutrition: Secondary | ICD-10-CM

## 2018-06-22 DIAGNOSIS — R5381 Other malaise: Principal | ICD-10-CM

## 2018-06-22 DIAGNOSIS — E785 Hyperlipidemia, unspecified: Secondary | ICD-10-CM | POA: Diagnosis present

## 2018-06-22 DIAGNOSIS — K5900 Constipation, unspecified: Secondary | ICD-10-CM | POA: Diagnosis present

## 2018-06-22 DIAGNOSIS — G9341 Metabolic encephalopathy: Secondary | ICD-10-CM | POA: Diagnosis not present

## 2018-06-22 DIAGNOSIS — R410 Disorientation, unspecified: Secondary | ICD-10-CM

## 2018-06-22 DIAGNOSIS — E8809 Other disorders of plasma-protein metabolism, not elsewhere classified: Secondary | ICD-10-CM | POA: Diagnosis present

## 2018-06-22 DIAGNOSIS — R6520 Severe sepsis without septic shock: Secondary | ICD-10-CM

## 2018-06-22 DIAGNOSIS — G9340 Encephalopathy, unspecified: Secondary | ICD-10-CM

## 2018-06-22 DIAGNOSIS — K567 Ileus, unspecified: Secondary | ICD-10-CM

## 2018-06-22 DIAGNOSIS — A419 Sepsis, unspecified organism: Secondary | ICD-10-CM | POA: Diagnosis not present

## 2018-06-22 LAB — GLUCOSE, CAPILLARY
Glucose-Capillary: 190 mg/dL — ABNORMAL HIGH (ref 70–99)
Glucose-Capillary: 197 mg/dL — ABNORMAL HIGH (ref 70–99)
Glucose-Capillary: 231 mg/dL — ABNORMAL HIGH (ref 70–99)
Glucose-Capillary: 245 mg/dL — ABNORMAL HIGH (ref 70–99)
Glucose-Capillary: 294 mg/dL — ABNORMAL HIGH (ref 70–99)

## 2018-06-22 LAB — CBC
HCT: 32.1 % — ABNORMAL LOW (ref 36.0–46.0)
HCT: 32.6 % — ABNORMAL LOW (ref 36.0–46.0)
Hemoglobin: 10.4 g/dL — ABNORMAL LOW (ref 12.0–15.0)
Hemoglobin: 10.3 g/dL — ABNORMAL LOW (ref 12.0–15.0)
MCH: 29 pg (ref 26.0–34.0)
MCH: 28.2 pg (ref 26.0–34.0)
MCHC: 32.4 g/dL (ref 30.0–36.0)
MCHC: 31.6 g/dL (ref 30.0–36.0)
MCV: 89.4 fL (ref 80.0–100.0)
MCV: 89.3 fL (ref 80.0–100.0)
Platelets: 179 10*3/uL (ref 150–400)
Platelets: 177 10*3/uL (ref 150–400)
RBC: 3.59 MIL/uL — ABNORMAL LOW (ref 3.87–5.11)
RBC: 3.65 MIL/uL — ABNORMAL LOW (ref 3.87–5.11)
RDW: 15.5 % (ref 11.5–15.5)
RDW: 15.5 % (ref 11.5–15.5)
WBC: 8.1 10*3/uL (ref 4.0–10.5)
WBC: 9.4 10*3/uL (ref 4.0–10.5)
nRBC: 0 % (ref 0.0–0.2)
nRBC: 0 % (ref 0.0–0.2)

## 2018-06-22 LAB — CREATININE, SERUM
Creatinine, Ser: 0.98 mg/dL (ref 0.44–1.00)
GFR calc Af Amer: >60 mL/min (ref >60)
GFR calc non Af Amer: 55 mL/min — ABNORMAL LOW (ref >60)

## 2018-06-22 LAB — BASIC METABOLIC PANEL
Anion gap: 8 (ref 5–15)
BUN: 8 mg/dL (ref 8–23)
CO2: 22 mmol/L (ref 22–32)
Calcium: 8.3 mg/dL — ABNORMAL LOW (ref 8.9–10.3)
Chloride: 104 mmol/L (ref 98–111)
Creatinine, Ser: 0.98 mg/dL (ref 0.44–1.00)
GFR calc non Af Amer: 55 mL/min — ABNORMAL LOW (ref >60)
Glucose, Bld: 242 mg/dL — ABNORMAL HIGH (ref 70–99)
Potassium: 3.2 mmol/L — ABNORMAL LOW (ref 3.5–5.1)
Sodium: 134 mmol/L — ABNORMAL LOW (ref 135–145)

## 2018-06-22 LAB — URINE CULTURE: Culture: >=100000 — AB

## 2018-06-22 LAB — VITAMIN B1: Vitamin B1 (Thiamine): 145.7 nmol/L (ref 66.5–200.0)

## 2018-06-22 MED ORDER — LOSARTAN POTASSIUM 100 MG PO TABS: 50.0000 mg | ORAL_TABLET | Freq: Every day | ORAL | Status: DC

## 2018-06-22 MED ORDER — ONDANSETRON HCL 4 MG/2ML IJ SOLN: 4.0000 mg | Freq: Four times a day (QID) | INTRAMUSCULAR | Status: DC | PRN

## 2018-06-22 MED ORDER — POTASSIUM CHLORIDE CRYS ER 20 MEQ PO TBCR
60.0000 meq | EXTENDED_RELEASE_TABLET | Freq: Once | ORAL | Status: AC
  Administered 2018-06-22: 60 meq via ORAL
  Filled 2018-06-22: qty 3

## 2018-06-22 MED ORDER — METOPROLOL TARTRATE 25 MG PO TABS
25.0000 mg | ORAL_TABLET | Freq: Two times a day (BID) | ORAL | Status: DC
  Administered 2018-06-22 – 2018-06-27 (×10): 25 mg via ORAL
  Filled 2018-06-22 (×10): qty 1

## 2018-06-22 MED ORDER — ENOXAPARIN SODIUM 40 MG/0.4ML ~~LOC~~ SOLN
40.0000 mg | SUBCUTANEOUS | Status: DC
  Administered 2018-06-22 – 2018-06-26 (×5): 40 mg via SUBCUTANEOUS
  Filled 2018-06-22 (×5): qty 0.4

## 2018-06-22 MED ORDER — AMOXICILLIN 500 MG PO CAPS: 500.0000 mg | ORAL_CAPSULE | Freq: Three times a day (TID) | ORAL | 0 refills | Status: DC

## 2018-06-22 MED ORDER — LOSARTAN POTASSIUM 50 MG PO TABS
50.0000 mg | ORAL_TABLET | Freq: Every day | ORAL | Status: DC
  Administered 2018-06-22: 50 mg via ORAL
  Filled 2018-06-22: qty 1

## 2018-06-22 MED ORDER — TIMOLOL MALEATE 0.5 % OP SOLN
1.0000 [drp] | Freq: Every day | OPHTHALMIC | Status: DC
  Administered 2018-06-23 – 2018-06-27 (×5): 1 [drp] via OPHTHALMIC
  Filled 2018-06-22: qty 5

## 2018-06-22 MED ORDER — LATANOPROST 0.005 % OP SOLN
1.0000 [drp] | Freq: Every day | OPHTHALMIC | Status: DC
  Administered 2018-06-22 – 2018-06-27 (×5): 1 [drp] via OPHTHALMIC
  Filled 2018-06-22 (×2): qty 2.5

## 2018-06-22 MED ORDER — INSULIN ASPART 100 UNIT/ML ~~LOC~~ SOLN: 0.0000 [IU] | Freq: Three times a day (TID) | SUBCUTANEOUS | 11 refills | Status: DC

## 2018-06-22 MED ORDER — SORBITOL 70 % SOLN
30.0000 mL | Freq: Every day | Status: DC | PRN
  Administered 2018-06-26: 30 mL via ORAL
  Filled 2018-06-22: qty 30

## 2018-06-22 MED ORDER — AMLODIPINE BESYLATE 5 MG PO TABS
5.0000 mg | ORAL_TABLET | Freq: Every day | ORAL | Status: DC
  Administered 2018-06-23 – 2018-06-27 (×5): 5 mg via ORAL
  Filled 2018-06-22 (×5): qty 1

## 2018-06-22 MED ORDER — GLUCERNA SHAKE PO LIQD
237.0000 mL | Freq: Three times a day (TID) | ORAL | Status: DC
  Administered 2018-06-22 – 2018-06-27 (×7): 237 mL via ORAL

## 2018-06-22 MED ORDER — LEVETIRACETAM 500 MG PO TABS
500.0000 mg | ORAL_TABLET | Freq: Two times a day (BID) | ORAL | Status: DC
  Administered 2018-06-22 – 2018-06-27 (×10): 500 mg via ORAL
  Filled 2018-06-22 (×10): qty 1

## 2018-06-22 MED ORDER — INSULIN ASPART 100 UNIT/ML ~~LOC~~ SOLN: 3.0000 [IU] | Freq: Three times a day (TID) | SUBCUTANEOUS | 11 refills | Status: DC

## 2018-06-22 MED ORDER — LEVOTHYROXINE SODIUM 25 MCG PO TABS
25.0000 ug | ORAL_TABLET | Freq: Every day | ORAL | Status: DC
  Administered 2018-06-23 – 2018-06-27 (×5): 25 ug via ORAL
  Filled 2018-06-22 (×5): qty 1

## 2018-06-22 MED ORDER — INSULIN ASPART 100 UNIT/ML ~~LOC~~ SOLN: 0.0000 [IU] | Freq: Every day | SUBCUTANEOUS | 11 refills | Status: DC

## 2018-06-22 MED ORDER — ADULT MULTIVITAMIN W/MINERALS CH
1.0000 | ORAL_TABLET | Freq: Every day | ORAL | Status: DC
  Administered 2018-06-23 – 2018-06-27 (×5): 1 via ORAL
  Filled 2018-06-22 (×5): qty 1

## 2018-06-22 MED ORDER — INSULIN GLARGINE 100 UNIT/ML ~~LOC~~ SOLN
14.0000 [IU] | Freq: Every day | SUBCUTANEOUS | Status: DC
  Administered 2018-06-23 – 2018-06-24 (×2): 14 [IU] via SUBCUTANEOUS
  Filled 2018-06-22 (×3): qty 0.14

## 2018-06-22 MED ORDER — PANTOPRAZOLE SODIUM 40 MG PO TBEC
40.0000 mg | DELAYED_RELEASE_TABLET | Freq: Every day | ORAL | Status: DC
  Administered 2018-06-23 – 2018-06-27 (×5): 40 mg via ORAL
  Filled 2018-06-22 (×5): qty 1

## 2018-06-22 MED ORDER — LEVETIRACETAM 500 MG PO TABS: 500.0000 mg | ORAL_TABLET | Freq: Two times a day (BID) | ORAL | Status: DC

## 2018-06-22 MED ORDER — ACETAMINOPHEN 325 MG PO TABS
650.0000 mg | ORAL_TABLET | Freq: Four times a day (QID) | ORAL | Status: DC | PRN
  Administered 2018-06-23 – 2018-06-25 (×6): 650 mg via ORAL
  Filled 2018-06-22 (×7): qty 2

## 2018-06-22 MED ORDER — ACETAMINOPHEN 650 MG RE SUPP: 650.0000 mg | Freq: Four times a day (QID) | RECTAL | Status: DC | PRN

## 2018-06-22 MED ORDER — AMOXICILLIN 500 MG PO CAPS
500.0000 mg | ORAL_CAPSULE | Freq: Three times a day (TID) | ORAL | Status: DC
  Administered 2018-06-22: 500 mg via ORAL
  Filled 2018-06-22 (×2): qty 1

## 2018-06-22 MED ORDER — ADULT MULTIVITAMIN W/MINERALS CH: 1.0000 | ORAL_TABLET | Freq: Every day | ORAL | Status: DC

## 2018-06-22 MED ORDER — ROSUVASTATIN CALCIUM 5 MG PO TABS
10.0000 mg | ORAL_TABLET | Freq: Every day | ORAL | Status: DC
  Administered 2018-06-22 – 2018-06-26 (×5): 10 mg via ORAL
  Filled 2018-06-22 (×5): qty 2

## 2018-06-22 MED ORDER — INSULIN ASPART 100 UNIT/ML ~~LOC~~ SOLN
0.0000 [IU] | Freq: Three times a day (TID) | SUBCUTANEOUS | Status: DC
  Administered 2018-06-23: 5 [IU] via SUBCUTANEOUS
  Administered 2018-06-23: 3 [IU] via SUBCUTANEOUS
  Administered 2018-06-23: 5 [IU] via SUBCUTANEOUS
  Administered 2018-06-24 (×2): 3 [IU] via SUBCUTANEOUS
  Administered 2018-06-24 – 2018-06-25 (×2): 7 [IU] via SUBCUTANEOUS
  Administered 2018-06-25: 2 [IU] via SUBCUTANEOUS
  Administered 2018-06-25 – 2018-06-26 (×2): 7 [IU] via SUBCUTANEOUS
  Administered 2018-06-26: 2 [IU] via SUBCUTANEOUS
  Administered 2018-06-26: 3 [IU] via SUBCUTANEOUS
  Administered 2018-06-27: 2 [IU] via SUBCUTANEOUS

## 2018-06-22 MED ORDER — ONDANSETRON HCL 4 MG PO TABS
4.0000 mg | ORAL_TABLET | Freq: Four times a day (QID) | ORAL | Status: DC | PRN
  Administered 2018-06-23 – 2018-06-26 (×5): 4 mg via ORAL
  Filled 2018-06-22 (×5): qty 1

## 2018-06-22 MED ORDER — DORZOLAMIDE HCL 2 % OP SOLN
1.0000 [drp] | Freq: Two times a day (BID) | OPHTHALMIC | Status: DC
  Administered 2018-06-22 – 2018-06-27 (×10): 1 [drp] via OPHTHALMIC
  Filled 2018-06-22 (×2): qty 10

## 2018-06-22 MED ORDER — ENOXAPARIN SODIUM 40 MG/0.4ML ~~LOC~~ SOLN: 40.0000 mg | Freq: Every day | SUBCUTANEOUS | Status: DC

## 2018-06-22 MED ORDER — INSULIN ASPART 100 UNIT/ML ~~LOC~~ SOLN
3.0000 [IU] | Freq: Three times a day (TID) | SUBCUTANEOUS | Status: DC
  Administered 2018-06-22: 3 [IU] via SUBCUTANEOUS

## 2018-06-22 MED ORDER — ALBUTEROL SULFATE (2.5 MG/3ML) 0.083% IN NEBU: 2.5000 mg | INHALATION_SOLUTION | Freq: Four times a day (QID) | RESPIRATORY_TRACT | Status: DC | PRN

## 2018-06-22 MED ORDER — LOSARTAN POTASSIUM 50 MG PO TABS
50.0000 mg | ORAL_TABLET | Freq: Every day | ORAL | Status: DC
  Administered 2018-06-23 – 2018-06-27 (×5): 50 mg via ORAL
  Filled 2018-06-22 (×5): qty 1

## 2018-06-22 NOTE — H&P (Signed)
Physical Medicine and Rehabilitation Admission H&P    : HPI: Lori Hardy is a 78 year old right handed limited English-speaking female with history of type 2 diabetes mellitus, hyperlipidemia, anemia chronic disease,CKD stage II. Per chart review patient lives with son and family. Used a walker prior to admission. She has a home health aide 2 hours a day. Daughter-in-law is available. Presented  06/18/2018 with complaints of headache, altered mental status, vague left side weakness and vomiting. Noted fever 104, lactic acid 3.5,urine study greater 100,000 enterococcus faecalis. Cranial CT scan negative for acute changes. Noted small remote lacunar infarction posteriorly in the right lentiform nucleus and left caudate head. MRI negative for acute changes. CT angiogram of head and neck with no dissection or aneurysm. Lumbar puncture completed glucose elevated 200, protein 55, Debbie BC of 8. Ultrasound the abdomen negative. Blood cultures no growth to date. Currently maintained on Rocephin for UTI 7 days. EEG negative for seizure. Maintained on Keppra for seizure prophylaxis. Neurology follow-up suspect septic metabolic encephalopathy. Hospital course complicated by hypertensive crisis and monitored. Subcutaneous Lovenox for DVT prophylaxis. Therapy evaluations completed with recommendations of physical medicine rehabilitation consult. Patient was admitted for a comprehensive rehabilitation program.  Review of Systems  Constitutional: Positive for fever.  HENT: Negative for hearing loss.   Eyes: Negative for blurred vision and double vision.  Respiratory: Negative for cough and shortness of breath.   Cardiovascular: Positive for leg swelling. Negative for chest pain and palpitations.  Gastrointestinal: Positive for constipation. Negative for nausea.  Genitourinary: Positive for urgency. Negative for dysuria, flank pain and hematuria.  Musculoskeletal: Positive for myalgias.  Skin: Negative for  rash.  Neurological: Positive for weakness and headaches.  All other systems reviewed and are negative.  Past Medical History:  Diagnosis Date  . Aortic atherosclerosis (HCC) 12/15/2015   Overview:  By imaging, xray 11/2015.  . Closed fracture of right proximal humerus 04/06/2017  . Compression fracture of L4 lumbar vertebra, closed, initial encounter (HCC) 01/12/2017  . Diabetes mellitus without complication (HCC)   . Exposure to TB 09/26/2015  . 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  Differential includes diet, medications, possible pancreatic insufficiency.  Negative C. difficile 09/27/2015  . H. pylori infection 10/07/2016   Overview:  BX confirmed. 09/15/2016- Dr. Noe Gens, [facility].   Marland Kitchen History of compression fracture of spine 11/20/2015   Overview:  T12, on prolia.  Marland Kitchen History of Helicobacter pylori infection 11/20/2015   Overview:  By EGD 2017.  Treated with 2 courses of antibiotics at [facility].  . Hx of pancreatitis 09/26/2015   Overview:  08/2015 [facility]  . Hypertension   . Hyponatremia 04/18/2017  . 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 No active disease on chest xray 11/25/15. Pt from [place], past exposure to TB. Pt agreed to referral and treatment by [facility]- referral sent 12/15/2015 (telephone [phone], fax #[phone]).   . 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 Pseudophakia 06/11/2015  . Seasonal allergic rhinitis 09/02/2016  . Senile osteoporosis 06/26/2015   Overview:  Bone density 12/2014 [facility]. T-4.4 and -3.8. On prolia managed by Dr. Allena Katz.  Normal intact PTH in 2017.  Marland Kitchen Thrombocytopenia (HCC) 09/26/2015  . Xerosis of skin 04/14/2016  Past Surgical  History:  Procedure Laterality Date  . cataracts     History reviewed. No pertinent family history. Social History:  reports that she has never smoked. She has never used smokeless tobacco. No history on file for alcohol and drug. Allergies:  Allergies  Allergen Reactions  . Brimonidine Itching    Redness and itching eye Redness and itching Redness and itching    Medications Prior to Admission  Medication Sig Dispense Refill  . amLODipine (NORVASC) 5 MG tablet Take 5 mg by mouth daily.     . Cholecalciferol (VITAMIN D3) 25 MCG (1000 UT) CAPS Take 1,000 mg by mouth daily.    . diphenoxylate-atropine (LOMOTIL) 2.5-0.025 MG tablet Take 1 tablet by mouth 4 (four) times daily as needed.    . dorzolamide (TRUSOPT) 2 % ophthalmic solution Place 1 drop into the right eye 2 (two) times daily.    Marland Kitchen. esomeprazole (NEXIUM) 40 MG capsule Take 40 mg daily at 12 noon by mouth.    Marland Kitchen. glipiZIDE (GLUCOTROL) 10 MG tablet Take 10 mg daily before breakfast by mouth.    . Insulin Glargine (BASAGLAR KWIKPEN) 100 UNIT/ML SOPN Inject 14 Units into the skin at bedtime.    Marland Kitchen. latanoprost (XALATAN) 0.005 % ophthalmic solution Place 1 drop into both eyes at bedtime.    Marland Kitchen. levothyroxine (SYNTHROID, LEVOTHROID) 25 MCG tablet Take 25 mcg daily before breakfast by mouth.    . loratadine (CLARITIN) 10 MG tablet Take 10 mg by mouth daily.    Marland Kitchen. losartan (COZAAR) 100 MG tablet Take 100 mg daily by mouth.    . Melatonin 1 MG TABS Take 1 mg by mouth at bedtime.    . metoprolol tartrate (LOPRESSOR) 25 MG tablet Take 25 mg by mouth 2 (two) times daily.    . ondansetron (ZOFRAN) 8 MG tablet Take every 8 (eight) hours as needed by mouth for nausea or vomiting.    . pioglitazone (ACTOS) 45 MG tablet Take 45 mg daily by mouth.    . rosuvastatin (CRESTOR) 10 MG tablet Take 10 mg by mouth at bedtime.     . timolol (TIMOPTIC) 0.5 % ophthalmic solution Place 1 drop into the right eye daily.    . traMADol (ULTRAM) 50 MG tablet Take 50  mg by mouth every 6 (six) hours as needed for pain.      Drug Regimen Review Drug regimen was reviewed and remains appropriate with no significant issues identified  Home: Home Living Family/patient expects to be discharged to:: Inpatient rehab Additional Comments: Pt's daughter in law expresses concern as family is not available 24/7 at home.   Functional History: Prior Function Level of Independence: Independent with assistive device(s) Comments: uses rollator or SPC usually  Functional Status:  Mobility: Bed Mobility Overal bed mobility: Needs Assistance Bed Mobility: Supine to Sit, Sit to Supine Supine to sit: Mod assist Sit to supine: Mod assist General bed mobility comments: mod to lift trunk and finish scooting to EOB, mod to lift legs and trunk back.  2 person assist to scoot up in bed Transfers Overall transfer level: Needs assistance Equipment used: Rolling walker (2 wheeled) Transfers: Sit to/from Stand Sit to Stand: Mod assist Stand pivot transfers: Max assist, From elevated surface(max due to gait fatigue) General transfer comment: cued sitting balance and control of push to stand Ambulation/Gait Ambulation/Gait assistance: +2 physical assistance, +2 safety/equipment, Mod assist Gait Distance (Feet): 12 Feet Assistive device: Rolling walker (2 wheeled), 2 person hand held assist Gait Pattern/deviations:  Step-to pattern, Step-through pattern, Decreased stride length, Wide base of support, Trunk flexed, Decreased weight shift to left General Gait Details: pt is weak, requires dense cues for safety with both sitting and standing  Gait velocity: reduced Gait velocity interpretation: <1.8 ft/sec, indicate of risk for recurrent falls    ADL: ADL Overall ADL's : Needs assistance/impaired Eating/Feeding: Set up, Bed level Grooming: Minimal assistance, Bed level Upper Body Bathing: Moderate assistance Lower Body Bathing: Maximal assistance Upper Body Dressing :  Moderate assistance Lower Body Dressing: Maximal assistance Toilet Transfer: (DNT) Toilet Transfer Details (indicate cue type and reason): unable to perform today due to nausea and plopping back onto bed with lateral leans and posterior lean. Functional mobility during ADLs: Maximal assistance, Rolling walker(maxA for power up and for stabilizing) General ADL Comments: Increased time and effort required; unable to perform without mod to maxA  Cognition: Cognition Overall Cognitive Status: Impaired/Different from baseline Orientation Level: Oriented to person, Disoriented to place, Disoriented to time, Disoriented to situation Cognition Arousal/Alertness: Lethargic Behavior During Therapy: Flat affect Overall Cognitive Status: Impaired/Different from baseline Area of Impairment: Following commands, Safety/judgement, Awareness, Problem solving Following Commands: Follows one step commands with increased time Safety/Judgement: Decreased awareness of safety, Decreased awareness of deficits General Comments: Daughter in law cannot translate well  Physical Exam: Blood pressure 114/85, pulse 95, temperature 98.3 F (36.8 C), temperature source Axillary, resp. rate (!) 22, height 5\' 4"  (1.626 m), weight 66.2 kg, SpO2 100 %. Physical Exam  HENT:  Head: Normocephalic.  Neck: Normal range of motion. Neck supple. No thyromegaly present.  Cardiovascular:  Cardiac rate controlled  Respiratory: Effort normal and breath sounds normal. No respiratory distress.  Neurological:  Patient is alert. Limited English speaking. She does follow simple demonstrated commands.  Skin: Skin is warm and dry.    General: No acute distress Mood and affect are appropriate Heart: Regular rate and rhythm no rubs murmurs or extra sounds Lungs: Clear to auscultation, breathing unlabored, no rales or wheezes Abdomen: Positive bowel sounds, soft nontender to palpation, nondistended Extremities: No clubbing, cyanosis,  or edema Skin: No evidence of breakdown, no evidence of rash Neurologic:  motor strength is 5/5 in bilateral deltoid, bicep, tricep, grip, hip flexor, knee extensors, ankle dorsiflexor and plantar flexor Sensory exam normal sensation to light touch and proprioception in bilateral upper and lower extremities Cerebellar exam normal finger to nose to finger as well as heel to shin in bilateral upper and lower extremities Musculoskeletal: Full range of motion in all 4 extremities. No joint swelling Results for orders placed or performed during the hospital encounter of 06/18/18 (from the past 48 hour(s))  Glucose, capillary     Status: Abnormal   Collection Time: 06/20/18 11:13 AM  Result Value Ref Range   Glucose-Capillary 181 (H) 70 - 99 mg/dL  Glucose, capillary     Status: Abnormal   Collection Time: 06/20/18 12:13 PM  Result Value Ref Range   Glucose-Capillary 209 (H) 70 - 99 mg/dL  Glucose, capillary     Status: Abnormal   Collection Time: 06/20/18  4:56 PM  Result Value Ref Range   Glucose-Capillary 193 (H) 70 - 99 mg/dL  Glucose, capillary     Status: Abnormal   Collection Time: 06/20/18  9:05 PM  Result Value Ref Range   Glucose-Capillary 190 (H) 70 - 99 mg/dL  Glucose, capillary     Status: Abnormal   Collection Time: 06/20/18  9:24 PM  Result Value Ref Range   Glucose-Capillary 187 (  H) 70 - 99 mg/dL  Basic metabolic panel     Status: Abnormal   Collection Time: 06/21/18  3:25 AM  Result Value Ref Range   Sodium 134 (L) 135 - 145 mmol/L   Potassium 3.6 3.5 - 5.1 mmol/L   Chloride 106 98 - 111 mmol/L   CO2 17 (L) 22 - 32 mmol/L   Glucose, Bld 326 (H) 70 - 99 mg/dL   BUN 14 8 - 23 mg/dL   Creatinine, Ser 1.61 0.44 - 1.00 mg/dL   Calcium 8.1 (L) 8.9 - 10.3 mg/dL   GFR calc non Af Amer 55 (L) >60 mL/min   GFR calc Af Amer >60 >60 mL/min   Anion gap 11 5 - 15    Comment: Performed at [facility], [address] [place], [place] [address]  CBC     Status: Abnormal    Collection Time: 06/21/18  3:25 AM  Result Value Ref Range   WBC 6.8 4.0 - 10.5 K/uL   RBC 3.19 (L) 3.87 - 5.11 MIL/uL   Hemoglobin 9.2 (L) 12.0 - 15.0 g/dL   HCT 54.0 (L) 98.1 - 19.1 %   MCV 90.6 80.0 - 100.0 fL   MCH 28.8 26.0 - 34.0 pg   MCHC 31.8 30.0 - 36.0 g/dL   RDW 47.8 (H) 29.5 - 62.1 %   Platelets 153 150 - 400 K/uL   nRBC 0.0 0.0 - 0.2 %    Comment: Performed at [facility], [address], [place], [place] [address]  Glucose, capillary     Status: Abnormal   Collection Time: 06/21/18  6:24 AM  Result Value Ref Range   Glucose-Capillary 347 (H) 70 - 99 mg/dL  Glucose, capillary     Status: Abnormal   Collection Time: 06/21/18 11:20 AM  Result Value Ref Range   Glucose-Capillary 272 (H) 70 - 99 mg/dL   Comment 1 Notify RN   Glucose, capillary     Status: Abnormal   Collection Time: 06/21/18  4:21 PM  Result Value Ref Range   Glucose-Capillary 215 (H) 70 - 99 mg/dL   Comment 1 Notify RN    Comment 2 Call MD NNP PA CNM   Glucose, capillary     Status: Abnormal   Collection Time: 06/21/18  8:06 PM  Result Value Ref Range   Glucose-Capillary 247 (H) 70 - 99 mg/dL  Glucose, capillary     Status: Abnormal   Collection Time: 06/22/18  6:30 AM  Result Value Ref Range   Glucose-Capillary 190 (H) 70 - 99 mg/dL  Glucose, capillary     Status: Abnormal   Collection Time: 06/22/18  7:52 AM  Result Value Ref Range   Glucose-Capillary 197 (H) 70 - 99 mg/dL   Comment 1 Notify RN    Comment 2 Document in Chart   Basic metabolic panel     Status: Abnormal   Collection Time: 06/22/18  9:44 AM  Result Value Ref Range   Sodium 134 (L) 135 - 145 mmol/L   Potassium 3.2 (L) 3.5 - 5.1 mmol/L   Chloride 104 98 - 111 mmol/L   CO2 22 22 - 32 mmol/L   Glucose, Bld 242 (H) 70 - 99 mg/dL   BUN 8 8 - 23 mg/dL   Creatinine, Ser 7.84 0.44 - 1.00 mg/dL   Calcium 8.3 (L) 8.9 - 10.3 mg/dL   GFR calc non Af Amer 55 (L) >60 mL/min   GFR calc Af Amer >  60 >60 mL/min   Anion gap 8 5 -  15    Comment: Performed at [facility], [address] [place], [place] [address]  CBC     Status: Abnormal   Collection Time: 06/22/18  9:44 AM  Result Value Ref Range   WBC 8.1 4.0 - 10.5 K/uL   RBC 3.59 (L) 3.87 - 5.11 MIL/uL   Hemoglobin 10.4 (L) 12.0 - 15.0 g/dL   HCT 60.4 (L) 54.0 - 98.1 %   MCV 89.4 80.0 - 100.0 fL   MCH 29.0 26.0 - 34.0 pg   MCHC 32.4 30.0 - 36.0 g/dL   RDW 19.1 47.8 - 29.5 %   Platelets 179 150 - 400 K/uL   nRBC 0.0 0.0 - 0.2 %    Comment: Performed at [facility], [address], [place], [place] [address]   No results found.     Medical Problem List and Plan: 1.  Decreased functional mobility with debilitation secondary to septic metabolic encephalopathy/urosepsis. Blood cultures and CFS cultures negative to date Initiate rehabilitation evaluations PT OT in a.m. 2.  DVT Prophylaxis/Anticoagulation: subcutaneous Lovenox. Monitor for any bleeding episodes monitor platelets 3. Pain Management:  Tylenol as needed, avoid narcotic analgesics given age and fall risk 4. Mood:  Provide emotional support 5. Neuropsych: This patient is capable of making decisions on her own behalf. 6. Skin/Wound Care:  Routine skin checks 7. Fluids/Electrolytes/Nutrition:  Routine in and out's with follow-up chemistries 8. Hypertension. Norvasc 5 mg daily,Cozaar 50 mg daily, Lopressor 25 mg twice a day. Monitor with increased mobility Monitor for potential hypotension and orthostatic changes 9. Seizure prophylaxis. Keppra 500 mg twice a day. EEG negative 10. Diabetes mellitus. Hemoglobin A1c 9.7.  Lantus insulin 14 units daily. Check blood sugars before meals and at bedtime 11. Urosepsis/enterococcus faecalis. Plan to continue Rocephin through 06/24/2018 and stop 12. Hypothyroidism. Synthroid 13.  Hypokalemia supplement potassium Post Admission Physician Evaluation: 1. Functional deficits secondary  to  debilitation secondary to septic metabolic  encephalopathy/urosepsis. 2. Patient admitted to receive collaborative, interdisciplinary care between the physiatrist, rehab nursing staff, and therapy team. 3. Patient's level of medical complexity and substantial therapy needs in context of that medical necessity cannot be provided at a lesser intensity of care. 4. Patient has experienced substantial functional loss from his/her baseline. Judging by the patient's diagnosis, physical exam, and functional history, the patient has potential for functional progress which will result in measurable gains while on inpatient rehab.  These gains will be of substantial and practical use upon discharge in facilitating mobility and self-care at the household level. 5. Physiatrist will provide 24 hour management of medical needs as well as oversight of the therapy plan/treatment and provide guidance as appropriate regarding the interaction of the two. 6. 24 hour rehab nursing will assist in the management of  bladder management, bowel management, safety, skin/wound care, disease management, medication administration, pain management and patient education  and help integrate therapy concepts, techniques,education, etc. 7. PT will assess and treat for: DME instruction, Gait training, Functional mobility training, Therapeutic activities, Therapeutic exercise, Balance training, Neuromuscular re-education, Cognitive remediation, Patient/family education.  Goals are: supervision. 8. OT will assess and treat for Self-care/ADL training, Neuromuscular education, Energy conservation, Balance training, DME and/or AE instruction.  Goals are: minimal assist.  9. SLP will assess and treat for  .  Goals are: N/A. 10. Case Management and Social Worker will assess and treat for psychological issues and discharge planning. 11. Team  conference will be held weekly to assess progress toward goals and to determine barriers to discharge. 12.  Patient will receive at least 3 hours of  therapy per day at least 5 days per week. 13. ELOS and Prognosis: 14-18d good   "I have personally performed a face to face diagnostic evaluation of this patient.  Additionally, I have reviewed and concur with the physician assistant's documentation above."  Erick Colace M.D. [facility] FAAPM&R (Sports Med, Neuromuscular Med) Diplomate Am Board of Electrodiagnostic Med    Lynnae Prude 06/22/2018

## 2018-06-22 NOTE — Progress Notes (Signed)
Inpatient Diabetes Program Recommendations  AACE/ADA: New Consensus Statement on Inpatient Glycemic Control (2015)  Target Ranges:  Prepandial:   less than 140 mg/dL      Peak postprandial:   less than 180 mg/dL (1-2 hours)      Critically ill patients:  140 - 180 mg/dL   Lab Results  Component Value Date   GLUCAP 231 (H) 06/22/2018   HGBA1C 9.7 (H) 06/20/2018    Review of Glycemic Control Results for Lori Hardy, Lori Hardy (MRN [id number]) as of 06/22/2018 13:57  Ref. Range 06/21/2018 16:21 06/21/2018 20:06 06/22/2018 06:30 06/22/2018 07:52 06/22/2018 10:55  Glucose-Capillary Latest Ref Range: 70 - 99 mg/dL 829 (H) 562 (H) 130 (H) 197 (H) 231 (H)  Diabetes history: DM2 Outpatient Diabetes medications: Basaglar 14 units QHS, Glipizide 10 mg QAM, Actos 45 mg daily Current orders: Novolog sensitive tid with meals and HS, Lantus 14 units daily Inpatient Diabetes Program Recommendations:  Please restart Novolog meal coverage 3 units tid with meals as it appears that "order is complete" per [facility].    Thanks  Beryl Meager, RN, BC-ADM Inpatient Diabetes Coordinator Pager [phone] (8a-5p)

## 2018-06-22 NOTE — Progress Notes (Signed)
The Pt's BP at the beginning of shift was 189/78 in one arm and 171/65 in the other.  The only medicine ordered for high blood pressure was the scheduled metoprolol 25 mg twice a day.  The on call physician was informed and ordered daily Amlodipine 5 mg tablet by mouth, starting at 21:00.  The BP this morning was 158/69.  Will continue to monitor.  Harriet Masson, RN

## 2018-06-22 NOTE — Progress Notes (Addendum)
Family Medicine Teaching Service Daily Progress Note Intern Pager: [phone]  Patient name: Lori Hardy Medical record number: [id number] Date of birth: 02-04-1941 Age: 78 y.o. Gender: female  Primary Care Provider: Ron ParkerJenkins, Harvette C, MD (Inactive)  Consultants: Neurology, pharm consults, ID  Code Status: Full Code   Pt Overview and Major Events to Date:  Hospital Day: 5  Admitted: 06/18/2018 for CC: AMS  Assessment and Plan: Lori AfricaKamlaben Mckendree is a 78 y.o. right handed female presenting with altered mental status, last known well 2/7 afternoon, hyperglycemia and sepsis.  PMH is significant for DM, CKD 3, ACD, HLD, HTN, hypothyroidism, GERD, Leutscher's syndrome, positive ANA, primary open angle glaucoma, polyneuropathy, cirrhosis on imaging.  # Septic/Metabolic Encephalopathy  improving  Blood cx and CSF cxs negative.   Infection treatment as below   PT /OT   CIR approved pt for admission- attempted to call son for approval, no answer.  ? Rehab admissions coordinator to follow up per CIR note  BMP ordered at 0900.   #Right hemispheric dysfunction + increased risk of seizures   Switch IV Keppra to PO 500mg  BID  ? Pt will need o/p f/u    # Urosepsis  S/p cefepime+vanc+flayl x 2 doses (2/9-2/10). Enterococcus faecalis. Patient continues to improve with IV CTX. Will not change abx until sensitivities return.   Continue IV CTX (2/11 - 2/15) for total 7 days course  Follow up sensitivities   # IDDM 8 units lantus yesterday + 26 total units novolog  CBG TID WC + nighttime   Increase to 14 units lantus + mealtime 3u novolog   Likely continue to titrate lantus dose  DC recs: discontinue plioglitazone?  #Urinary retention and incontinence  No bladder scans charted in notes. Pure wick currently in place.   q8 bladder scan   Consider outpatient urology follow up if continues   #Acute Anemia  11.7>9.2  Continue to monitor on CBC - ordered at 0900 this morning   #  HTN Hypertensive to 170's-180's systolic.   Continue home metoprolol   Added home amlodipine   Adding home losartan 50mg    Titrate to 100mg  home dose tomorrow  DC recs: could increase amlodipine   # HLD  Continue home statin   #GERD  Continue home PTX  # AKI on CKD  AKI resolved  Cr 0.98 w/ improving GFR  Monitor Creatinine  Avoid nephrotoxic drugs  #Hypothyroidism   Continue home levothyroxine   #Glaucoma  Continue eye drops   # FEN . Fluids: none . Electrolytes: wnl  . Dys 3 diet  # DVT Prophylaxis . Lovenox 40  # Disposition . Trending Labs: BMP for Cr and CBC Hgb . CIR admission pending son/family approval and insurance  . Pt medically stable for discharge, though DM, HTN, and UTI will continue to need attention.  Subjective  Patient reports some abdominal pain this morning. [facility]   Objective   Vital Signs Intake/Output  Temp:  [97.7 F (36.5 C)-98.4 F (36.9 C)] 97.9 F (36.6 C) (02/13 0314) Pulse Rate:  [78-81] 78 (02/13 0314) Resp:  [16-19] 18 (02/13 0314) BP: (158-185)/(65-75) 158/69 (02/13 0314) SpO2:  [100 %] 100 % (02/13 0314)  Filed Weights   06/18/18 1400 06/18/18 1418  Weight: 66.2 kg 66.2 kg   Intake/Output      02/12 0701 - 02/13 0700   P.O. 1377   IV Piggyback 92.4   Total Intake(mL/kg) 1469.4 (22.2)   Urine (mL/kg/hr) 1780 (1.1)  Stool 1   Total Output 1781   Net -311.6       Urine Occurrence 5 x      Physical Exam  Gen: Lying in bed comfortably, alert - speaking [place], saying "bathroom"  Skin: Warm and dry HEENT: NCAT.  MMM.  CV: RRR.  Normal S1-S2. Resp: CTAB. No increased WOB Abd: Distended, nontender. Tympanic to percussion. + BS.  Neuro: No FND. Gait not appreciated.   Laboratory: No new results in last 24 hours  Imaging/Diagnostic Tests: No new imaging in last 24 hours.    Melene Plan, MD 06/22/2018, 6:24 AM PGY-1, [facility] Family Medicine FPTS Intern pager:  [phone], text pages welcome

## 2018-06-22 NOTE — Progress Notes (Signed)
Pt admitted to 18mw with family at bedside. Pt has no pain and is eating dinner. Continue plan of care.

## 2018-06-22 NOTE — Care Management Important Message (Signed)
Important Message  Patient Details  Name: Lori Hardy MRN: [id number] Date of Birth: 02/21/1941   Medicare Important Message Given:  Yes    Claryssa Sandner P Khaled Herda 06/22/2018, 1:10 PM

## 2018-06-22 NOTE — PMR Pre-admission (Signed)
PMR Admission Coordinator Pre-Admission Assessment  Patient: Lori Hardy is an 78 y.o., female MRN: [id number] DOB: 25-Dec-1940 Height: 5\' 4"  (162.6 cm) Weight: 66.2 kg              Insurance Information HMO:     PPO:      PCP:      IPA:      80/20:      OTHER:  PRIMARY: Medicaid [place] access      Policy#: [id number]      Subscriber: pt Benefits:  Phone #: passport one online     Name: 06/22/2018 Eff. Date: active 06/22/2018   [facility]  SECONDARY: Medicare part B only      Policy#: [id number]      Subscriber: pt Benefits:  Phone #: passport one online     Name: 06/22/2018 Eff. Date: 06/10/2012      Medicaid Application Date:       Case Manager:  Disability Application Date:       Case Worker:   Emergency Contact Information Contact Information    Name Relation Home Work [place] Son [phone]  [phone]   [facility] Daughter [phone]  [phone]     Current Medical History  Patient Admitting Diagnosis: metabolic encephalopathy  History of Present Illness:  Lori Hardy Patient is a 78 year old right handed limited English-speaking female with history of type 2 diabetes mellitus, hyperlipidemia, anemia chronic disease,CKD stage II.  Presented  06/18/2018 with complaints of headache, altered mental status, vague left side weakness and vomiting. Noted fever 104, lactic acid 3.5,urine study greater 100,000 enterococcus faecalis. Cranial CT scan negative for acute changes. Noted small remote lacunar infarction posteriorly in the right lentiform nucleus and left caudate head. MRI negative for acute changes. CT angiogram of head and neck with no dissection or aneurysm. Lumbar puncture completed glucose elevated 200, protein 55, Debbie BC of 8. Ultrasound the abdomen negative. Blood cultures no growth to date. Currently maintained on Rocephin for UTI 7 days. EEG negative for seizure. Maintained on Keppra for seizure prophylaxis. Neurology follow-up suspect septic metabolic  encephalopathy. Hospital course complicated by hypertensive crisis and monitored. Subcutaneous Lovenox for DVT prophylaxis.   Past Medical History  Past Medical History:  Diagnosis Date  . Aortic atherosclerosis (HCC) 12/15/2015   Overview:  By imaging, xray 11/2015.  . Closed fracture of right proximal humerus 04/06/2017  . Compression fracture of L4 lumbar vertebra, closed, initial encounter (HCC) 01/12/2017  . Diabetes mellitus without complication (HCC)   . Exposure to TB 09/26/2015  . 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  Differential includes diet, medications, possible pancreatic insufficiency.  Negative C. difficile 09/27/2015  . H. pylori infection 10/07/2016   Overview:  BX confirmed. 09/15/2016- Dr. Noe Gens, [facility].   Marland Kitchen History of compression fracture of spine 11/20/2015   Overview:  T12, on prolia.  Marland Kitchen History of Helicobacter pylori infection 11/20/2015   Overview:  By EGD 2017.  Treated with 2 courses of antibiotics at [facility].  . Hx of pancreatitis 09/26/2015   Overview:  08/2015 [facility]  . Hypertension   . Hyponatremia 04/18/2017  . 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 No active disease on chest xray 11/25/15. Pt from [place], past exposure to TB. Pt agreed to referral and treatment by [facility]- referral sent 12/15/2015 (telephone [phone], fax #[phone]).   . 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 Pseudophakia 06/11/2015  . Seasonal allergic rhinitis 09/02/2016  . Senile osteoporosis 06/26/2015   Overview:  Bone density 12/2014 [facility]. T-4.4 and -3.8. On prolia managed by Dr. Allena KatzPatel.  Normal intact PTH in 2017.  Marland Kitchen. Thrombocytopenia (HCC) 09/26/2015  . Xerosis  of skin 04/14/2016    Family History  family history is not on file.  Prior Rehab/Hospitalizations:  Has the patient had major surgery during 100 days prior to admission? No  Current Medications   Current Facility-Administered Medications:  .  acetaminophen (TYLENOL) tablet 650 mg, 650 mg, Oral, Q6H PRN **OR** acetaminophen (TYLENOL) suppository 650 mg, 650 mg, Rectal, Q6H PRN, Mullis, Kiersten P, DO .  albuterol (PROVENTIL) (2.5 MG/3ML) 0.083% nebulizer solution 2.5 mg, 2.5 mg, Nebulization, Q6H PRN, Melene PlanKim, Rachel E, MD .  amLODipine (NORVASC) tablet 5 mg, 5 mg, Oral, Daily, Mullis, Kiersten P, DO, 5 mg at 06/22/18 0846 .  amoxicillin (AMOXIL) capsule 500 mg, 500 mg, Oral, Q8H, Melene PlanKim, Rachel E, MD .  dorzolamide (TRUSOPT) 2 % ophthalmic solution 1 drop, 1 drop, Right Eye, BID, Mullis, Kiersten P, DO, 1 drop at 06/22/18 0846 .  enoxaparin (LOVENOX) injection 40 mg, 40 mg, Subcutaneous, QHS, Comanici, Oletta LamasKathryn H, Student-PharmD, 40 mg at 06/21/18 2140 .  feeding supplement (GLUCERNA SHAKE) (GLUCERNA SHAKE) liquid 237 mL, 237 mL, Oral, TID BM, Westley ChandlerBrown, Carina M, MD, 237 mL at 06/22/18 0847 .  insulin aspart (novoLOG) injection 0-5 Units, 0-5 Units, Subcutaneous, QHS, Garnette Gunnerhompson, Aaron B, MD, 2 Units at 06/21/18 2140 .  insulin aspart (novoLOG) injection 0-9 Units, 0-9 Units, Subcutaneous, TID WC, Garnette Gunnerhompson, Aaron B, MD, 3 Units at 06/22/18 1122 .  insulin glargine (LANTUS) injection 14 Units, 14 Units, Subcutaneous, Daily, Sandre Kittylson, Daniel K, MD, 14 Units at 06/22/18 [phone] .  latanoprost (XALATAN) 0.005 % ophthalmic solution 1 drop, 1 drop, Both Eyes, QHS, Mullis, Kiersten P, DO, 1 drop at 06/20/18 2113 .  levETIRAcetam (KEPPRA) tablet 500 mg, 500 mg, Oral, BID, Sandre Kittylson, Daniel K, MD, 500 mg at 06/22/18 0844 .  levothyroxine (SYNTHROID, LEVOTHROID) tablet 25 mcg, 25 mcg, Oral, QAC breakfast, Melene PlanKim, Rachel E, MD, 25 mcg at 06/22/18 [phone] .  losartan (COZAAR) tablet 50 mg, 50 mg, Oral, Daily, Melene PlanKim, Rachel E, MD, 50  mg at 06/22/18 1122 .  metoprolol tartrate (LOPRESSOR) tablet 25 mg, 25 mg, Oral, BID, Melene PlanKim, Rachel E, MD, 25 mg at 06/22/18 0845 .  multivitamin with minerals tablet 1 tablet, 1 tablet, Oral, Daily, Westley ChandlerBrown, Carina M, MD, 1 tablet at 06/22/18 0845 .  ondansetron (ZOFRAN) tablet 4 mg, 4 mg, Oral, Q6H PRN **OR** ondansetron (ZOFRAN) injection 4 mg, 4 mg, Intravenous, Q6H PRN, Mullis, Kiersten P, DO, 4 mg at 06/19/18 1328 .  pantoprazole (PROTONIX) EC tablet 40 mg, 40 mg, Oral, Daily, Melene PlanKim, Rachel E, MD, 40 mg at 06/22/18 0845 .  rosuvastatin (CRESTOR) tablet 10 mg, 10 mg, Oral, QHS, Melene PlanKim, Rachel E, MD, 10 mg at 06/21/18 2137 .  timolol (TIMOPTIC) 0.5 % ophthalmic solution 1 drop, 1 drop, Right Eye, Daily, Mullis, Kiersten P, DO, 1 drop at 06/22/18 0846  Patients Current Diet:  Diet Order            DIET DYS 3 Room service appropriate? Yes; Fluid consistency: Thin  Diet effective now  Precautions / Restrictions Precautions Precautions: Fall, Other (comment)(telemetry) Restrictions Weight Bearing Restrictions: No   Has the patient had 2 or more falls or a fall with injury in the past year?No  Prior Activity Level Limited Community (1-2x/wk): decline in function for past  year; sedentary. Daughter in law states a year ago pt was independent; cooking, etc. Now uses BSC in her room; uses walker and wheelchair. Has aide to assist with adls. Uses brief. Daughter in law contributes decline due to pt's age. Has fallen once 1 month ago getting to [facility].  Home Assistive Devices / Equipment Home Assistive Devices/Equipment: Bedside commode/3-in-1, Shower chair with back, Environmental consultant (specify type), Wheelchair  Prior Device Use: Indicate devices/aids used by the patient prior to current illness, exacerbation or injury? Manual wheelchair. RW  Prior Functional Level Prior Function Level of Independence: Needs assistance(supervision with RW and wheelchair use) Gait / Transfers Assistance Needed:  supervision with RW short distances ADL's / Homemaking Assistance Needed: supervision Communication / Swallowing Assistance Needed: supervision Comments: uses rollator or SPC usually  Self Care: Did the patient need help bathing, dressing, using the toilet or eating?  Needed some help  Indoor Mobility: Did the patient need assistance with walking from room to room (with or without device)? Needed some help  Stairs: Did the patient need assistance with internal or external stairs (with or without device)? Needed some help  Functional Cognition: Did the patient need help planning regular tasks such as shopping or remembering to take medications? Needed some help  Current Functional Level Cognition  Overall Cognitive Status: Impaired/Different from baseline Orientation Level: Oriented to person, Disoriented to place, Disoriented to time, Disoriented to situation Following Commands: Follows one step commands with increased time Safety/Judgement: Decreased awareness of safety, Decreased awareness of deficits General Comments: daughter in law translates; pt native language is Gujarati    Extremity Assessment (includes Sensation/Coordination)  Upper Extremity Assessment: Generalized weakness, LUE deficits/detail LUE Deficits / Details: Increased weakness  Lower Extremity Assessment: Generalized weakness, Defer to PT evaluation    ADLs  Overall ADL's : Needs assistance/impaired Eating/Feeding: Set up, Bed level Grooming: Minimal assistance, Bed level Upper Body Bathing: Moderate assistance Lower Body Bathing: Maximal assistance Upper Body Dressing : Moderate assistance Lower Body Dressing: Maximal assistance Toilet Transfer: (DNT) Toilet Transfer Details (indicate cue type and reason): unable to perform today due to nausea and plopping back onto bed with lateral leans and posterior lean. Functional mobility during ADLs: Maximal assistance, Rolling walker(maxA for power up and for  stabilizing) General ADL Comments: Increased time and effort required; unable to perform without mod to maxA    Mobility  Overal bed mobility: Needs Assistance Bed Mobility: Supine to Sit, Sit to Supine Supine to sit: Mod assist Sit to supine: Mod assist General bed mobility comments: mod to lift trunk and finish scooting to EOB, mod to lift legs and trunk back.  2 person assist to scoot up in bed    Transfers  Overall transfer level: Needs assistance Equipment used: Rolling walker (2 wheeled) Transfers: Sit to/from Stand Sit to Stand: Mod assist Stand pivot transfers: Max assist, From elevated surface(max due to gait fatigue) General transfer comment: cued sitting balance and control of push to stand    Ambulation / Gait / Stairs / Wheelchair Mobility  Ambulation/Gait Ambulation/Gait assistance: +2 physical assistance, +2 safety/equipment, Mod assist Gait Distance (Feet): 12 Feet Assistive device: Rolling walker (2 wheeled), 2 person hand held assist Gait Pattern/deviations: Step-to pattern, Step-through pattern, Decreased stride length, Wide  base of support, Trunk flexed, Decreased weight shift to left General Gait Details: pt is weak, requires dense cues for safety with both sitting and standing  Gait velocity: reduced Gait velocity interpretation: <1.8 ft/sec, indicate of risk for recurrent falls    Posture / Balance Balance Overall balance assessment: Needs assistance Sitting-balance support: Bilateral upper extremity supported Sitting balance-Leahy Scale: Fair Standing balance-Leahy Scale: Poor    Special needs/care consideration BiPAP/CPAP n/a CPM n/a Continuous Drip IV n/a Dialysis n/a Life Vest n/a Oxygen n/a Special Bed n/a Trach Size n/a Wound Vac n/a Skin intact Bowel mgmt: continent LBM 2/13 Bladder mgmt: external catheter Diabetic mgmt Hgb A1c 9.7 Gujarati interpreter services needed   Previous Home Environment Living Arrangements: Children(Lives with  son, son's wife and 468 and 79 year old Research scientist (physical sciences)grandchildr)  Lives With: Son Available Help at Discharge: Family, Available 24 hours/day(will hire additional assist) Type of Home: House Home Layout: Multi-level, Able to live on main level with bedroom/bathroom, Full bath on main level, Laundry or work area in basement Home Access: Stairs to enter Entrance Stairs-Rails: None Secretary/administratorntrance Stairs-Number of Steps: 3 Bathroom Shower/Tub: Health visitorWalk-in shower Bathroom Toilet: Standard Bathroom Accessibility: Yes How Accessible: Accessible via wheelchair, Accessible via walker Home Care Services: Yes Type of Home Care Services: Homehealth aide Home Care Agency (if known): [facility] Additional Comments: aide 1030 until 1230 daily to asisst with adls  Discharge Living Setting Plans for Discharge Living Setting: Lives with (comment)(son and son's family) Type of Home at Discharge: House Discharge Home Layout: Multi-level, Laundry or work area in basement, Full bath on main level, Able to live on main level with bedroom/bathroom Discharge Home Access: Stairs to enter Entrance Stairs-Rails: None Entrance Stairs-Number of Steps: 3 Discharge Bathroom Shower/Tub: Garment/textile technologistWalk-in shower Discharge Bathroom Toilet: Standard Discharge Bathroom Accessibility: Yes How Accessible: Accessible via wheelchair, Accessible via walker Does the patient have any problems obtaining your medications?: No  Social/Family/Support Systems Patient Roles: Parent Contact Information: son, Carmela Rimaarendra Thune Anticipated Caregiver: daughter in Social workerlaw, [place] and aide Anticipated Caregiver's Contact Information: son [phone] Azucena CecilSarika [phone] Ability/Limitations of Caregiver: son works days; daughter in Social workerlaw home Caregiver Availability: 24/7(daughter in law expresses that she has to pick up kids after) Discharge Plan Discussed with Primary Caregiver: Yes Is Caregiver In Agreement with Plan?: Yes Does Caregiver/Family have Issues with  Lodging/Transportation while Pt is in Rehab?: No  Goals/Additional Needs Patient/Family Goal for Rehab: supervision with PT, OT, and SLP Expected length of stay: [facility]OS 14 to 18 days Cultural Considerations: From [place] for [place] years; speaks no [place]; Hindi; preferred language us Gujarati; vegetarian Dietary Needs: Vegetarian Special Service Needs: Interpreteor needed; Gujarati Pt/Family Agrees to Admission and willing to participate: Yes Program Orientation Provided & Reviewed with Pt/Caregiver Including Roles  & Responsibilities: Yes  Decrease burden of Care through IP rehab admission: n/a  Possible need for SNF placement upon discharge: not anticipated  Patient Condition: This patient's condition remains as documented in the consult dated 06/21/2018, in which the Rehabilitation Physician determined and documented that the patient's condition is appropriate for intensive rehabilitative care in an inpatient rehabilitation facility. Will admit to inpatient rehab today.  Preadmission Screen Completed By:  Clois DupesBoyette, Tamakia Porto Godwin RN MSN, 06/22/2018 12:46 PM ______________________________________________________________________   Discussed status with Dr. Wynn BankerKirsteins on 06/22/2018 at  1257 and received telephone approval for admission today.  Admission Coordinator:  Clois DupesBoyette, Kodee Ravert Godwin RN MSN, time 1257 Date 06/22/2018

## 2018-06-22 NOTE — Progress Notes (Signed)
Marcello FennelPatel, Ankit Anil, MD  Physician  Physical Medicine and Rehabilitation  Consult Note  Addendum  Date of Service:  06/21/2018 12:37 PM       Related encounter: ED to Hosp-Admission (Current[facility]      Expand All Collapse All    Show:Clear all [x] Manual[x] Template[] Copied  Added by: [x] Angiulli, Mcarthur Rossettianiel J, PA-C[x] Marcello FennelPatel, Ankit Anil, MD  [] Hover for details      Physical Medicine and Rehabilitation Consult Reason for Consult: Left side weakness Referring Physician:   Family medicine   HPI: Lori Hardy is a 78 y.o.right handed non-English-speaking female with history of type 2 diabetes mellitus, hyperlipidemia, anemia of chronic disease,CKDstage II. Per chart review patient lives with son. Used a walker prior to admission. Son and family work during the day.  Daughter-in-law available.  Son provided simple meal preparation prior to going to work for patient. Presented  06/18/2018 with complaints of headache,altered mental status, left-sided weakness and vomiting. Noted fever 104,, lactic acid 3.5. Cranial CT scan negative for acute changes. Noted small remote lacunar infarction posteriorly in the right lentiform nucleus and left caudate head. MRI negative for acute changes. CT angiogram of head and neck with no dissection or aneurysm. Lumbar puncture completed glucose elevated at 200, protein 55 WBC of 8. Ultrasound the abdomen negative. Blood cultures no growth to date. Presently maintained on empiric antibiotics for UTI. EEG negative. Remains on Keppra for seizure prophylaxis. Neurology follow-up suspect septic metabolic encephalopathy.  Hospital course further complicated by hypertensive crisis.  Subcutaneous Lovenox for DVT prophylaxis. Therapy evaluation completed with recommendations of physical medicine rehabilitation consult.   Review of Systems  HENT: Negative for hearing loss.   Eyes: Negative for blurred vision and  double vision.  Respiratory: Negative for cough and shortness of breath.   Cardiovascular: Negative for chest pain and palpitations.  Gastrointestinal: Positive for nausea and vomiting.  Genitourinary: Negative for dysuria, flank pain and hematuria.  Musculoskeletal: Positive for myalgias.  Skin: Negative for rash.  Neurological: Positive for dizziness and headaches.  All other systems reviewed and are negative.      Past Medical History:  Diagnosis Date  . Aortic atherosclerosis (HCC) 12/15/2015   Overview:  By imaging, xray 11/2015.  . Closed fracture of right proximal humerus 04/06/2017  . Compression fracture of L4 lumbar vertebra, closed, initial encounter (HCC) 01/12/2017  . Diabetes mellitus without complication (HCC)   . Exposure to TB 09/26/2015  . 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  Differential includes diet, medications, possible pancreatic insufficiency.  Negative C. difficile 09/27/2015  . H. pylori infection 10/07/2016   Overview:  BX confirmed. 09/15/2016- Dr. Noe [facility]Peters[facility].   Marland Kitchen. History of compression fracture of spine 11/20/2015   Overview:  T12, on prolia.  Marland Kitchen. History of Helicobacter pylori infection 11/20/2015   Overview:  By EGD 2017.  [facility]  . Hx of pancreatitis 09/26/2015   Overview:  08/2015 [facility]  . Hypertension   . Hyponatremia 04/18/2017  . 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 No active disease on chest xray 11/25/15. Pt from [place], past exposure to TB. Pt agreed to referral and treatment by [facility]- referral sent 12/15/2015 (telephone [phone], fax #  [phone]).   . 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 Postsurgical states following surgery  of eye and adnexa 07/18/2015  . Pseudophakia 06/11/2015  . Seasonal allergic rhinitis 09/02/2016  . Senile osteoporosis 06/26/2015   Overview:  Bone density 12/2014 [facility]. T-4.4 and -3.8. On prolia managed by Dr. Allena Katz.  Normal intact PTH in 2017.  Marland Kitchen Thrombocytopenia (HCC) 09/26/2015  . Xerosis of skin 04/14/2016        Past Surgical History:  Procedure Laterality Date  . cataracts     No pertinent family history of metabolic encephalopathy. Social History:  reports that she has never smoked. She has never used smokeless tobacco. No history on file for alcohol and drug. Allergies:       Allergies  Allergen Reactions  . Brimonidine Itching    Redness and itching eye Redness and itching Redness and itching          Medications Prior to Admission  Medication Sig Dispense Refill  . amLODipine (NORVASC) 5 MG tablet Take 5 mg by mouth daily.     . Cholecalciferol (VITAMIN D3) 25 MCG (1000 UT) CAPS Take 1,000 mg by mouth daily.    . diphenoxylate-atropine (LOMOTIL) 2.5-0.025 MG tablet Take 1 tablet by mouth 4 (four) times daily as needed.    . dorzolamide (TRUSOPT) 2 % ophthalmic solution Place 1 drop into the right eye 2 (two) times daily.    Marland Kitchen esomeprazole (NEXIUM) 40 MG capsule Take 40 mg daily at 12 noon by mouth.    Marland Kitchen glipiZIDE (GLUCOTROL) 10 MG tablet Take 10 mg daily before breakfast by mouth.    . Insulin Glargine (BASAGLAR KWIKPEN) 100 UNIT/ML SOPN Inject 14 Units into the skin at bedtime.    Marland Kitchen latanoprost (XALATAN) 0.005 % ophthalmic solution Place 1 drop into both eyes at bedtime.    Marland Kitchen levothyroxine (SYNTHROID, LEVOTHROID) 25 MCG tablet Take 25 mcg daily before breakfast by mouth.    . loratadine (CLARITIN) 10 MG tablet Take 10 mg by mouth daily.    Marland Kitchen losartan (COZAAR) 100 MG tablet Take 100 mg daily by mouth.    . Melatonin 1 MG TABS Take 1 mg by mouth at bedtime.    . metoprolol tartrate (LOPRESSOR) 25 MG tablet Take 25 mg by mouth 2 (two) times  daily.    . ondansetron (ZOFRAN) 8 MG tablet Take every 8 (eight) hours as needed by mouth for nausea or vomiting.    . pioglitazone (ACTOS) 45 MG tablet Take 45 mg daily by mouth.    . rosuvastatin (CRESTOR) 10 MG tablet Take 10 mg by mouth at bedtime.     . timolol (TIMOPTIC) 0.5 % ophthalmic solution Place 1 drop into the right eye daily.    . traMADol (ULTRAM) 50 MG tablet Take 50 mg by mouth every 6 (six) hours as needed for pain.      Home: Home Living Family/patient expects to be discharged to:: Inpatient rehab Additional Comments: Pt's daughter in law expresses concern as family is not available 24/7 at home.  Functional History: Prior Function Level of Independence: Independent with assistive device(s) Comments: uses rollator or SPC usually Functional Status:  Mobility: Bed Mobility Overal bed mobility: Needs Assistance Bed Mobility: Supine to Sit, Sit to Supine Supine to sit: Mod assist Sit to supine: Mod assist General bed mobility comments: mod to lift trunk and finish scooting to EOB, mod to lift legs and trunk back.  2 person assist to scoot up in bed Transfers Overall transfer level: Needs  assistance Equipment used: Rolling walker (2 wheeled) Transfers: Sit to/from Stand Sit to Stand: Mod assist Stand pivot transfers: Max assist, From elevated surface(max due to gait fatigue) General transfer comment: cued sitting balance and control of push to stand Ambulation/Gait Ambulation/Gait assistance: +2 physical assistance, +2 safety/equipment, Mod assist Gait Distance (Feet): 12 Feet Assistive device: Rolling walker (2 wheeled), 2 person hand held assist Gait Pattern/deviations: Step-to pattern, Step-through pattern, Decreased stride length, Wide base of support, Trunk flexed, Decreased weight shift to left General Gait Details: pt is weak, requires dense cues for safety with both sitting and standing  Gait velocity: reduced Gait velocity interpretation:  <1.8 ft/sec, indicate of risk for recurrent falls  ADL: ADL Overall ADL's : Needs assistance/impaired Eating/Feeding: Set up, Bed level Grooming: Minimal assistance, Bed level Upper Body Bathing: Moderate assistance Lower Body Bathing: Maximal assistance Upper Body Dressing : Moderate assistance Lower Body Dressing: Maximal assistance Toilet Transfer: (DNT) Toilet Transfer Details (indicate cue type and reason): unable to perform today due to nausea and plopping back onto bed with lateral leans and posterior lean. Functional mobility during ADLs: Maximal assistance, Rolling walker(maxA for power up and for stabilizing) General ADL Comments: Increased time and effort required; unable to perform without mod to maxA  Cognition: Cognition Overall Cognitive Status: Impaired/Different from baseline Orientation Level: Oriented to person, Disoriented to time, Disoriented to situation, Oriented to place Cognition Arousal/Alertness: Lethargic Behavior During Therapy: Flat affect Overall Cognitive Status: Impaired/Different from baseline Area of Impairment: Following commands, Safety/judgement, Awareness, Problem solving Following Commands: Follows one step commands with increased time Safety/Judgement: Decreased awareness of safety, Decreased awareness of deficits General Comments: Daughter in law cannot translate well  Blood pressure (!) 167/75, pulse (!) 103, temperature 97.8 F (36.6 C), temperature source Oral, resp. rate 19, height 5\' 4"  (1.626 m), weight 66.2 kg, SpO2 100 %. Physical Exam  Constitutional: She appears well-developed and well-nourished.  HENT:  Head: Normocephalic and atraumatic.  Eyes: EOM are normal. Right eye exhibits no discharge. Left eye exhibits no discharge.  Neck: Normal range of motion. Neck supple.  Cardiovascular: Normal rate and regular rhythm.  Respiratory: Effort normal and breath sounds normal.  GI: Soft. Bowel sounds are normal.  Musculoskeletal:      Comments: No edema or tenderness in extremities  Neurological: She is alert.  Patient is alert non-English-speaking.  Son at bedside.  Inconsistently follows some simple demonstrated commands.  Motor: Limited, due to participation, but appears to be 4-/5 grossly throughout Alert and confused.  Skin: Skin is warm and dry.  Psychiatric: Her mood appears anxious. Her speech is tangential. Cognition and memory are impaired.    LabResultsLast24Hours       Results for orders placed or performed during the hospital encounter of 06/18/18 (from the past 24 hour(s))  Glucose, capillary     Status: Abnormal   Collection Time: 06/20/18  4:56 PM  Result Value Ref Range   Glucose-Capillary 193 (H) 70 - 99 mg/dL  Glucose, capillary     Status: Abnormal   Collection Time: 06/20/18  9:05 PM  Result Value Ref Range   Glucose-Capillary 190 (H) 70 - 99 mg/dL  Glucose, capillary     Status: Abnormal   Collection Time: 06/20/18  9:24 PM  Result Value Ref Range   Glucose-Capillary 187 (H) 70 - 99 mg/dL  Basic metabolic panel     Status: Abnormal   Collection Time: 06/21/18  3:25 AM  Result Value Ref Range   Sodium 134 (L) 135 -  145 mmol/L   Potassium 3.6 3.5 - 5.1 mmol/L   Chloride 106 98 - 111 mmol/L   CO2 17 (L) 22 - 32 mmol/L   Glucose, Bld 326 (H) 70 - 99 mg/dL   BUN 14 8 - 23 mg/dL   Creatinine, Ser 1.610.98 0.44 - 1.00 mg/dL   Calcium 8.1 (L) 8.9 - 10.3 mg/dL   GFR calc non Af Amer 55 (L) >60 mL/min   GFR calc Af Amer >60 >60 mL/min   Anion gap 11 5 - 15  CBC     Status: Abnormal   Collection Time: 06/21/18  3:25 AM  Result Value Ref Range   WBC 6.8 4.0 - 10.5 K/uL   RBC 3.19 (L) 3.87 - 5.11 MIL/uL   Hemoglobin 9.2 (L) 12.0 - 15.0 g/dL   HCT 09.628.9 (L) 04.536.0 - 40.946.0 %   MCV 90.6 80.0 - 100.0 fL   MCH 28.8 26.0 - 34.0 pg   MCHC 31.8 30.0 - 36.0 g/dL   RDW 81.115.6 (H) 91.411.5 - 78.215.5 %   Platelets 153 150 - 400 K/uL   nRBC 0.0 0.0 - 0.2 %  Glucose, capillary      Status: Abnormal   Collection Time: 06/21/18  6:24 AM  Result Value Ref Range   Glucose-Capillary 347 (H) 70 - 99 mg/dL  Glucose, capillary     Status: Abnormal   Collection Time: 06/21/18 11:20 AM  Result Value Ref Range   Glucose-Capillary 272 (H) 70 - 99 mg/dL   Comment 1 Notify RN      ImagingResults(Last48hours)  No results found.    Assessment/Plan: Diagnosis: Metabolic encephalopathy Labs independently reviewed.  Records reviewed and summated above.  1. Does the need for close, 24 hr/day medical supervision in concert with the patient's rehab needs make it unreasonable for this patient to be served in a less intensive setting? Yes  2. Co-Morbidities requiring supervision/potential complications:  type 2 DM (Monitor in accordance with exercise and adjust meds as necessary), hyperlipidemia, anemia of chronic disease (repeat labs, transfuse to ensure appropriate perfusion for increased activity tolerance),CKD stage II (avoid nephrotoxic meds), hypertensive crisis (monitor and provide prns in accordance with increased physical exertion and pain), hyponatremia (continue to monitor, treat if necessary) 3. Due to safety, disease management, medication administration and patient education, does the patient require 24 hr/day rehab nursing? Yes 4. Does the patient require coordinated care of a physician, rehab nurse, PT (1-2 hrs/day, 5 days/week), OT (1-2 hrs/day, 5 days/week) and SLP (1-2 hrs/day, 5 days/week) to address physical and functional deficits in the context of the above medical diagnosis(es)? Yes Addressing deficits in the following areas: balance, endurance, locomotion, strength, transferring, bathing, dressing, toileting, cognition and psychosocial support 5. Can the patient actively participate in an intensive therapy program of at least 3 hrs of therapy per day at least 5 days per week? Yes 6. The potential for patient to make measurable gains while on inpatient  rehab is excellent 7. Anticipated functional outcomes upon discharge from inpatient rehab are supervision  with PT, supervision with OT, supervision with SLP. 8. Estimated rehab length of stay to reach the above functional goals is: 14-18 days. 9. Anticipated D/C setting: Home 10. Anticipated post D/C treatments: HH therapy and Home excercise program 11. Overall Rehab/Functional Prognosis: excellent and good  RECOMMENDATIONS: This patient's condition is appropriate for continued rehabilitative care in the following setting: CIR when medically appropriate. Patient has agreed to participate in recommended program. Potentially Note that insurance prior authorization  may be required for reimbursement for recommended care.  Comment: Rehab Admissions Coordinator to follow up.   I have personally performed a face to face diagnostic evaluation, including, but not limited to relevant history and physical exam findings, of this patient and developed relevant assessment and plan.  Additionally, I have reviewed and concur with the physician assistant's documentation above.   Maryla Morrow, MD, ABPMR Mcarthur Rossetti Angiulli, PA-C 06/21/2018    Revision History                             Routing History

## 2018-06-22 NOTE — Progress Notes (Signed)
Standley BrookingBoyette, Kjell Brannen G, RN  Rehab Admission Coordinator  Physical Medicine and Rehabilitation  PMR Pre-admission  Signed  Date of Service:  06/22/2018 12:46 PM       Related encounter: ED [facility]Ad[facility] (Current[facility]      Signed         Show:Clear all [x] Manual[x] Template[x] Copied  Added by: [x] Standley BrookingBoyette, Travis Mastel G, RN  [] Hover for details PMR Admission Coordinator Pre-Admission Assessment  Patient: Lori AfricaKamlaben Hardy is an 78 y.o., female MRN: [id number] DOB: Jun 04, 1940 Height: 5\' 4"  (162.6 cm) Weight: 66.2 kg                                                                                                                                                  Insurance Information HMO:     PPO:      PCP:      IPA:      80/20:      OTHER:  PRIMARY: Medicaid [place] access      Policy#: [id number]      Subscriber: pt Benefits:  Phone #: passport one online     Name: 06/22/2018 Eff. Date: active 06/22/2018   [facility]  SECONDARY: Medicare part B only      Policy#: [id number]      Subscriber: pt Benefits:  Phone #: passport one online     Name: 06/22/2018 Eff. Date: 06/10/2012      Medicaid Application Date:       Case Manager:  Disability Application Date:       Case Worker:   Emergency Contact Information         Contact Information    Name Relation Home Work [place]   Brightwell,Narendra Son [phone]  [phone]   Butler HospitalATEL,SARIKA Daughter [phone]  [phone]     Current Medical History  Patient Admitting Diagnosis: metabolic encephalopathy  History of Present Illness: Lori Hardy is a 78 year old right handed limited English-speaking female with history of type 2 diabetes mellitus, hyperlipidemia, anemia chronic disease,CKD stage II.  Presented 06/18/2018 with complaints of headache, altered mental status, vague left side weakness and vomiting. Noted fever 104, lactic acid 3.5,urine study greater 100,000  enterococcus faecalis. Cranial CT scan negative for acute changes. Noted small remote lacunar infarction posteriorly in the right lentiform nucleus and left caudate head. MRI negative for acute changes. CT angiogram of head and neck with no dissection or aneurysm. Lumbar puncture completed glucose elevated 200, protein 55, Debbie BC of 8. Ultrasound the abdomen negative. Blood cultures no growth to date. Currently maintained on Rocephin for UTI 7 days. EEG negative for seizure. Maintained on Keppra for seizure prophylaxis. Neurology follow-up suspect septic metabolic encephalopathy. Hospital course complicated by hypertensive crisis and monitored. Subcutaneous Lovenox for DVT prophylaxis.   Past Medical History      Past Medical History:  Diagnosis Date  .  Aortic atherosclerosis (HCC) 12/15/2015   Overview:  By imaging, xray 11/2015.  . Closed fracture of right proximal humerus 04/06/2017  . Compression fracture of L4 lumbar vertebra, closed, initial encounter (HCC) 01/12/2017  . Diabetes mellitus without complication (HCC)   . Exposure to TB 09/26/2015  . 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  Differential includes diet, medications, possible pancreatic insufficiency.  Negative C. difficile 09/27/2015  . H. pylori infection 10/07/2016   Overview:  BX confirmed. 09/15/2016- Dr. Noe Gens, [facility].   Marland Kitchen History of compression fracture of spine 11/20/2015   Overview:  T12, on prolia.  Marland Kitchen History of Helicobacter pylori infection 11/20/2015   Overview:  By EGD 2017.  Treated with 2 courses of antibiotics at [facility].  . Hx of pancreatitis 09/26/2015   Overview:  08/2015 [facility]  . Hypertension   . Hyponatremia 04/18/2017  . 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 QuantiFERON TB Gold positive x 2 (09/29/15 and  11/25/15). No active disease on chest xray 11/25/15. Pt from [place], past exposure to TB. Pt agreed to referral and treatment by [facility]- referral sent 12/15/2015 (telephone [phone], fax #[phone]).   . 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 Pseudophakia 06/11/2015  . Seasonal allergic rhinitis 09/02/2016  . Senile osteoporosis 06/26/2015   Overview:  Bone density 12/2014 [facility]. T-4.4 and -3.8. On prolia managed by Dr. Allena Katz.  Normal intact PTH in 2017.  Marland Kitchen Thrombocytopenia (HCC) 09/26/2015  . Xerosis of skin 04/14/2016    Family History  family history is not on file.  Prior Rehab/Hospitalizations:  Has the patient had major surgery during 100 days prior to admission? No  Current Medications   Current Facility-Administered Medications:  .  acetaminophen (TYLENOL) tablet 650 mg, 650 mg, Oral, Q6H PRN **OR** acetaminophen (TYLENOL) suppository 650 mg, 650 mg, Rectal, Q6H PRN, Mullis, Kiersten P, DO .  albuterol (PROVENTIL) (2.5 MG/3ML) 0.083% nebulizer solution 2.5 mg, 2.5 mg, Nebulization, Q6H PRN, Melene Plan, MD .  amLODipine (NORVASC) tablet 5 mg, 5 mg, Oral, Daily, Mullis, Kiersten P, DO, 5 mg at 06/22/18 0846 .  amoxicillin (AMOXIL) capsule 500 mg, 500 mg, Oral, Q8H, Melene Plan, MD .  dorzolamide (TRUSOPT) 2 % ophthalmic solution 1 drop, 1 drop, Right Eye, BID, Mullis, Kiersten P, DO, 1 drop at 06/22/18 0846 .  enoxaparin (LOVENOX) injection 40 mg, 40 mg, Subcutaneous, QHS, Comanici, Oletta Lamas, Student-PharmD, 40 mg at 06/21/18 2140 .  feeding supplement (GLUCERNA SHAKE) (GLUCERNA SHAKE) liquid 237 mL, 237 mL, Oral, TID BM, Westley Chandler, MD, 237 mL at 06/22/18 0847 .  insulin aspart (novoLOG) injection 0-5 Units, 0-5 Units, Subcutaneous, QHS, Garnette Gunner, MD, 2 Units at 06/21/18 2140 .  insulin aspart (novoLOG) injection 0-9 Units, 0-9 Units, Subcutaneous, TID WC, Garnette Gunner, MD, 3  Units at 06/22/18 1122 .  insulin glargine (LANTUS) injection 14 Units, 14 Units, Subcutaneous, Daily, Sandre Kitty, MD, 14 Units at 06/22/18 [phone] .  latanoprost (XALATAN) 0.005 % ophthalmic solution 1 drop, 1 drop, Both Eyes, QHS, Mullis, Kiersten P, DO, 1 drop at 06/20/18 2113 .  levETIRAcetam (KEPPRA) tablet 500 mg, 500 mg, Oral, BID, Sandre Kitty, MD, 500 mg at 06/22/18 0844 .  levothyroxine (SYNTHROID, LEVOTHROID) tablet 25 mcg, 25  mcg, Oral, QAC breakfast, Melene Plan, MD, 25 mcg at 06/22/18 [phone] .  losartan (COZAAR) tablet 50 mg, 50 mg, Oral, Daily, Melene Plan, MD, 50 mg at 06/22/18 1122 .  metoprolol tartrate (LOPRESSOR) tablet 25 mg, 25 mg, Oral, BID, Melene Plan, MD, 25 mg at 06/22/18 0845 .  multivitamin with minerals tablet 1 tablet, 1 tablet, Oral, Daily, Westley Chandler, MD, 1 tablet at 06/22/18 0845 .  ondansetron (ZOFRAN) tablet 4 mg, 4 mg, Oral, Q6H PRN **OR** ondansetron (ZOFRAN) injection 4 mg, 4 mg, Intravenous, Q6H PRN, Mullis, Kiersten P, DO, 4 mg at 06/19/18 1328 .  pantoprazole (PROTONIX) EC tablet 40 mg, 40 mg, Oral, Daily, Melene Plan, MD, 40 mg at 06/22/18 0845 .  rosuvastatin (CRESTOR) tablet 10 mg, 10 mg, Oral, QHS, Melene Plan, MD, 10 mg at 06/21/18 2137 .  timolol (TIMOPTIC) 0.5 % ophthalmic solution 1 drop, 1 drop, Right Eye, Daily, Mullis, Kiersten P, DO, 1 drop at 06/22/18 0846  Patients Current Diet:     Diet Order                  DIET DYS 3 Room service appropriate? Yes; Fluid consistency: Thin  Diet effective now               Precautions / Restrictions Precautions Precautions: Fall, Other (comment)(telemetry) Restrictions Weight Bearing Restrictions: No   Has the patient had 2 or more falls or a fall with injury in the past year?No  Prior Activity Level Limited Community (1-2x/wk): decline in function for past  year; sedentary. Daughter in law states a year ago pt was independent; cooking, etc. Now uses BSC in her  room; uses walker and wheelchair. Has aide to assist with adls. Uses brief. Daughter in law contributes decline due to pt's age. Has fallen once 1 month ago getting to [facility].  Home Assistive Devices / Equipment Home Assistive Devices/Equipment: Bedside commode/3-in-1, Shower chair with back, Environmental consultant (specify type), Wheelchair  Prior Device Use: Indicate devices/aids used by the patient prior to current illness, exacerbation or injury? Manual wheelchair. RW  Prior Functional Level Prior Function Level of Independence: Needs assistance(supervision with RW and wheelchair use) Gait / Transfers Assistance Needed: supervision with RW short distances ADL's / Homemaking Assistance Needed: supervision Communication / Swallowing Assistance Needed: supervision Comments: uses rollator or SPC usually  Self Care: Did the patient need help bathing, dressing, using the toilet or eating?  Needed some help  Indoor Mobility: Did the patient need assistance with walking from room to room (with or without device)? Needed some help  Stairs: Did the patient need assistance with internal or external stairs (with or without device)? Needed some help  Functional Cognition: Did the patient need help planning regular tasks such as shopping or remembering to take medications? Needed some help  Current Functional Level Cognition  Overall Cognitive Status: Impaired/Different from baseline Orientation Level: Oriented to person, Disoriented to place, Disoriented to time, Disoriented to situation Following Commands: Follows one step commands with increased time Safety/Judgement: Decreased awareness of safety, Decreased awareness of deficits General Comments: daughter in law translates; pt native language is Gujarati    Extremity Assessment (includes Sensation/Coordination)  Upper Extremity Assessment: Generalized weakness, LUE deficits/detail LUE Deficits / Details: Increased weakness  Lower Extremity  Assessment: Generalized weakness, Defer to PT evaluation    ADLs  Overall ADL's : Needs assistance/impaired Eating/Feeding: Set up, Bed level Grooming: Minimal assistance, Bed level Upper Body Bathing: Moderate assistance Lower  Body Bathing: Maximal assistance Upper Body Dressing : Moderate assistance Lower Body Dressing: Maximal assistance Toilet Transfer: (DNT) Toilet Transfer Details (indicate cue type and reason): unable to perform today due to nausea and plopping back onto bed with lateral leans and posterior lean. Functional mobility during ADLs: Maximal assistance, Rolling walker(maxA for power up and for stabilizing) General ADL Comments: Increased time and effort required; unable to perform without mod to maxA    Mobility  Overal bed mobility: Needs Assistance Bed Mobility: Supine to Sit, Sit to Supine Supine to sit: Mod assist Sit to supine: Mod assist General bed mobility comments: mod to lift trunk and finish scooting to EOB, mod to lift legs and trunk back.  2 person assist to scoot up in bed    Transfers  Overall transfer level: Needs assistance Equipment used: Rolling walker (2 wheeled) Transfers: Sit to/from Stand Sit to Stand: Mod assist Stand pivot transfers: Max assist, From elevated surface(max due to gait fatigue) General transfer comment: cued sitting balance and control of push to stand    Ambulation / Gait / Stairs / Wheelchair Mobility  Ambulation/Gait Ambulation/Gait assistance: +2 physical assistance, +2 safety/equipment, Mod assist Gait Distance (Feet): 12 Feet Assistive device: Rolling walker (2 wheeled), 2 person hand held assist Gait Pattern/deviations: Step-to pattern, Step-through pattern, Decreased stride length, Wide base of support, Trunk flexed, Decreased weight shift to left General Gait Details: pt is weak, requires dense cues for safety with both sitting and standing  Gait velocity: reduced Gait velocity interpretation: <1.8  ft/sec, indicate of risk for recurrent falls    Posture / Balance Balance Overall balance assessment: Needs assistance Sitting-balance support: Bilateral upper extremity supported Sitting balance-Leahy Scale: Fair Standing balance-Leahy Scale: Poor    Special needs/care consideration BiPAP/CPAP n/a CPM n/a Continuous Drip IV n/a Dialysis n/a Life Vest n/a Oxygen n/a Special Bed n/a Trach Size n/a Wound Vac n/a Skin intact Bowel mgmt: continent LBM 2/13 Bladder mgmt: external catheter Diabetic mgmt Hgb A1c 9.7 Gujarati interpreter services needed   Previous Home Environment Living Arrangements: Children(Lives with son, son's wife and 31 and 62 year old Research scientist (physical sciences))  Lives With: Son Available Help at Discharge: Family, Available 24 hours/day(will hire additional assist) Type of Home: House Home Layout: Multi-level, Able to live on main level with bedroom/bathroom, Full bath on main level, Laundry or work area in basement Home Access: Stairs to enter Entrance Stairs-Rails: None Secretary/administrator of Steps: 3 Bathroom Shower/Tub: Health visitor: Standard Bathroom Accessibility: Yes How Accessible: Accessible via wheelchair, Accessible via walker Home Care Services: Yes Type of Home Care Services: Homehealth aide Home Care Agency (if known): [facility] Additional Comments: aide 1030 until 1230 daily to asisst with adls  Discharge Living Setting Plans for Discharge Living Setting: Lives with (comment)(son and son's family) Type of Home at Discharge: House Discharge Home Layout: Multi-level, Laundry or work area in basement, Full bath on main level, Able to live on main level with bedroom/bathroom Discharge Home Access: Stairs to enter Entrance Stairs-Rails: None Entrance Stairs-Number of Steps: 3 Discharge Bathroom Shower/Tub: Walk-in shower Discharge Bathroom Toilet: Standard Discharge Bathroom Accessibility: Yes How Accessible: Accessible  via wheelchair, Accessible via walker Does the patient have any problems obtaining your medications?: No  Social/Family/Support Systems Patient Roles: Parent Contact Information: son, Raigen Roopnarine Anticipated Caregiver: daughter in law, [place] and aide Anticipated Caregiver's Contact Information: son [phone] Azucena Cecil [phone] Ability/Limitations of Caregiver: son works days; daughter in Social worker home Caregiver Availability: 24/7(daughter in law expresses that  she has to pick up kids after) Discharge Plan Discussed with Primary Caregiver: Yes Is Caregiver In Agreement with Plan?: Yes Does Caregiver/Family have Issues with Lodging/Transportation while Pt is in Rehab?: No  Goals/Additional Needs Patient/Family Goal for Rehab: supervision with PT, OT, and SLP Expected length of stay: ELOS 14 to 18 days Cultural Considerations: From [place] for 10 years; speaks no [place]; Hindi; preferred language [place] Gujarati; vegetarian Dietary Needs: Vegetarian Special Service Needs: Interpreteor needed; Gujarati Pt/Family Agrees to Admission and willing to participate: Yes Program Orientation Provided & Reviewed with Pt/Caregiver Including Roles  & Responsibilities: Yes  Decrease burden of Care through IP rehab admission: n/a  Possible need for SNF placement upon discharge: not anticipated  Patient Condition: This patient's condition remains as documented in the consult dated 06/21/2018, in which the Rehabilitation Physician determined and documented that the patient's condition is appropriate for intensive rehabilitative care in an inpatient rehabilitation facility. Will admit to inpatient rehab today.  Preadmission Screen Completed By:  Clois Dupes RN MSN, 06/22/2018 12:46 PM ______________________________________________________________________   Discussed status with Dr. Wynn Banker on 06/22/2018 at  1257 and received telephone approval for admission today.  Admission Coordinator:   Clois Dupes RN MSN, time 1257 Date 06/22/2018           Cosigned by: Erick Colace, MD at 06/22/2018 1:02 PM  Revision History

## 2018-06-22 NOTE — Progress Notes (Addendum)
Inpatient Rehabilitation Admissions Coordinator  I met with patient with her daughter in law at bedside translating our discussion. I discussed goals and expectations of an inpt rehab admit. Lambert Keto, daughter in law, defers preferences for rehab to her husband. I have left [name], son, a message to contact me to discuss his Mother's rehab needs and options. I will await call back to further define dispo.  Danne Baxter, RN, MSN Rehab Admissions Coordinator [phone] 06/22/2018 11:20 AM   I spoke with pt's son, [name], by phone and he is in agreement to admit pt to inpt rehab today. We discussed the difference in CIR vs SNF rehab and he prefers an inpt rehab admit. I will conatct Dr. Maudie Mercury to assist with arranging d/c to CIR today. I will update RN CM, Kristi  Danne Baxter, RN, MSN Rehab Admissions Coordinator [phone] 06/22/2018 11:44 AM

## 2018-06-22 NOTE — Care Management Note (Signed)
Case Management Note Donn Pierini RN, BSN Transitions of Care Unit 4E- RN Case Manager [phone]  Patient Details  Name: Lori Hardy MRN: [id number] Date of Birth: Sep 13, 1940  Subjective/Objective:   Pt admitted with septic UTI, metabolic encephalopathy,  IDDM, HTN              Action/Plan: PTA pt lived at home with son/family, pt does not speak english (preferred language- Hindi)- Per PT eval- recommendation for CIR, consult placed.   Expected Discharge Date:  06/22/18               Expected Discharge Plan:  IP Rehab Facility  In-House Referral:  Clinical Social Work  Discharge planning Services  CM Consult  Post Acute Care Choice:  IP Rehab Choice offered to:  Adult Children  DME Arranged:    DME Agency:     HH Arranged:    HH Agency:     Status of Service:  Completed, signed off  If discussed at Long Length of Stay Meetings, dates discussed:    Discharge Disposition: IP rehab   Additional Comments:  06/22/18- 1400- Donn Pierini RN, CM- spoke with Britta Mccreedy from CIR who has spoken with pt's son- son is agreeable to CIR for pt and CIR is ready with bed offer today. Pt is medically stable for transition to IP rehab. Plan to discharge later today to [facility] IP rehab.   Darrold Span, RN 06/22/2018, 2:15 PM

## 2018-06-23 ENCOUNTER — Inpatient Hospital Stay (HOSPITAL_COMMUNITY): Payer: Medicare Other | Admitting: Physical Therapy

## 2018-06-23 ENCOUNTER — Inpatient Hospital Stay (HOSPITAL_COMMUNITY): Payer: Medicare Other

## 2018-06-23 ENCOUNTER — Inpatient Hospital Stay (HOSPITAL_COMMUNITY): Payer: Medicare Other | Admitting: Speech Pathology

## 2018-06-23 DIAGNOSIS — E119 Type 2 diabetes mellitus without complications: Secondary | ICD-10-CM

## 2018-06-23 DIAGNOSIS — I10 Essential (primary) hypertension: Secondary | ICD-10-CM

## 2018-06-23 DIAGNOSIS — D638 Anemia in other chronic diseases classified elsewhere: Secondary | ICD-10-CM | POA: Diagnosis not present

## 2018-06-23 DIAGNOSIS — G9341 Metabolic encephalopathy: Secondary | ICD-10-CM | POA: Diagnosis not present

## 2018-06-23 DIAGNOSIS — R5381 Other malaise: Secondary | ICD-10-CM | POA: Diagnosis not present

## 2018-06-23 DIAGNOSIS — E46 Unspecified protein-calorie malnutrition: Secondary | ICD-10-CM

## 2018-06-23 LAB — CULTURE, BLOOD (ROUTINE X 2)
Culture: NO GROWTH
Culture: NO GROWTH
Special Requests: ADEQUATE
Special Requests: ADEQUATE

## 2018-06-23 LAB — COMPREHENSIVE METABOLIC PANEL
ALT: 31 U/L (ref 0–44)
AST: 38 U/L (ref 15–41)
Albumin: 2.2 g/dL — ABNORMAL LOW (ref 3.5–5.0)
Alkaline Phosphatase: 91 U/L (ref 38–126)
Anion gap: 10 (ref 5–15)
BUN: 8 mg/dL (ref 8–23)
CHLORIDE: 104 mmol/L (ref 98–111)
CO2: 22 mmol/L (ref 22–32)
Calcium: 8.3 mg/dL — ABNORMAL LOW (ref 8.9–10.3)
Creatinine, Ser: 0.91 mg/dL (ref 0.44–1.00)
GFR calc Af Amer: >60 mL/min (ref >60)
GFR calc non Af Amer: >60 mL/min (ref >60)
Glucose, Bld: 242 mg/dL — ABNORMAL HIGH (ref 70–99)
Potassium: 4.1 mmol/L (ref 3.5–5.1)
Sodium: 136 mmol/L (ref 135–145)
Total Bilirubin: 0.4 mg/dL (ref 0.3–1.2)
Total Protein: 5.6 g/dL — ABNORMAL LOW (ref 6.5–8.1)

## 2018-06-23 LAB — CBC WITH DIFFERENTIAL/PLATELET
Abs Immature Granulocytes: 0.12 10*3/uL — ABNORMAL HIGH (ref 0.00–0.07)
BASOS PCT: 0 %
Basophils Absolute: 0 10*3/uL (ref 0.0–0.1)
Eosinophils Absolute: 0.3 10*3/uL (ref 0.0–0.5)
Eosinophils Relative: 3 %
HCT: 30.6 % — ABNORMAL LOW (ref 36.0–46.0)
Hemoglobin: 9.5 g/dL — ABNORMAL LOW (ref 12.0–15.0)
Immature Granulocytes: 1 %
Lymphocytes Relative: 33 %
Lymphs Abs: 3 10*3/uL (ref 0.7–4.0)
MCH: 27.9 pg (ref 26.0–34.0)
MCHC: 31 g/dL (ref 30.0–36.0)
MCV: 89.7 fL (ref 80.0–100.0)
Monocytes Absolute: 1 10*3/uL (ref 0.1–1.0)
Monocytes Relative: 11 %
Neutro Abs: 4.7 10*3/uL (ref 1.7–7.7)
Neutrophils Relative %: 52 %
PLATELETS: 178 10*3/uL (ref 150–400)
RBC: 3.41 MIL/uL — AB (ref 3.87–5.11)
RDW: 15.6 % — ABNORMAL HIGH (ref 11.5–15.5)
WBC: 9.1 10*3/uL (ref 4.0–10.5)
nRBC: 0 % (ref 0.0–0.2)

## 2018-06-23 LAB — GLUCOSE, CAPILLARY
GLUCOSE-CAPILLARY: 282 mg/dL — AB (ref 70–99)
Glucose-Capillary: 214 mg/dL — ABNORMAL HIGH (ref 70–99)
Glucose-Capillary: 264 mg/dL — ABNORMAL HIGH (ref 70–99)
Glucose-Capillary: 283 mg/dL — ABNORMAL HIGH (ref 70–99)

## 2018-06-23 MED ORDER — PRO-STAT SUGAR FREE PO LIQD
30.0000 mL | Freq: Two times a day (BID) | ORAL | Status: DC
  Administered 2018-06-23 – 2018-06-27 (×9): 30 mL via ORAL
  Filled 2018-06-23 (×9): qty 30

## 2018-06-23 MED ORDER — AMOXICILLIN 500 MG PO CAPS
500.0000 mg | ORAL_CAPSULE | Freq: Three times a day (TID) | ORAL | Status: DC
  Administered 2018-06-23 – 2018-06-27 (×12): 500 mg via ORAL
  Filled 2018-06-23 (×15): qty 1

## 2018-06-23 NOTE — Progress Notes (Signed)
Per nursing, patient was given "Data Collection Information Summary for Patients in Inpatient Rehabilitation Facilities with attached Privacy Act Statement Health Care Records" upon admission.    Patient information reviewed and entered into eRehab System by Becky Daci Stubbe, PPS coordinator. Information including medical coding, function ability, and quality indicators will be reviewed and updated through discharge.   

## 2018-06-23 NOTE — Evaluation (Signed)
Occupational Therapy Assessment and Plan  Patient Details  Name: Lori Hardy MRN: [id number] Date of Birth: December 19, 1940  OT Diagnosis: abnormal posture, acute pain, cognitive deficits and muscle weakness (generalized) Rehab Potential:   ELOS: 14-18   Today's Date: 06/23/2018 OT Individual Time: 0900-1000 OT Individual Time Calculation (min): 60 min     Problem List:  Patient Active Problem List   Diagnosis Date Noted  . Hypoalbuminemia due to protein-calorie malnutrition ([place])   . Diabetes mellitus type 2 in nonobese (HCC)   . Metabolic encephalopathy 58/01/9832  . Encephalopathy   . Acute pyelonephritis   . Debility   . Anemia of chronic disease   . CKD (chronic kidney disease), stage II   . Hypertensive crisis   . Sepsis ([place]) 06/19/2018  . AMS (altered mental status) 06/18/2018  . Weight loss 06/28/2017  . Persistent proteinuria 06/28/2017  . Anemia due to stage 3 chronic kidney disease ([place]) 03/23/2017  . Positive ANA (antinuclear antibody) 02/15/2017  . Posterior capsular opacification of right eye, obscuring vision 12/29/2016  . No advance directive on file 03/16/2016  . Need for hepatitis B vaccination 02/03/2016  . Microalbuminuria due to type 2 diabetes mellitus ([place]) 01/20/2016  . Language barrier to communication 12/18/2015  . Night sweats 11/20/2015  . Acquired hypothyroidism 10/02/2015  . Polyneuropathy due to type 2 diabetes mellitus ([place]) 09/26/2015  . Migraine without aura and without status migrainosus, not intractable 09/26/2015  . Diabetes mellitus ([place]) 08/13/2015  . Gastro-esophageal reflux disease without esophagitis 08/13/2015  . Hyperlipidemia 08/13/2015  . Hypertension 08/13/2015  . 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 Unspecified cirrhosis of liver ([place]) 08/13/2015  . Hyperlipidemia associated with type 2 diabetes mellitus ([place]) 06/26/2015  . Primary open-angle glaucoma 04/14/2015    Past Medical History:  Past Medical History:  Diagnosis  Date  . Aortic atherosclerosis ([place]) 12/15/2015   Overview:  By imaging, xray 11/2015.  . Closed fracture of right proximal humerus 04/06/2017  . Compression fracture of L4 lumbar vertebra, closed, initial encounter ([place]) 01/12/2017  . Diabetes mellitus without complication ([place])   . Exposure to TB 09/26/2015  . 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  Differential includes diet, medications, possible pancreatic insufficiency.  Negative C. difficile 09/27/2015  . H. pylori infection 10/07/2016   Overview:  BX confirmed. 09/15/2016- Dr. Ferdinand Lango, [facility]   Marland Kitchen History of compression fracture of spine 11/20/2015   Overview:  T12, on prolia.  Marland Kitchen History of Helicobacter pylori infection 11/20/2015   Overview:  By EGD 2017.  Treated with 2 courses of antibiotics at [facility].  . Hx of pancreatitis 09/26/2015   Overview:  08/2015 [facility]  . Hypertension   . Hyponatremia 04/18/2017  . 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 No active disease on chest xray 11/25/15. Pt from [place], past exposure to TB. Pt agreed to referral and treatment by [facility]- referral sent 12/15/2015 (telephone [phone], fax [phone]).   . Polymyalgia ([place]) 01/12/2017  . 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 Pseudophakia 06/11/2015  . Seasonal allergic rhinitis 09/02/2016  . Senile osteoporosis 06/26/2015   Overview:  Bone density 12/2014 [facility]. T-4.4 and -3.8. On prolia managed by Dr. Posey Pronto.  Normal intact PTH in 2017.  Marland Kitchen Thrombocytopenia ([place]) 09/26/2015  .  Xerosis of skin 04/14/2016   Past Surgical History:  Past Surgical History:  Procedure Laterality Date  . cataracts      Assessment & Plan Clinical Impression: 78 yo female with onset  of L hemiparesis and acute encephalopathy due to sepsis was admitted and noted no source for her weakness.  Did have blood in CSF with lumbar puncture, but infection from UTI is blamed for hemiparesis.  PMHx:  chronic diarrhea, hypothyroidism, GERD, CKD, seizures,  Patient currently requires max with basic self-care skills secondary to muscle weakness, decreased cardiorespiratoy endurance, decreased attention, decreased awareness, decreased problem solving, decreased safety awareness and decreased memory and decreased sitting balance, decreased standing balance, decreased postural control and decreased balance strategies.  Prior to hospitalization, patient could complete BADL/IADL with modified independent .  Patient will benefit from skilled intervention to decrease level of assist with basic self-care skills prior to discharge home with care partner.  Anticipate patient will require 24 hour supervision and minimal physical assistance and follow up home health.  OT - End of Session Activity Tolerance: Tolerates 10 - 20 min activity with multiple rests Endurance Deficit: Yes OT Assessment Rehab Potential (ACUTE ONLY): Fair OT Barriers to Discharge: Home environment access/layout;Decreased caregiver support;Lack of/limited family support OT Patient demonstrates impairments in the following area(s): Balance;Behavior;Cognition;Endurance;Nutrition;Pain;Safety;Vision OT Basic ADL's Functional Problem(s): Grooming;Bathing;Dressing;Toileting OT Transfers Functional Problem(s): Toilet;Tub/Shower OT Additional Impairment(s): None OT Plan OT Intensity: Minimum of 1-2 x/day, 45 to 90 minutes OT Frequency: 5 out of 7 days OT Duration/Estimated Length of Stay: 14-18 OT Treatment/Interventions: Balance/vestibular training;Community reintegration;Disease mangement/prevention;Neuromuscular re-education;Patient/family education;Self Care/advanced ADL retraining;Splinting/orthotics;Therapeutic Exercise;UE/LE  Coordination activities;Cognitive remediation/compensation;Discharge planning;DME/adaptive equipment instruction;Functional mobility training;Pain management;Psychosocial support;Skin care/wound managment;Therapeutic Activities;UE/LE Strength taining/ROM;Visual/perceptual remediation/compensation OT Self Feeding Anticipated Outcome(s): S OT Basic Self-Care Anticipated Outcome(s): S-UB dressing; MIN A LB dressing OT Toileting Anticipated Outcome(s): S toileting, MIN A bathing OT Bathroom Transfers Anticipated Outcome(s): S toileting, MIN A bathing OT Recommendation Patient destination: Home Follow Up Recommendations: Home health OT Equipment Recommended: 3 in 1 bedside comode;Tub/shower seat;To be determined   Skilled Therapeutic Intervention 1:1. Pt received seated in bed with interpreter present, however interpreter does not speak same dialect as pt. Able to communicate with 1-2 word phrases. Pt reports HA and RN aware/already delivered medication. Pt completes supine>sitting with MOD A for trunk elevation and guiding A for sequencing. Pt and OT attempt stand pivot transfer to w/c with MOD A for sit to stand, however during pivot pt attempting to sit down stating "my body hurts." Attempted to engage pt in bathing EOB, however pt saying, "I cant do any more." when given wet washcloth with rag, pt able to wash chest, stomach and underarms. Pt reporting dizziness but wants shirt donned and sitting balance worsending. Interpreter provides total A to don shirt while OT provides min A sitting balance to don shirt and jacket. Vitals assessed 100% O2, HR 88, BP 160/60. RN alerted and OT dons teds per RN order. Exited session with pt reporting dizziness subsiding in supine with call light in reach and exit alarm on. Pt missed 12 min skilled OT d/t pr fatigue/refusal   OT Evaluation Precautions/Restrictions  Precautions Precautions: Fall;Other (comment) Restrictions Weight Bearing Restrictions:  No General Chart Reviewed: Yes Vital Signs Therapy Vitals Temp: 98.4 F (36.9 C) Temp Source: Oral Pulse Rate: 94 Resp: 15 BP: (!) 158/70 Patient Position (if appropriate): Lying Oxygen Therapy SpO2: 100 % O2 Device: Room Air Pain Pain Assessment Faces Pain Scale: Hurts a little bit Pain Location: Head Pain  Intervention(s): Medication (See eMAR)(prior to arrival by RN) Home Living/Prior [place] expects to be discharged to:: Private residence Living Arrangements: Children Available Help at Discharge: Family, Available 24 hours/day Type of Home: House Home Access: Stairs to enter Technical brewer of Steps: 3 Entrance Stairs-Rails: None Home Layout: Multi-level, Able to live on main level with bedroom/bathroom, Full bath on main level, Laundry or work area in basement [employer] Shower/Tub: Multimedia programmer: Standard Bathroom Accessibility: Yes Additional Comments: aide 1030 until 1230 daily to asisst with adls  Lives With: Son Prior Function Comments: uses rollator or SPC usually ADL ADL Upper Body Bathing: Moderate assistance Where Assessed-Upper Body Bathing: Edge of bed Lower Body Bathing: Not assessed Upper Body Dressing: Dependent Where Assessed-Upper Body Dressing: Edge of bed Lower Body Dressing: Dependent Toileting: Not assessed Vision Baseline Vision/History: No visual deficits Patient Visual Report: (difficulty d/t language barrier) Vision Assessment?: No apparent visual deficits Perception  Perception: Within Functional Limits Praxis Praxis: Intact Cognition Overall Cognitive Status: Impaired/Different from baseline Orientation Level: Person Year: (difficulty d/t language barrier) Month: (difficulty d/t language barrier) Day of Week: (difficulty d/t language barrier) Immediate Memory Recall: (difficulty d/t language barrier) Memory Recall: (difficulty d/t language barrier) Safety/Judgment:  Impaired Sensation Sensation Light Touch: Appears Intact(difficulty d/t language barrier) Proprioception: Appears Intact Coordination Gross Motor Movements are Fluid and Coordinated: No Fine Motor Movements are Fluid and Coordinated: No Motor  Motor Motor: Within Functional Limits Mobility  Transfers Sit to Stand: Moderate Assistance - Patient 50-74% Stand to Sit: Moderate Assistance - Patient 50-74%  Trunk/Postural Assessment  Cervical Assessment Cervical Assessment: (head foreward) Thoracic Assessment Thoracic Assessment: (rounded shoudlers) Lumbar Assessment Lumbar Assessment: (posterior pelvic tilt) Postural Control Postural Control: Deficits on evaluation(insufficient)  Balance Balance Balance Assessed: Yes Dynamic Sitting Balance Dynamic Sitting - Level of Assistance: 4: Min assist Static Standing Balance Static Standing - Level of Assistance: 2: Max assist Extremity/Trunk Assessment RUE Assessment RUE Assessment: Exceptions to [facility] General Strength Comments: generalized weakness LUE Assessment LUE Assessment: Exceptions to [facility] General Strength Comments: generalized weakness     Refer to Care Plan for Long Term Goals  Recommendations for other services: None    Discharge Criteria: Patient will be discharged from OT if patient refuses treatment 3 consecutive times without medical reason, if treatment goals not met, if there is a change in medical status, if patient makes no progress towards goals or if patient is discharged from hospital.  The above assessment, treatment plan, treatment alternatives and goals were discussed and mutually agreed upon: by patient  Tonny Branch 06/23/2018, 9:58 AM

## 2018-06-23 NOTE — Plan of Care (Signed)
  Problem: Consults Goal: RH GENERAL PATIENT EDUCATION Description See Patient Education module for education specifics. Outcome: Progressing   Problem: RH BLADDER ELIMINATION Goal: RH STG MANAGE BLADDER WITH ASSISTANCE Description STG Manage Bladder With Mod Assistance  Outcome: Progressing   Problem: RH SKIN INTEGRITY Goal: RH STG SKIN FREE OF INFECTION/BREAKDOWN Description No new breakdown with min assist   Outcome: Progressing   Problem: RH SAFETY Goal: RH STG ADHERE TO SAFETY PRECAUTIONS W/ASSISTANCE/DEVICE Description STG Adhere to Safety Precautions With Min Assistance/Device.  Outcome: Progressing   

## 2018-06-23 NOTE — IPOC Note (Signed)
Overall Plan of Care [facility]) Patient Details Name: Lori Hardy MRN: [id number] DOB: Apr 24, 1941  Admitting Diagnosis: Metabolic encephalopathy  Hospital Problems: Active Problems:   Metabolic encephalopathy   Debility   Hypoalbuminemia due to protein-calorie malnutrition (HCC)   Diabetes mellitus type 2 in nonobese [facility])     Functional Problem List: Nursing Bladder, Bowel, Medication Management, Endurance, Pain, Motor, Nutrition, Safety  PT Balance, Behavior, Endurance, Motor, Safety, Perception, Pain  OT Balance, Behavior, Cognition, Endurance, Nutrition, Pain, Safety, Vision  SLP    TR         Basic ADL's: OT Grooming, Bathing, Dressing, Toileting     Advanced  ADL's: OT       Transfers: PT Bed Mobility, Bed to Chair, Car, [employer], Civil Service fast streamer, Research scientist (life sciences): PT Ambulation, Psychologist, prison and probation services, Stairs     Additional Impairments: OT None  SLP        TR      Anticipated Outcomes Item Anticipated Outcome  Self Feeding S  Swallowing      Basic self-care  S-UB dressing; MIN A LB dressing  Toileting  S toileting, MIN A bathing   Bathroom Transfers S toileting, MIN A bathing  Bowel/Bladder  Pt will manage bowel and bladder with min assist   Transfers  supervision assist with LRAD   Locomotion  Supervision assist with LRAD for household   Communication     Cognition     Pain  Pt will manage pain at 3 or less on a scale of 0-10.   Safety/Judgment  Pt will follow safety plan with min cues/assist    Therapy Plan: PT Intensity: Minimum of 1-2 x/day ,45 to 90 minutes PT Frequency: 5 out of 7 days PT Duration Estimated Length of Stay: 14-16days  OT Intensity: Minimum of 1-2 x/day, 45 to 90 minutes OT Frequency: 5 out of 7 days OT Duration/Estimated Length of Stay: 14-18      Team Interventions: Nursing Interventions Patient/Family Education, Bladder Management, Bowel Management, Disease Management/Prevention, Medication  Management, Pain Management, Discharge Planning  PT interventions Ambulation/gait training, Balance/vestibular training, Cognitive remediation/compensation, Community reintegration, Disease management/prevention, Discharge planning, DME/adaptive equipment instruction, Functional mobility training, Neuromuscular re-education, Pain management, Patient/family education, Psychosocial support, Skin care/wound management, Splinting/orthotics, Therapeutic Activities, Therapeutic Exercise, UE/LE Strength taining/ROM, UE/LE Coordination activities, Stair training, Wheelchair propulsion/positioning, Visual/perceptual remediation/compensation  OT Interventions Warden/ranger, Community reintegration, Disease mangement/prevention, Neuromuscular re-education, Patient/family education, Self Care/advanced ADL retraining, Splinting/orthotics, Therapeutic Exercise, UE/LE Coordination activities, Cognitive remediation/compensation, Discharge planning, DME/adaptive equipment instruction, Functional mobility training, Pain management, Psychosocial support, Skin care/wound managment, Therapeutic Activities, UE/LE Strength taining/ROM, Visual/perceptual remediation/compensation  SLP Interventions    TR Interventions    SW/CM Interventions Discharge Planning, Psychosocial Support, Patient/Family Education   Barriers to Discharge MD  Medical stability  Nursing      PT Inaccessible home environment, Decreased caregiver support, Medical stability, Lack of/limited family support    OT Home environment access/layout, Decreased caregiver support, Lack of/limited family support    SLP      SW       Team Discharge Planning: Destination: PT-Home ,OT- Home , SLP-  Projected Follow-up: PT-Home health PT, OT-  Home health OT, SLP-None Projected Equipment Needs: PT-Wheelchair (measurements), Wheelchair cushion (measurements), Rolling walker with 5" wheels, OT- 3 in 1 bedside comode, Tub/shower seat, To be  determined, SLP-None recommended by SLP Equipment Details: PT- , OT-  Patient/family involved in discharge planning: PT- Patient,  OT-Patient, SLP-   MD ELOS: 12-16 days.  Medical Rehab Prognosis:  Good Assessment: 78 year old right handed limited English-speaking female with history of type 2 diabetes mellitus, hyperlipidemia, anemia chronic disease,CKD stage II. She has a home health aide 2 hours a day. Daughter-in-law is available. Presented 06/18/2018 with complaints of headache, altered mental status, vague left side weakness and vomiting. Noted fever 104, lactic acid 3.5,urine study greater 100,000 enterococcus faecalis. Cranial CT scan negative for acute changes. Noted small remote lacunar infarction posteriorly in the right lentiform nucleus and left caudate head. MRI negative for acute changes. CT angiogram of head and neck with no dissection or aneurysm. Lumbar puncture completed glucose elevated 200, protein 55, Debbie BC of 8. Ultrasound the abdomen negative. Blood cultures no growth to date. Currently maintained on antibiotics for UTI. EEG negative for seizure. Maintained on Keppra for seizure prophylaxis. Neurology follow-up suspect septic metabolic encephalopathy. Hospital course complicated by hypertensive crisis and monitored.  Patient with resulting functional deficits with self-care, cognition, transfers.  We will set goals for Supervision/Min A with PT/OT/SLP.    See Team Conference Notes for weekly updates to the plan of care

## 2018-06-23 NOTE — Progress Notes (Signed)
Inpatient Diabetes Program Recommendations  AACE/ADA: New Consensus Statement on Inpatient Glycemic Control (2015)  Target Ranges:  Prepandial:   less than 140 mg/dL      Peak postprandial:   less than 180 mg/dL (1-2 hours)      Critically ill patients:  140 - 180 mg/dL   Lab Results  Component Value Date   GLUCAP 264 (H) 06/23/2018   HGBA1C 9.7 (H) 06/20/2018    Review of Glycemic Control Results for Lori Hardy, Lori Hardy (MRN [id number]) as of 06/23/2018 12:41  Ref. Range 06/22/2018 10:55 06/22/2018 16:51 06/22/2018 21:41 06/23/2018 06:41 06/23/2018 11:48  Glucose-Capillary Latest Ref Range: 70 - 99 mg/dL 606 (H) 770 (H) 340 (H) 214 (H) 264 (H)  Diabetes history:DM2 Outpatient Diabetes medications: Basaglar 14 units QHS, Glipizide 10 mg QAM, Actos 45 mg daily Current orders: Novolog sensitive tid with meals and HS, Lantus 14 units daily Inpatient Diabetes Program Recommendations:   Please consider adding Novolog meal coverage 3 units tid with meals.  Also consider increasing Lantus to 18 units daily.   Thanks,  Beryl Meager, RN, BC-ADM Inpatient Diabetes Coordinator Pager [phone] (8a-5p)

## 2018-06-23 NOTE — Progress Notes (Signed)
[facility] PHYSICAL MEDICINE & REHABILITATION PROGRESS NOTE  Subjective/Complaints: Patient seen sitting up in bed this morning.  She states she did not sleep well overnight because she was adjusting to her new room.  She has questions regarding why she has not received her morning medications yet.  ROS: Denies CP, shortness of breath, nausea, vomiting, diarrhea.  Objective: Vital Signs: Blood pressure (!) 158/70, pulse 94, temperature 98.4 F (36.9 C), temperature source Oral, resp. rate 15, height 5\' 4"  (1.626 m), weight 70.6 kg, SpO2 100 %. No results found. Recent Labs    06/22/18 1924 06/23/18 0506  WBC 9.4 9.1  HGB 10.3* 9.5*  HCT 32.6* 30.6*  PLT 177 178   Recent Labs    06/22/18 0944 06/22/18 1924 06/23/18 0506  NA 134*  --  136  K 3.2*  --  4.1  CL 104  --  104  CO2 22  --  22  GLUCOSE 242*  --  242*  BUN 8  --  8  CREATININE 0.98 0.98 0.91  CALCIUM 8.3*  --  8.3*    Physical Exam: BP (!) 158/70 (BP Location: Right Arm)   Pulse 94   Temp 98.4 F (36.9 C) (Oral)   Resp 15   Ht 5\' 4"  (1.626 m)   Wt 70.6 kg   SpO2 100%   BMI 26.72 kg/m  Constitutional: No distress . Vital signs reviewed. HENT: Normocephalic.  Atraumatic. Eyes: EOMI. No discharge. Cardiovascular: RRR. No JVD. Respiratory: CTA Bilaterally. Normal effort. GI: BS +. Non-distended. Musc: No edema or tenderness in extremities. Neuro: Alert Motor: Grossly appears to be 4+/5 throughout Skin: Warm and dry.  Intact. Psych: Slowed, confused, tangential  Assessment/Plan: 1. Functional deficits secondary to metabolic encephalopathy which require 3+ hours per day of interdisciplinary therapy in a comprehensive inpatient rehab setting.  Physiatrist is providing close team supervision and 24 hour management of active medical problems listed below.  Physiatrist and rehab team continue to assess barriers to discharge/monitor patient progress toward functional and medical goals  Care  Tool:  Bathing              Bathing assist       Upper Body Dressing/Undressing Upper body dressing        Upper body assist      Lower Body Dressing/Undressing Lower body dressing            Lower body assist       Toileting Toileting    Toileting assist Assist for toileting: Maximal Assistance - Patient 25 - 49%     Transfers Chair/bed transfer  Transfers assist  Chair/bed transfer activity did not occur: Safety/medical concerns        Locomotion Ambulation   Ambulation assist              Walk 10 feet activity   Assist           Walk 50 feet activity   Assist           Walk 150 feet activity   Assist           Walk 10 feet on uneven surface  activity   Assist           Wheelchair     Assist               Wheelchair 50 feet with 2 turns activity    Assist            Wheelchair  150 feet activity     Assist            Medical Problem List and Plan: 1.  Decreased functional mobility with debilitation secondary to septic metabolic encephalopathy/urosepsis.   Blood cultures negative.  CSF cultures NGTD  Begin CIR  Notes reviewed, labs reviewed 2.  DVT Prophylaxis/Anticoagulation: subcutaneous Lovenox. Monitor for any bleeding episodes monitor platelets 3. Pain Management:  Tylenol as needed, avoid narcotic analgesics given age and fall risk 4. Mood:  Provide emotional support 5. Neuropsych: This patient is  not capable of making decisions on her own behalf. 6. Skin/Wound Care:  Routine skin checks 7. Fluids/Electrolytes/Nutrition:  Routine in and out's   BMP within acceptable range on 2/14 8. Hypertension. Norvasc 5 mg daily,Cozaar 50 mg daily, Lopressor 25 mg twice a day.   Monitor with increased mobility 9. Seizure prophylaxis. Keppra 500 mg twice a day. EEG negative 10. Diabetes mellitus. Hemoglobin A1c 9.7.  Lantus insulin 14 units daily. Check blood sugars before meals and  at bedtime  Monitor with increased mobility 11. Urosepsis/enterococcus faecalis.   Continue amoxicillin through 2/18 12. Hypothyroidism. Synthroid 13.  Hypokalemia supplement potassium 14.  Hypoalbuminemia  Supplement initiated on 2/14 15.  Anemia of chronic disease  Hemoglobin 9.5 on 2/14  Labs ordered for Monday  LOS: 1 days A FACE TO FACE EVALUATION WAS PERFORMED  Malkie Wille Sydel Wilensky 06/23/2018, 8:56 AM

## 2018-06-23 NOTE — Evaluation (Signed)
Speech Language Pathology Assessment and Plan  Patient Details  Name: Lori Hardy MRN: [id number] Date of Birth: 31-Jul-1940  Evaluation Only  Today's Date: 06/23/2018 SLP Individual Time: 0800-0900 SLP Individual Time Calculation (min): 60 min   Problem List:  Patient Active Problem List   Diagnosis Date Noted  . Hypoalbuminemia due to protein-calorie malnutrition ([place])   . Diabetes mellitus type 2 in nonobese ([facility])   . Metabolic encephalopathy 92/03/9416  . Encephalopathy   . Acute pyelonephritis   . Debility   . Anemia of chronic disease   . CKD (chronic kidney disease), stage II   . Hypertensive crisis   . Sepsis ([place]) 06/19/2018  . AMS (altered mental status) 06/18/2018  . Weight loss 06/28/2017  . Persistent proteinuria 06/28/2017  . Anemia due to stage 3 chronic kidney disease ([place]) 03/23/2017  . Positive ANA (antinuclear antibody) 02/15/2017  . Posterior capsular opacification of right eye, obscuring vision 12/29/2016  . No advance directive on file 03/16/2016  . Need for hepatitis B vaccination 02/03/2016  . Microalbuminuria due to type 2 diabetes mellitus ([place]) 01/20/2016  . Language barrier to communication 12/18/2015  . Night sweats 11/20/2015  . Acquired hypothyroidism 10/02/2015  . Polyneuropathy due to type 2 diabetes mellitus ([place]) 09/26/2015  . Migraine without aura and without status migrainosus, not intractable 09/26/2015  . Diabetes mellitus ([place]) 08/13/2015  . Gastro-esophageal reflux disease without esophagitis 08/13/2015  . Hyperlipidemia 08/13/2015  . Hypertension 08/13/2015  . 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 Unspecified cirrhosis of liver ([place]) 08/13/2015  . Hyperlipidemia associated with type 2 diabetes mellitus ([place]) 06/26/2015  . Primary open-angle glaucoma 04/14/2015   Past Medical History:  Past Medical History:  Diagnosis Date  . Aortic atherosclerosis ([place]) 12/15/2015   Overview:  By imaging, xray 11/2015.  . Closed fracture of  right proximal humerus 04/06/2017  . Compression fracture of L4 lumbar vertebra, closed, initial encounter ([place]) 01/12/2017  . Diabetes mellitus without complication ([place])   . Exposure to TB 09/26/2015  . 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  Differential includes diet, medications, possible pancreatic insufficiency.  Negative C. difficile 09/27/2015  . H. pylori infection 10/07/2016   Overview:  BX confirmed. 09/15/2016- Dr. Ferdinand Lango, [facility]   Marland Kitchen History of compression fracture of spine 11/20/2015   Overview:  T12, on prolia.  Marland Kitchen History of Helicobacter pylori infection 11/20/2015   Overview:  By EGD 2017.  Treated with 2 courses of antibiotics at [facility].  . Hx of pancreatitis 09/26/2015   Overview:  08/2015 [facility]  . Hypertension   . Hyponatremia 04/18/2017  . 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 No active disease on chest xray 11/25/15. Pt from [place], past exposure to TB. Pt agreed to referral and treatment by [facility]- referral sent 12/15/2015 (telephone [phone], fax [phone]).   . 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 Pseudophakia 06/11/2015  . Seasonal allergic rhinitis 09/02/2016  . Senile osteoporosis 06/26/2015   Overview:  Bone density 12/2014 [facility]. T-4.4 and -3.8. On prolia managed by Dr. Posey Pronto.  Normal intact PTH in 2017.  Marland Kitchen Thrombocytopenia ([place]) 09/26/2015  . Xerosis of skin 04/14/2016   Past Surgical History:  Past Surgical History:  Procedure Laterality Date  .  cataracts      Assessment / Plan / Recommendation Clinical Impression Lori Hardy is a 78-year-old right handed limited English-speaking female with history of type 2 diabetes  mellitus, hyperlipidemia, anemia chronic disease, CKD stage II. Per chart review patient lives with son and family. Used a walker prior to admission. She has a home health aide 2 hours a day. Daughter-in-law is available. Presented  06/18/2018 with complaints of headache, altered mental status, vague left side weakness and vomiting. Noted fever 104, lactic acid 3.5, urine study greater 100,000 enterococcus faecalis. Cranial CT scan negative for acute changes. Noted small remote lacunar infarction posteriorly in the right lentiform nucleus and left caudate head. MRI negative for acute changes. Neurology follow-up suspect septic metabolic encephalopathy. Therapy evaluations completed with recommendations of physical medicine rehabilitation consult. Patient was admitted for a comprehensive rehabilitation program on 06/22/18.    Bedside swallow evaluation and cognitive linguistic evaluations completed on 06/23/18. Hindi interpreter present. Although both the interpreter and pt are from [place], pt speaks a different language than that of the interpreter. Despite this, pt was able to communicate functionally with interpreter. Pt's speech was intelligible, she was oriented to location, season (concept of month difficult to translate), able to communicate correct biographical information. Pt states that she has never attended any form of school or worked outside of home. Pt with appropriate attention and problem solving during bed mobility tasks. Pt able to communicate wants and needs (medicine/need for bathroom), follow directions and she consumed trials of regular with thin liquids without overt s/s of dysphagia or aspiration. Pt appears to be making great progress with cognition as well as safe consumption of POs. As a result, ST is not indicated.    Skilled Therapeutic Interventions          Skilled treatment session focused on completion of cognitive linguistic and bedside swallow evaluation, see above. Pt with  awareness of need to have BM and able to express semi-complex thought process with care and location of her briefs. Pt consumed regular diet textures and thin liquids without s/s of aspiration or dysphagia. Education completed with pt's nurse.    SLP Assessment  Patient does not need any further Speech Lanaguage Pathology Services    Recommendations  SLP Diet Recommendations: Age appropriate regular solids;Thin Liquid Administration via: Cup;Straw Medication Administration: Whole meds with puree Supervision: Patient able to self feed Compensations: Minimize environmental distractions;Slow rate;Small sips/bites Postural Changes and/or Swallow Maneuvers: Seated upright 90 degrees Oral Care Recommendations: Oral care BID Follow up Recommendations: None Equipment Recommended: None recommended by SLP           Pain Pain Assessment Pain Scale: 0-10 Pain Score: 0-No pain Faces Pain Scale: Hurts a little bit Pain Location: Head Pain Intervention(s): Medication (See eMAR)(prior to arrival by RN)  Prior Functioning Type of Home: House  Lives With: Son Available Help at Discharge: Family;Available 24 hours/day Education: never attended school Vocation: (has never worked outside of home)  Short Term Goals: No short term goals set  Refer to Care Plan for Long Term Goals  Recommendations for other services: None   Discharge Criteria: Patient will be discharged from SLP if patient refuses treatment 3 consecutive times without medical reason, if treatment goals not met, if there is a change in medical status, if patient makes no progress towards goals or if patient is discharged from hospital.  The above assessment, treatment plan, treatment alternatives and goals were discussed and mutually agreed upon: by patient    06/23/2018,   11:01 AM  

## 2018-06-23 NOTE — H&P (Signed)
Copied from old chart:     Physical Medicine and Rehabilitation Admission H&P    : HPI: Lori Hardy is a 78 year old right handed limited English-speaking female with history of type 2 diabetes mellitus, hyperlipidemia, anemia chronic disease,CKD stage II. Per chart review patient lives with son and family. Used a walker prior to admission. She has a home health aide 2 hours a day. Daughter-in-law is available. Presented  06/18/2018 with complaints of headache, altered mental status, vague left side weakness and vomiting. Noted fever 104, lactic acid 3.5,urine study greater 100,000 enterococcus faecalis. Cranial CT scan negative for acute changes. Noted small remote lacunar infarction posteriorly in the right lentiform nucleus and left caudate head. MRI negative for acute changes. CT angiogram of head and neck with no dissection or aneurysm. Lumbar puncture completed glucose elevated 200, protein 55, Debbie BC of 8. Ultrasound the abdomen negative. Blood cultures no growth to date. Currently maintained on Rocephin for UTI 7 days. EEG negative for seizure. Maintained on Keppra for seizure prophylaxis. Neurology follow-up suspect septic metabolic encephalopathy. Hospital course complicated by hypertensive crisis and monitored. Subcutaneous Lovenox for DVT prophylaxis. Therapy evaluations completed with recommendations of physical medicine rehabilitation consult. Patient was admitted for a comprehensive rehabilitation program.  Review of Systems  Constitutional: Positive for fever.  HENT: Negative for hearing loss.   Eyes: Negative for blurred vision and double vision.  Respiratory: Negative for cough and shortness of breath.   Cardiovascular: Positive for leg swelling. Negative for chest pain and palpitations.  Gastrointestinal: Positive for constipation. Negative for nausea.  Genitourinary: Positive for urgency. Negative for dysuria, flank pain and hematuria.  Musculoskeletal: Positive for  myalgias.  Skin: Negative for rash.  Neurological: Positive for weakness and headaches.  All other systems reviewed and are negative.      Past Medical History:  Diagnosis Date  . Aortic atherosclerosis (HCC) 12/15/2015   Overview:  By imaging, xray 11/2015.  . Closed fracture of right proximal humerus 04/06/2017  . Compression fracture of L4 lumbar vertebra, closed, initial encounter (HCC) 01/12/2017  . Diabetes mellitus without complication (HCC)   . Exposure to TB 09/26/2015  . 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  Differential includes diet, medications, possible pancreatic insufficiency.  Negative C. difficile 09/27/2015  . H. pylori infection 10/07/2016   Overview:  BX confirmed. 09/15/2016- [facility].   Marland Kitchen. History of compression fracture of spine 11/20/2015   Overview:  T12, on prolia.  Marland Kitchen. History of Helicobacter pylori infection 11/20/2015   Overview:  By EGD 2017.  [facility]  . Hx of pancreatitis 09/26/2015   Overview:  08/2015 [facility]  . Hypertension   . Hyponatremia 04/18/2017  . 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 No active disease on chest xray 11/25/15. Pt from [place], past exposure to TB. Pt agreed to referral and treatment by [facility]- referral sent 12/15/2015 (telephone [phone], fax #[phone]).   . 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 Pseudophakia 06/11/2015  . Seasonal allergic rhinitis 09/02/2016  . Senile osteoporosis 06/26/2015   Overview:  Bone density 12/2014 [facility]. T-4.4 and -3.8. On prolia managed by Dr. Allena KatzPatel.  Normal intact PTH in 2017.  .Marland Kitchen  Thrombocytopenia (HCC) 09/26/2015  .  Xerosis of skin 04/14/2016        Past Surgical History:  Procedure Laterality Date  . cataracts     History reviewed. No pertinent family history. Social History:  reports that she has never smoked. She has never used smokeless tobacco. No history on file for alcohol and drug. Allergies:       Allergies  Allergen Reactions  . Brimonidine Itching    Redness and itching eye Redness and itching Redness and itching          Medications Prior to Admission  Medication Sig Dispense Refill  . amLODipine (NORVASC) 5 MG tablet Take 5 mg by mouth daily.     . Cholecalciferol (VITAMIN D3) 25 MCG (1000 UT) CAPS Take 1,000 mg by mouth daily.    . diphenoxylate-atropine (LOMOTIL) 2.5-0.025 MG tablet Take 1 tablet by mouth 4 (four) times daily as needed.    . dorzolamide (TRUSOPT) 2 % ophthalmic solution Place 1 drop into the right eye 2 (two) times daily.    Marland Kitchen esomeprazole (NEXIUM) 40 MG capsule Take 40 mg daily at 12 noon by mouth.    Marland Kitchen glipiZIDE (GLUCOTROL) 10 MG tablet Take 10 mg daily before breakfast by mouth.    . Insulin Glargine (BASAGLAR KWIKPEN) 100 UNIT/ML SOPN Inject 14 Units into the skin at bedtime.    Marland Kitchen latanoprost (XALATAN) 0.005 % ophthalmic solution Place 1 drop into both eyes at bedtime.    Marland Kitchen levothyroxine (SYNTHROID, LEVOTHROID) 25 MCG tablet Take 25 mcg daily before breakfast by mouth.    . loratadine (CLARITIN) 10 MG tablet Take 10 mg by mouth daily.    Marland Kitchen losartan (COZAAR) 100 MG tablet Take 100 mg daily by mouth.    . Melatonin 1 MG TABS Take 1 mg by mouth at bedtime.    . metoprolol tartrate (LOPRESSOR) 25 MG tablet Take 25 mg by mouth 2 (two) times daily.    . ondansetron (ZOFRAN) 8 MG tablet Take every 8 (eight) hours as needed by mouth for nausea or vomiting.    . pioglitazone (ACTOS) 45 MG tablet Take 45 mg daily by mouth.    . rosuvastatin (CRESTOR) 10 MG tablet Take 10 mg by mouth at bedtime.     . timolol (TIMOPTIC) 0.5 %  ophthalmic solution Place 1 drop into the right eye daily.    . traMADol (ULTRAM) 50 MG tablet Take 50 mg by mouth every 6 (six) hours as needed for pain.      Drug Regimen Review Drug regimen was reviewed and remains appropriate with no significant issues identified  Home: Home Living Family/patient expects to be discharged to:: Inpatient rehab Additional Comments: Pt's daughter in law expresses concern as family is not available 24/7 at home.   Functional History: Prior Function Level of Independence: Independent with assistive device(s) Comments: uses rollator or SPC usually  Functional Status:  Mobility: Bed Mobility Overal bed mobility: Needs Assistance Bed Mobility: Supine to Sit, Sit to Supine Supine to sit: Mod assist Sit to supine: Mod assist General bed mobility comments: mod to lift trunk and finish scooting to EOB, mod to lift legs and trunk back.  2 person assist to scoot up in bed Transfers Overall transfer level: Needs assistance Equipment used: Rolling walker (2 wheeled) Transfers: Sit to/from Stand Sit to Stand: Mod assist Stand pivot transfers: Max assist, From elevated surface(max due to gait fatigue) General transfer comment: cued sitting balance and control of push to stand Ambulation/Gait  Ambulation/Gait assistance: +2 physical assistance, +2 safety/equipment, Mod assist Gait Distance (Feet): 12 Feet Assistive device: Rolling walker (2 wheeled), 2 person hand held assist Gait Pattern/deviations: Step-to pattern, Step-through pattern, Decreased stride length, Wide base of support, Trunk flexed, Decreased weight shift to left General Gait Details: pt is weak, requires dense cues for safety with both sitting and standing  Gait velocity: reduced Gait velocity interpretation: <1.8 ft/sec, indicate of risk for recurrent falls  ADL: ADL Overall ADL's : Needs assistance/impaired Eating/Feeding: Set up, Bed level Grooming: Minimal assistance, Bed  level Upper Body Bathing: Moderate assistance Lower Body Bathing: Maximal assistance Upper Body Dressing : Moderate assistance Lower Body Dressing: Maximal assistance Toilet Transfer: (DNT) Toilet Transfer Details (indicate cue type and reason): unable to perform today due to nausea and plopping back onto bed with lateral leans and posterior lean. Functional mobility during ADLs: Maximal assistance, Rolling walker(maxA for power up and for stabilizing) General ADL Comments: Increased time and effort required; unable to perform without mod to maxA  Cognition: Cognition Overall Cognitive Status: Impaired/Different from baseline Orientation Level: Oriented to person, Disoriented to place, Disoriented to time, Disoriented to situation Cognition Arousal/Alertness: Lethargic Behavior During Therapy: Flat affect Overall Cognitive Status: Impaired/Different from baseline Area of Impairment: Following commands, Safety/judgement, Awareness, Problem solving Following Commands: Follows one step commands with increased time Safety/Judgement: Decreased awareness of safety, Decreased awareness of deficits General Comments: Daughter in law cannot translate well  Physical Exam: Blood pressure 114/85, pulse 95, temperature 98.3 F (36.8 C), temperature source Axillary, resp. rate (!) 22, height 5\' 4"  (1.626 m), weight 66.2 kg, SpO2 100 %. Physical Exam  HENT:  Head: Normocephalic.  Neck: Normal range of motion. Neck supple. No thyromegaly present.  Cardiovascular:  Cardiac rate controlled  Respiratory: Effort normal and breath sounds normal. No respiratory distress.  Neurological:  Patient is alert. Limited English speaking. She does follow simple demonstrated commands.  Skin: Skin is warm and dry.    General: No acute distress Mood and affect are appropriate Heart: Regular rate and rhythm no rubs murmurs or extra sounds Lungs: Clear to auscultation, breathing unlabored, no rales or  wheezes Abdomen: Positive bowel sounds, soft nontender to palpation, nondistended Extremities: No clubbing, cyanosis, or edema Skin: No evidence of breakdown, no evidence of rash Neurologic:  motor strength is 5/5 in bilateral deltoid, bicep, tricep, grip, hip flexor, knee extensors, ankle dorsiflexor and plantar flexor Sensory exam normal sensation to light touch and proprioception in bilateral upper and lower extremities Cerebellar exam normal finger to nose to finger as well as heel to shin in bilateral upper and lower extremities Musculoskeletal: Full range of motion in all 4 extremities. No joint swelling LabResultsLast48Hours        Results for orders placed or performed during the hospital encounter of 06/18/18 (from the past 48 hour(s))  Glucose, capillary     Status: Abnormal   Collection Time: 06/20/18 11:13 AM  Result Value Ref Range   Glucose-Capillary 181 (H) 70 - 99 mg/dL  Glucose, capillary     Status: Abnormal   Collection Time: 06/20/18 12:13 PM  Result Value Ref Range   Glucose-Capillary 209 (H) 70 - 99 mg/dL  Glucose, capillary     Status: Abnormal   Collection Time: 06/20/18  4:56 PM  Result Value Ref Range   Glucose-Capillary 193 (H) 70 - 99 mg/dL  Glucose, capillary     Status: Abnormal   Collection Time: 06/20/18  9:05 PM  Result Value Ref Range  Glucose-Capillary 190 (H) 70 - 99 mg/dL  Glucose, capillary     Status: Abnormal   Collection Time: 06/20/18  9:24 PM  Result Value Ref Range   Glucose-Capillary 187 (H) 70 - 99 mg/dL  Basic metabolic panel     Status: Abnormal   Collection Time: 06/21/18  3:25 AM  Result Value Ref Range   Sodium 134 (L) 135 - 145 mmol/L   Potassium 3.6 3.5 - 5.1 mmol/L   Chloride 106 98 - 111 mmol/L   CO2 17 (L) 22 - 32 mmol/L   Glucose, Bld 326 (H) 70 - 99 mg/dL   BUN 14 8 - 23 mg/dL   Creatinine, Ser 3.95 0.44 - 1.00 mg/dL   Calcium 8.1 (L) 8.9 - 10.3 mg/dL   GFR calc non Af Amer 55 (L) >60 mL/min     GFR calc Af Amer >60 >60 mL/min   Anion gap 11 5 - 15    Comment: Performed at [facility], [address] [place], [place] [address]  CBC     Status: Abnormal   Collection Time: 06/21/18  3:25 AM  Result Value Ref Range   WBC 6.8 4.0 - 10.5 K/uL   RBC 3.19 (L) 3.87 - 5.11 MIL/uL   Hemoglobin 9.2 (L) 12.0 - 15.0 g/dL   HCT 34.3 (L) 56.8 - 61.6 %   MCV 90.6 80.0 - 100.0 fL   MCH 28.8 26.0 - 34.0 pg   MCHC 31.8 30.0 - 36.0 g/dL   RDW 83.7 (H) 29.0 - 21.1 %   Platelets 153 150 - 400 K/uL   nRBC 0.0 0.0 - 0.2 %    Comment: Performed at [facility], [address], [place], [place] [address]  Glucose, capillary     Status: Abnormal   Collection Time: 06/21/18  6:24 AM  Result Value Ref Range   Glucose-Capillary 347 (H) 70 - 99 mg/dL  Glucose, capillary     Status: Abnormal   Collection Time: 06/21/18 11:20 AM  Result Value Ref Range   Glucose-Capillary 272 (H) 70 - 99 mg/dL   Comment 1 Notify RN   Glucose, capillary     Status: Abnormal   Collection Time: 06/21/18  4:21 PM  Result Value Ref Range   Glucose-Capillary 215 (H) 70 - 99 mg/dL   Comment 1 Notify RN    Comment 2 Call MD NNP PA CNM   Glucose, capillary     Status: Abnormal   Collection Time: 06/21/18  8:06 PM  Result Value Ref Range   Glucose-Capillary 247 (H) 70 - 99 mg/dL  Glucose, capillary     Status: Abnormal   Collection Time: 06/22/18  6:30 AM  Result Value Ref Range   Glucose-Capillary 190 (H) 70 - 99 mg/dL  Glucose, capillary     Status: Abnormal   Collection Time: 06/22/18  7:52 AM  Result Value Ref Range   Glucose-Capillary 197 (H) 70 - 99 mg/dL   Comment 1 Notify RN    Comment 2 Document in Chart   Basic metabolic panel     Status: Abnormal   Collection Time: 06/22/18  9:44 AM  Result Value Ref Range   Sodium 134 (L) 135 - 145 mmol/L   Potassium 3.2 (L) 3.5 - 5.1 mmol/L   Chloride 104 98 - 111 mmol/L   CO2 22 22 - 32 mmol/L   Glucose,  Bld 242 (H) 70 - 99 mg/dL   BUN 8 8 - 23 mg/dL  Creatinine, Ser 0.98 0.44 - 1.00 mg/dL   Calcium 8.3 (L) 8.9 - 10.3 mg/dL   GFR calc non Af Amer 55 (L) >60 mL/min   GFR calc Af Amer >60 >60 mL/min   Anion gap 8 5 - 15    Comment: Performed at [facility], [address], [place], [place] [address]  CBC     Status: Abnormal   Collection Time: 06/22/18  9:44 AM  Result Value Ref Range   WBC 8.1 4.0 - 10.5 K/uL   RBC 3.59 (L) 3.87 - 5.11 MIL/uL   Hemoglobin 10.4 (L) 12.0 - 15.0 g/dL   HCT 60.4 (L) 54.0 - 98.1 %   MCV 89.4 80.0 - 100.0 fL   MCH 29.0 26.0 - 34.0 pg   MCHC 32.4 30.0 - 36.0 g/dL   RDW 19.1 47.8 - 29.5 %   Platelets 179 150 - 400 K/uL   nRBC 0.0 0.0 - 0.2 %    Comment: Performed at [facility], [address] [place], [place] [address]     ImagingResults(Last48hours)  No results found.       Medical Problem List and Plan: 1.  Decreased functional mobility with debilitation secondary to septic metabolic encephalopathy/urosepsis. Blood cultures and CFS cultures negative to date Initiate rehabilitation evaluations PT OT in a.m. 2.  DVT Prophylaxis/Anticoagulation: subcutaneous Lovenox. Monitor for any bleeding episodes monitor platelets 3. Pain Management:  Tylenol as needed, avoid narcotic analgesics given age and fall risk 4. Mood:  Provide emotional support 5. Neuropsych: This patient is capable of making decisions on her own behalf. 6. Skin/Wound Care:  Routine skin checks 7. Fluids/Electrolytes/Nutrition:  Routine in and out's with follow-up chemistries 8. Hypertension. Norvasc 5 mg daily,Cozaar 50 mg daily, Lopressor 25 mg twice a day. Monitor with increased mobility Monitor for potential hypotension and orthostatic changes 9. Seizure prophylaxis. Keppra 500 mg twice a day. EEG negative 10. Diabetes mellitus. Hemoglobin A1c 9.7.  Lantus insulin 14 units daily. Check blood sugars before meals and at bedtime 11.  Urosepsis/enterococcus faecalis. Plan to continue Rocephin through 06/24/2018 and stop 12. Hypothyroidism. Synthroid 13.  Hypokalemia supplement potassium Post Admission Physician Evaluation: 1. Functional deficits secondary  to  debilitation secondary to septic metabolic encephalopathy/urosepsis. 2. Patient admitted to receive collaborative, interdisciplinary care between the physiatrist, rehab nursing staff, and therapy team. 3. Patient's level of medical complexity and substantial therapy needs in context of that medical necessity cannot be provided at a lesser intensity of care. 4. Patient has experienced substantial functional loss from his/her baseline. Judging by the patient's diagnosis, physical exam, and functional history, the patient has potential for functional progress which will result in measurable gains while on inpatient rehab.  These gains will be of substantial and practical use upon discharge in facilitating mobility and self-care at the household level. 5. Physiatrist will provide 24 hour management of medical needs as well as oversight of the therapy plan/treatment and provide guidance as appropriate regarding the interaction of the two. 6. 24 hour rehab nursing will assist in the management of  bladder management, bowel management, safety, skin/wound care, disease management, medication administration, pain management and patient education  and help integrate therapy concepts, techniques,education, etc. 7. PT will assess and treat for: DME instruction, Gait training, Functional mobility training, Therapeutic activities, Therapeutic exercise, Balance training, Neuromuscular re-education, Cognitive remediation, Patient/family education.  Goals are: supervision. 8. OT will assess and treat for Self-care/ADL training, Neuromuscular education, Energy conservation, Balance training, DME and/or AE  instruction.  Goals are: minimal assist.  9. SLP will assess and treat for  .  Goals are:  N/A. 10. Case Management and Social Worker will assess and treat for psychological issues and discharge planning. 11. Team conference will be held weekly to assess progress toward goals and to determine barriers to discharge. 12.  Patient will receive at least 3 hours of therapy per day at least 5 days per week. 13. ELOS and Prognosis: 14-18d good   "I have personally performed a face to face diagnostic evaluation of this patient.  Additionally, I have reviewed and concur with the physician assistant's documentation above."  Erick ColaceAndrew E. Kirsteins M.D. [facility] FAAPM&R (Sports Med, Neuromuscular Med) Diplomate Am Board of Electrodiagnostic Med    Lynnae PrudeDaniel J Angiulli, PA-C 06/22/2018

## 2018-06-23 NOTE — Evaluation (Signed)
Physical Therapy Assessment and Plan  Patient Details  Name: Lori Hardy MRN: [id number] Date of Birth: 1941/01/14  PT Diagnosis: Abnormal posture, Abnormality of gait, Cognitive deficits and Muscle weakness Rehab Potential: Good ELOS: 14-16days    Today's Date: 06/23/2018 PT Individual Time: 1330-1430     60 min   Problem List:  Patient Active Problem List   Diagnosis Date Noted  . Hypoalbuminemia due to protein-calorie malnutrition ([place])   . Diabetes mellitus type 2 in nonobese (HCC)   . Metabolic encephalopathy 10/93/2355  . Encephalopathy   . Acute pyelonephritis   . Debility   . Anemia of chronic disease   . CKD (chronic kidney disease), stage II   . Hypertensive crisis   . Sepsis ([place]) 06/19/2018  . AMS (altered mental status) 06/18/2018  . Weight loss 06/28/2017  . Persistent proteinuria 06/28/2017  . Anemia due to stage 3 chronic kidney disease ([place]) 03/23/2017  . Positive ANA (antinuclear antibody) 02/15/2017  . Posterior capsular opacification of right eye, obscuring vision 12/29/2016  . No advance directive on file 03/16/2016  . Need for hepatitis B vaccination 02/03/2016  . Microalbuminuria due to type 2 diabetes mellitus ([place]) 01/20/2016  . Language barrier to communication 12/18/2015  . Night sweats 11/20/2015  . Acquired hypothyroidism 10/02/2015  . Polyneuropathy due to type 2 diabetes mellitus ([place]) 09/26/2015  . Migraine without aura and without status migrainosus, not intractable 09/26/2015  . Diabetes mellitus ([place]) 08/13/2015  . Gastro-esophageal reflux disease without esophagitis 08/13/2015  . Hyperlipidemia 08/13/2015  . Hypertension 08/13/2015  . 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 Unspecified cirrhosis of liver ([place]) 08/13/2015  . Hyperlipidemia associated with type 2 diabetes mellitus ([place]) 06/26/2015  . Primary open-angle glaucoma 04/14/2015    Past Medical History:  Past Medical History:  Diagnosis Date  . Aortic atherosclerosis ([place])  12/15/2015   Overview:  By imaging, xray 11/2015.  . Closed fracture of right proximal humerus 04/06/2017  . Compression fracture of L4 lumbar vertebra, closed, initial encounter ([place]) 01/12/2017  . Diabetes mellitus without complication ([place])   . Exposure to TB 09/26/2015  . 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  Differential includes diet, medications, possible pancreatic insufficiency.  Negative C. difficile 09/27/2015  . H. pylori infection 10/07/2016   Overview:  BX confirmed. 09/15/2016- Dr. Ferdinand Lango, [facility].   Marland Kitchen History of compression fracture of spine 11/20/2015   Overview:  T12, on prolia.  Marland Kitchen History of Helicobacter pylori infection 11/20/2015   Overview:  By EGD 2017.  Treated with 2 courses of antibiotics at [facility].  . Hx of pancreatitis 09/26/2015   Overview:  08/2015 [facility]  . Hypertension   . Hyponatremia 04/18/2017  . 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 No active disease on chest xray 11/25/15. Pt from [place], past exposure to TB. Pt agreed to referral and treatment by [facility] office- referral sent 12/15/2015 (telephone [phone], fax [phone]).   . 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 Pseudophakia 06/11/2015  . Seasonal allergic rhinitis 09/02/2016  . Senile osteoporosis 06/26/2015   Overview:  Bone density 12/2014 [facility]. T-4.4 and -3.8. On prolia managed by Dr. Posey Pronto.  Normal intact PTH in 2017.  Marland Kitchen Thrombocytopenia ([place]) 09/26/2015  . Xerosis  of skin 04/14/2016   Past Surgical History:  Past Surgical History:  Procedure Laterality Date  . cataracts      Assessment & Plan Clinical Impression: Patient is a -year-old right handed limited English-speaking  female with history of type 2 diabetes mellitus, hyperlipidemia, anemia chronic disease,CKD stage II. Per chart review patient lives with son and family. Used a walker prior to admission. She has a home health aide 2 hours a day. Daughter-in-law is available. Presented 06/18/2018 with complaints of headache, altered mental status, vague left side weakness and vomiting. Noted fever 104, lactic acid 3.5,urine study greater 100,000 enterococcus faecalis. Cranial CT scan negative for acute changes. Noted small remote lacunar infarction posteriorly in the right lentiform nucleus and left caudate head. MRI negative for acute changes. CT angiogram of head and neck with no dissection or aneurysm. Lumbar puncture completed glucose elevated 200, protein 55, Debbie BC of 8. Ultrasound the abdomen negative. Blood cultures no growth to date. Currently maintained on Rocephin for UTI 7 days. EEG negative for seizure. Maintained on Keppra for seizure prophylaxis. Neurology follow-up suspect septic metabolic encephalopathy. Hospital course complicated by hypertensive crisis and monitored. Subcutaneous Lovenox for DVT prophylaxis.   Patient transferred to CIR on 06/22/2018 .   Patient currently requires max with mobility secondary to muscle weakness, decreased cardiorespiratoy endurance, decreased attention, decreased awareness, decreased problem solving, decreased safety awareness and delayed processing and decreased sitting balance, decreased standing balance, decreased postural control and decreased balance strategies.  Prior to hospitalization, patient was modified independent  with mobility and lived with Son in a House home.  Home access is 3Stairs to enter.  Patient will benefit from skilled PT intervention to maximize safe functional mobility, minimize fall risk and decrease caregiver burden for planned discharge home with 24 hour supervision.  Anticipate patient will benefit from follow up [place] at discharge.  PT - End  of Session Activity Tolerance: Tolerates < 10 min activity, no significant change in vital signs Endurance Deficit: Yes PT Assessment Rehab Potential (ACUTE/IP ONLY): Good PT Barriers to Discharge: Inaccessible home environment;Decreased caregiver support;Medical stability;Lack of/limited family support PT Patient demonstrates impairments in the following area(s): Balance;Behavior;Endurance;Motor;Safety;Perception;Pain PT Transfers Functional Problem(s): Bed Mobility;Bed to Chair;Car;Furniture;Floor PT Locomotion Functional Problem(s): Ambulation;Wheelchair Mobility;Stairs PT Plan PT Intensity: Minimum of 1-2 x/day ,45 to 90 minutes PT Frequency: 5 out of 7 days PT Duration Estimated Length of Stay: 14-16days  PT Treatment/Interventions: Ambulation/gait training;Balance/vestibular training;Cognitive remediation/compensation;Community reintegration;Disease management/prevention;Discharge planning;DME/adaptive equipment instruction;Functional mobility training;Neuromuscular re-education;Pain management;Patient/family education;Psychosocial support;Skin care/wound management;Splinting/orthotics;Therapeutic Activities;Therapeutic Exercise;UE/LE Strength taining/ROM;UE/LE Arts development officer;Wheelchair propulsion/positioning;Visual/perceptual remediation/compensation PT Transfers Anticipated Outcome(s): supervision assist with LRAD  PT Locomotion Anticipated Outcome(s): Supervision assist with LRAD for household  PT Recommendation Follow Up Recommendations: Home health PT Patient destination: Home Equipment Recommended: Wheelchair (measurements);Wheelchair cushion (measurements);Rolling walker with 5" wheels  Skilled Therapeutic Intervention Pt received supine in bed and agreeable to PT. Supine>sit transfer with mod assist and mod cues . Pt noted to be incontinent when attempting to sit<>stand. PT transported pt to [facility] with squat pivot and mod-max assist toilet transfer with with  mod  Assist to [facility] over toilet. Pt with noted processing challenges when attempting transfers and peri care, which was then completed by PT. PT instructed patient in PT Evaluation and initiated treatment intervention; see below for results. PT educated patient in [place], rehab potential, rehab goals, and discharge recommendations. . Pt returned to room and performed stedy transfer to bed with mod due to poor awareness. Sit>supine completed with min assist,  and left supine in bed with call bell in reach and all needs met.     PT Evaluation Precautions/Restrictions   General   Vital SignsTherapy Vitals Temp: 98.5 F (36.9 C) Pulse Rate: 79 Resp: 16 BP: (!) 160/73 Patient Position (if appropriate): Lying Oxygen Therapy SpO2: 100 % O2 Device: Room Air Pain   Home Living/Prior Functioning Home Living Available Help at Discharge: Family;Available 24 hours/day Type of Home: House Home Access: Stairs to enter [employer] of Steps: 3 Entrance Stairs-Rails: None Home Layout: Multi-level;Able to live on main level with bedroom/bathroom;Full bath on main level;Laundry or work area in basement [employer]: Multimedia programmer: Programmer, systems: Yes Additional Comments: aide 1030 until 1230 daily to asisst with adls  Lives With: Son Prior Function Comments: uses rollator or SPC usually Vision/Perception  Perception Perception: Within Functional Limits Praxis Praxis: Intact  Cognition Overall Cognitive Status: Impaired/Different from baseline Attention: Alternating;Selective Selective Attention: Impaired Alternating Attention: Impaired Problem Solving: Impaired Safety/Judgment: Impaired Sensation Sensation Light Touch: Appears Intact(difficulty d/t language barrier) Proprioception: Appears Intact Coordination Gross Motor Movements are Fluid and Coordinated: No Fine Motor Movements are Fluid and Coordinated: No Coordination and Movement  Description: poor sustained use of LLE with transfers.  Motor  Motor Motor: Motor impersistence;Other (comment)  Mobility Bed Mobility Bed Mobility: Rolling Right;Rolling Left;Supine to Sit;Sit to Supine Rolling Right: Moderate Assistance - Patient 50-74% Rolling Left: Moderate Assistance - Patient 50-74% Supine to Sit: Moderate Assistance - Patient 50-74% Sit to Supine: Minimal Assistance - Patient > 75% Transfers Transfers: Sit to Stand;Stand Pivot Transfers;Squat Pivot Transfers Sit to Stand: Moderate Assistance - Patient 50-74% Stand Pivot Transfers: Moderate Assistance - Patient 50 - 74% Squat Pivot Transfers: Moderate Assistance - Patient 50-74% Locomotion  Gait Ambulation: No Gait Gait: No Stairs / Additional Locomotion Stairs: No Wheelchair Mobility Wheelchair Mobility: Yes Wheelchair Assistance: Moderate Assistance - Patient 50 - 74% Wheelchair Propulsion: Both upper extremities Wheelchair Parts Management: Needs assistance Distance: 10  Trunk/Postural Assessment  Cervical Assessment Cervical Assessment: (head foreward) Thoracic Assessment Thoracic Assessment: (rounded shoudlers) Lumbar Assessment Lumbar Assessment: (posterior pelvic tilt) Postural Control Postural Control: Deficits on evaluation(insufficient)  Balance Balance Balance Assessed: Yes Dynamic Sitting Balance Dynamic Sitting - Level of Assistance: 4: Min assist;5: Stand by assistance Static Standing Balance Static Standing - Level of Assistance: 2: Max assist Extremity Assessment       RLE: grossly 3+/5 with funcitonal movement.   LLE Grossly 3+/5 with functional movement  Unable to formally assess due to language barrier.    Refer to Care Plan for Long Term Goals  Recommendations for other services: None   Discharge Criteria: Patient will be discharged from PT if patient refuses treatment 3 consecutive times without medical reason, if treatment goals not met, if there is a change in  medical status, if patient makes no progress towards goals or if patient is discharged from hospital.  The above assessment, treatment plan, treatment alternatives and goals were discussed and mutually agreed upon: by patient  [name] 06/23/2018, 3:22 PM

## 2018-06-23 NOTE — Progress Notes (Signed)
Social Work  Social Work Assessment and Plan  Patient Details  Name: Lori Hardy MRN: [id number] Date of Birth: [birth year]  Today's Date: 06/23/2018  Problem List:  Patient Active Problem List   Diagnosis Date Noted  . Acute blood loss anemia   . Acute lower UTI   . Hypoalbuminemia due to protein-calorie malnutrition (HCC)   . Diabetes mellitus type 2 in nonobese (HCC)   . Metabolic encephalopathy 06/22/2018  . Encephalopathy   . Acute pyelonephritis   . Debility   . Anemia of chronic disease   . CKD (chronic kidney disease), stage II   . Hypertensive crisis   . Sepsis (HCC) 06/19/2018  . AMS (altered mental status) 06/18/2018  . Weight loss 06/28/2017  . Persistent proteinuria 06/28/2017  . Anemia due to stage 3 chronic kidney disease (HCC) 03/23/2017  . Positive ANA (antinuclear antibody) 02/15/2017  . Posterior capsular opacification of right eye, obscuring vision 12/29/2016  . No advance directive on file 03/16/2016  . Need for hepatitis B vaccination 02/03/2016  . Microalbuminuria due to type 2 diabetes mellitus (HCC) 01/20/2016  . Language barrier to communication 12/18/2015  . Night sweats 11/20/2015  . Acquired hypothyroidism 10/02/2015  . Polyneuropathy due to type 2 diabetes mellitus (HCC) 09/26/2015  . Migraine without aura and without status migrainosus, not intractable 09/26/2015  . Diabetes mellitus (HCC) 08/13/2015  . Gastro-esophageal reflux disease without esophagitis 08/13/2015  . Hyperlipidemia 08/13/2015  . Hypertension 08/13/2015  . 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 Unspecified cirrhosis of liver (HCC) 08/13/2015  . Hyperlipidemia associated with type 2 diabetes mellitus (HCC) 06/26/2015  . Primary open-angle glaucoma 04/14/2015   Past Medical History:  Past Medical History:  Diagnosis Date  . Aortic atherosclerosis (HCC) 12/15/2015   Overview:  By imaging, xray 11/2015.  . Closed fracture of right proximal humerus 04/06/2017  . Compression  fracture of L4 lumbar vertebra, closed, initial encounter (HCC) 01/12/2017  . Diabetes mellitus without complication (HCC)   . Exposure to TB 09/26/2015  . 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  Differential includes diet, medications, possible pancreatic insufficiency.  Negative C. difficile 09/27/2015  . H. pylori infection 10/07/2016   Overview:  BX confirmed. 09/15/2016- Dr. Noe Gens, [facility].   Marland Kitchen History of compression fracture of spine 11/20/2015   Overview:  T12, on prolia.  Marland Kitchen History of Helicobacter pylori infection 11/20/2015   Overview:  By EGD 2017.  Treated with 2 courses of antibiotics at [facility].  . Hx of pancreatitis 09/26/2015   Overview:  08/2015 [facility]  . Hypertension   . Hyponatremia 04/18/2017  . 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 No active disease on chest xray 11/25/15. Pt from [place], past exposure to TB. Pt agreed to referral and treatment by [facility]- referral sent 12/15/2015 (telephone [phone], fax #[phone]).   . 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 Pseudophakia 06/11/2015  . Seasonal allergic rhinitis 09/02/2016  . Senile osteoporosis 06/26/2015   Overview:  Bone density 12/2014 [facility]. T-4.4 and -3.8. On prolia managed by Dr. Allena Katz.  Normal intact PTH in 2017.  Marland Kitchen Thrombocytopenia (HCC) 09/26/2015  . Xerosis of skin 04/14/2016   Past Surgical History:  Past Surgical History:  Procedure Laterality Date  .  cataracts     Social History:  reports that she has never smoked. She has never used smokeless tobacco. No history on file for alcohol and drug.  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   Abuse/Neglect Abuse/Neglect Assessment Can Be Completed: Yes Physical Abuse: Denies Verbal Abuse: Denies Sexual Abuse: Denies Exploitation of patient/patient's resources: Denies Self-Neglect: Denies  Emotional Status Pt's affect, behavior and adjustment status: Pt lying in bed and offers little eye contact as I speak with her daughter-in-law.  Her affect is flat.  She tells dtr-in-law that her back hurts and is concerned about her blood sugars.  No significant emotional distress noted or reported.  Pt does express some frustration with busy therapy schedule as she feels this is too much to expect of her. Recent Psychosocial Issues: None Psychiatric History: None Substance Abuse History: None  Patient / Family Perceptions, Expectations & Goals Pt/Family understanding of illness & functional limitations: Pt reports that she is on CIR because of a fall in which she "hurt my back."    Daughter-in-law reports that she has been very sedentary and "mostly in bed" for ~ 3yr. Premorbid pt/family roles/activities: Pt was able to toilet and dress herself but mobility was limited. Anticipated changes in roles/activities/participation: Dependent on pt's participation - goals set for supervision/ min, however, if participation remains limited, then dtr-in-law and son will need to increase caregiver  support. Pt/family expectations/goals: Pt does not state any goals.  Family hopeful she can reach some supervision level.  Community [employer] Agencies: None Premorbid Home Care/DME Agencies: Other (Comment)(Dtr-in-law uncertain of who the PCS aide agency was) Transportation available at discharge: yes  Discharge Planning Living Arrangements: Children Support Systems: Children Type of Residence: Private residence Insurance Resources: Medicare, [place] (specify county[place]Medicaid is primary; Medicare is part B only) Architect: Restaurant manager, fast food Screen Referred: No Living Expenses: Lives with family Money Management: Family Does the patient have any problems obtaining your medications?: No Home Management: Primarily the daughter-in-law was management upkeep of the home. Patient/Family Preliminary Plans: Pt and family state goal of pt to d/c home at supervision/ light min assist LOF Social Work Anticipated Follow Up Needs: HH/OP Expected length of stay: ELOS 14 to 18 days  Clinical Impression Elderly woman here with metabolic encephalopathy in setting of sepsis and now with significant physical and cognitive deficits.  Of note, she is originally from [place] and does not speak [place].  Dtr-in-law present to assist with assessment.  Family concerned about pt's current level of debility and providing care in the home.  Will need interpreter assist throughout pt's stay.  Will follow for d/c planning needs.  Alyla Pietila 06/23/2018, 3:11 PM

## 2018-06-24 ENCOUNTER — Inpatient Hospital Stay (HOSPITAL_COMMUNITY): Payer: Medicare Other | Admitting: Physical Therapy

## 2018-06-24 ENCOUNTER — Inpatient Hospital Stay (HOSPITAL_COMMUNITY): Payer: Medicare Other

## 2018-06-24 DIAGNOSIS — G9341 Metabolic encephalopathy: Secondary | ICD-10-CM | POA: Diagnosis not present

## 2018-06-24 LAB — GLUCOSE, CAPILLARY
Glucose-Capillary: 209 mg/dL — ABNORMAL HIGH (ref 70–99)
Glucose-Capillary: 240 mg/dL — ABNORMAL HIGH (ref 70–99)
Glucose-Capillary: 345 mg/dL — ABNORMAL HIGH (ref 70–99)
Glucose-Capillary: 280 mg/dL — ABNORMAL HIGH (ref 70–99)

## 2018-06-24 MED ORDER — SIMETHICONE 80 MG PO CHEW
80.0000 mg | CHEWABLE_TABLET | Freq: Four times a day (QID) | ORAL | Status: DC | PRN
  Administered 2018-06-24 – 2018-06-26 (×4): 80 mg via ORAL
  Filled 2018-06-24 (×6): qty 1

## 2018-06-24 NOTE — Progress Notes (Signed)
[place] PHYSICAL MEDICINE & REHABILITATION PROGRESS NOTE  Subjective/Complaints: Pt in bed. Has no obvious complaints. No problems reported by nursing  ROS: limited due to language/communication    Objective: Vital Signs: Blood pressure 131/83, pulse 77, temperature 97.9 F (36.6 C), temperature source Oral, resp. rate 16, height 5\' 4"  (1.626 m), weight 70.6 kg, SpO2 100 %. No results found. Recent Labs    06/22/18 1924 06/23/18 0506  WBC 9.4 9.1  HGB 10.3* 9.5*  HCT 32.6* 30.6*  PLT 177 178   Recent Labs    06/22/18 0944 06/22/18 1924 06/23/18 0506  NA 134*  --  136  K 3.2*  --  4.1  CL 104  --  104  CO2 22  --  22  GLUCOSE 242*  --  242*  BUN 8  --  8  CREATININE 0.98 0.98 0.91  CALCIUM 8.3*  --  8.3*    Physical Exam: BP 131/83 (BP Location: Right Arm)   Pulse 77   Temp 97.9 F (36.6 C) (Oral)   Resp 16   Ht 5\' 4"  (1.626 m)   Wt 70.6 kg   SpO2 100%   BMI 26.72 kg/m  Constitutional: No distress . Vital signs reviewed. HEENT: EOMI, oral membranes moist Neck: supple Cardiovascular: RRR without murmur. No JVD    Respiratory: CTA Bilaterally without wheezes or rales. Normal effort    GI: BS +, non-tender, non-distended  Musc: No edema or tenderness in extremities. Neuro: Alert Motor: Grossly appears to be 4+/5 throughout Skin: Warm and dry.  Intact. Psych: appears confused. Limited by language barrier   Assessment/Plan: 1. Functional deficits secondary to metabolic encephalopathy which require 3+ hours per day of interdisciplinary therapy in a comprehensive inpatient rehab setting.  Physiatrist is providing close team supervision and 24 hour management of active medical problems listed below.  Physiatrist and rehab team continue to assess barriers to discharge/monitor patient progress toward functional and medical goals  Care Tool:  Bathing    Body parts bathed by patient: Chest, Abdomen, Face(declines other body parts)   Body parts bathed by  helper: Right arm, Left arm     Bathing assist Assist Level: Moderate Assistance - Patient 50 - 74%     Upper Body Dressing/Undressing Upper body dressing   What is the patient wearing?: Pull over shirt    Upper body assist Assist Level: Dependent - Patient 0%    Lower Body Dressing/Undressing Lower body dressing      What is the patient wearing?: Underwear/pull up     Lower body assist Assist for lower body dressing: Dependent - Patient 0%     Toileting Toileting    Toileting assist Assist for toileting: Contact Guard/Touching assist     Transfers Chair/bed transfer  Transfers assist  Chair/bed transfer activity did not occur: Safety/medical concerns  Chair/bed transfer assist level: Maximal Assistance - Patient 25 - 49%     Locomotion Ambulation   Ambulation assist   Ambulation activity did not occur: Safety/medical concerns          Walk 10 feet activity   Assist  Walk 10 feet activity did not occur: Safety/medical concerns        Walk 50 feet activity   Assist Walk 50 feet with 2 turns activity did not occur: Safety/medical concerns         Walk 150 feet activity   Assist Walk 150 feet activity did not occur: Safety/medical concerns  Walk 10 feet on uneven surface  activity   Assist Walk 10 feet on uneven surfaces activity did not occur: Safety/medical concerns         Wheelchair     Assist Will patient use wheelchair at discharge?: Yes Type of Wheelchair: Power    Wheelchair assist level: Moderate Assistance - Patient 50 - 74% Max wheelchair distance: 5    Wheelchair 50 feet with 2 turns activity    Assist    Wheelchair 50 feet with 2 turns activity did not occur: Safety/medical concerns       Wheelchair 150 feet activity     Assist Wheelchair 150 feet activity did not occur: Safety/medical concerns          Medical Problem List and Plan: 1.  Decreased functional mobility with  debilitation secondary to septic metabolic encephalopathy/urosepsis.   Blood cultures negative.  CSF cultures NGTD  Continue CIR    2.  DVT Prophylaxis/Anticoagulation: subcutaneous Lovenox. Monitor for any bleeding episodes monitor platelets 3. Pain Management:  Tylenol as needed, avoid narcotic analgesics given age and fall risk 4. Mood:  Provide emotional support 5. Neuropsych: This patient is  not capable of making decisions on her own behalf. 6. Skin/Wound Care:  Routine skin checks 7. Fluids/Electrolytes/Nutrition:  Routine in and out's   BMP within acceptable range on 2/14 8. Hypertension. Norvasc 5 mg daily,Cozaar 50 mg daily, Lopressor 25 mg twice a day.   Controlled 2/15 9. Seizure prophylaxis. Keppra 500 mg twice a day. EEG negative 10. Diabetes mellitus. Hemoglobin A1c 9.7.  Lantus insulin 14 units daily. Check blood sugars before meals and at bedtime  Monitor with increased mobility 11. Urosepsis/enterococcus faecalis.   Continue amoxicillin through 2/18 12. Hypothyroidism. Synthroid 13.  Hypokalemia supplement potassium 14.  Hypoalbuminemia  Supplement initiated on 2/14 15.  Anemia of chronic disease  Hemoglobin 9.5 on 2/14  Labs ordered for Monday  LOS: 2 days A FACE TO FACE EVALUATION WAS PERFORMED  Ranelle Oyster 06/24/2018, 8:47 AM

## 2018-06-24 NOTE — Progress Notes (Signed)
Physical Therapy Session Note  Patient Details  Name: Lori Hardy MRN: [id number] Date of Birth: 1941/05/08  Today's Date: 06/24/2018 PT Individual Time: [phone] and 1400-1500 PT Individual Time Calculation (min): 70 min and 60 min   Short Term Goals: Week 1:  PT Short Term Goal 1 (Week 1): Pt will remain OOB > 2 hours to improve overall endurance  PT Short Term Goal 2 (Week 1): Pt will ambulate 14f with Mod assist and LRAD  PT Short Term Goal 3 (Week 1): Pt eill remain standing > 2 minutes to prepare for pregait tasks.  PT Short Term Goal 4 (Week 1): Pt will propel WC >55fwith min assist   Skilled Therapeutic Interventions/Progress Updates:  Session 1.  Pt received supine in bed. Per phone interpreter, she needs to use bathroom, and agreeable to PT. Supine>sit transfer with mod assist and moderate cues. Stedy transfer to bathroom with min assist to come to standing. Pt able to sustain partial stand in stey with no visible change in status. Stand<>sit at toilet in stedy x 4 with mod assist for pericare and clothing management performed by PT. Pt noted ot return to sitting with uncontrolled descent twice, and states that she cannot stand. Stedy [facility] assist. Pt [facility]hab [facility]<>stand in stedy to perform orthostatic vital assessement. 166/70, HR 76. In standing pt noted to have decreased trunk and head control with mild decrease in responsiveness. Max + 2 assist to return to [facility] in [facility] gradual improvement in alertness. Unable to measure BP in stading. PT Donned thigh high teds. LE strengthening LAQ x 5 BLE with intermittent participation. Pt returned to room and performed stedy transfer to bed with min assist. Sit>supine completed with moderate assist and left supine in bed with call bell in reach and all needs met.    Session 2.  Pt received supine in bed, Per interpreter via phone, Pt initially reports need for use of bathroom, but educated  that RN had just cleaned incontinent bowel movement, pt then agreeable to limited therapy. Supine>sit transfer with min assist and cues from PT. Pt performed stand pivot transfer to [facility] mod assist and UE support on WC. PT transported pt [facility] rehab [facility]<>stand but states "dizziness" Interpreter explains that she may be speaking of nausea, with multiple dry heaves noted while sitting in WC, then repeatedly states that she needs to use bathroom. Pt transported back to room and performed stedy transfer to toilet with mod assist. Pt able to have continent bowel movement. Pericare and clothing management performed by PT with sit<>stand in stedy x 3. Transfer back to [facility] mod assist in stedy. After prolonged rest break, ambulatory transfer to bed x [id number] mod assist from PT and moderate cues for AD management.  Sit>supine completed with min assist, and left supine in bed with call bell in reach and all needs met.         Therapy Documentation Precautions:  Precautions Precautions: Fall, Other (comment) Restrictions Weight Bearing Restrictions: No    Vital Signs: Therapy Vitals Temp: 97.9 F (36.6 C) Temp Source: Oral Pulse Rate: 77 Resp: 16 BP: 131/83 Patient Position (if appropriate): Lying Oxygen Therapy SpO2: 100 % O2 Device: Room Air Pain:   Faces: moderate pain. Abdomin.     Therapy/Group: Individual Therapy  AusLorie Phenix15/2020, 9:16 AM

## 2018-06-24 NOTE — Progress Notes (Signed)
Occupational Therapy Session Note  Patient Details  Name: Lori Hardy MRN: [id number] Date of Birth: [birth year]  Today's Date: 06/24/2018 OT Individual Time: 1300-1335 OT Individual Time Calculation (min): 35 min    Short Term Goals: Week 1:  OT Short Term Goal 1 (Week 1): Pt will transfer wiht MOD A to toilet and LRAD OT Short Term Goal 2 (Week 1): Pt will complete grooming seated at sink to demo improved participation with supervision OT Short Term Goal 3 (Week 1): Pt will complete bathing wiht MOD A OT Short Term Goal 4 (Week 1): Pt will don shirt with MOD A  Skilled Therapeutic Interventions/Progress Updates:    1;1. Son exiting room as OT enters. Utilized phone interpreter on speaker to communicate with pt. Pt given further education on OT role/purpose, CIR, POC, 3 hour therapy requirement, importance of participating in tx, orthostatic/vasovagal syncope, compression socks and OT training preparing clinicians to assist pts with limited mobility. Pt declines participating in bathing and dressing stating she has an aide who helps with this at home. Educated pt that at this point in time she is not at a functional level that can be managed by the aide therefore she needs to participate in tx. Pt repeatedly waving off the OT and ultimately refusing to get out of bed. Pt missed 25 min skilled OT d/t refusal to participate. Pt states, "I will get up with that man [PT who treated pt earlier in the day]. Exited session with pt in bed, call light in reach and all needs met.   Therapy Documentation Precautions:  Precautions Precautions: Fall, Other (comment) Restrictions Weight Bearing Restrictions: No   Therapy/Group: Individual Therapy  Tonny Branch 06/24/2018, 1:43 PM

## 2018-06-24 NOTE — Plan of Care (Signed)
  Problem: Consults Goal: RH GENERAL PATIENT EDUCATION Description See Patient Education module for education specifics. Outcome: Progressing   Problem: RH SKIN INTEGRITY Goal: RH STG SKIN FREE OF INFECTION/BREAKDOWN Description No new breakdown with min assist   Outcome: Progressing   Problem: RH SAFETY Goal: RH STG ADHERE TO SAFETY PRECAUTIONS W/ASSISTANCE/DEVICE Description STG Adhere to Safety Precautions With Min Assistance/Device.  Outcome: Progressing   Problem: RH BLADDER ELIMINATION Goal: RH STG MANAGE BLADDER WITH ASSISTANCE Description STG Manage Bladder With Mod Assistance  Outcome: Not Progressing

## 2018-06-25 ENCOUNTER — Inpatient Hospital Stay (HOSPITAL_COMMUNITY): Payer: Medicare Other

## 2018-06-25 ENCOUNTER — Inpatient Hospital Stay (HOSPITAL_COMMUNITY): Payer: Medicare Other | Admitting: Physical Therapy

## 2018-06-25 DIAGNOSIS — R5381 Other malaise: Secondary | ICD-10-CM | POA: Diagnosis not present

## 2018-06-25 DIAGNOSIS — R140 Abdominal distension (gaseous): Secondary | ICD-10-CM | POA: Diagnosis not present

## 2018-06-25 DIAGNOSIS — E119 Type 2 diabetes mellitus without complications: Secondary | ICD-10-CM | POA: Diagnosis not present

## 2018-06-25 DIAGNOSIS — G9341 Metabolic encephalopathy: Secondary | ICD-10-CM | POA: Diagnosis not present

## 2018-06-25 LAB — GLUCOSE, CAPILLARY
GLUCOSE-CAPILLARY: 312 mg/dL — AB (ref 70–99)
GLUCOSE-CAPILLARY: 314 mg/dL — AB (ref 70–99)
Glucose-Capillary: 162 mg/dL — ABNORMAL HIGH (ref 70–99)
Glucose-Capillary: 188 mg/dL — ABNORMAL HIGH (ref 70–99)

## 2018-06-25 MED ORDER — INSULIN GLARGINE 100 UNIT/ML ~~LOC~~ SOLN
6.0000 [IU] | Freq: Once | SUBCUTANEOUS | Status: AC
  Administered 2018-06-25: 6 [IU] via SUBCUTANEOUS
  Filled 2018-06-25: qty 0.06

## 2018-06-25 MED ORDER — FLEET ENEMA 7-19 GM/118ML RE ENEM
1.0000 | ENEMA | Freq: Once | RECTAL | Status: AC | PRN
  Administered 2018-06-25: 1 via RECTAL
  Filled 2018-06-25: qty 1

## 2018-06-25 MED ORDER — INSULIN GLARGINE 100 UNIT/ML ~~LOC~~ SOLN
20.0000 [IU] | Freq: Every day | SUBCUTANEOUS | Status: DC
  Administered 2018-06-26 – 2018-06-27 (×2): 20 [IU] via SUBCUTANEOUS
  Filled 2018-06-25 (×2): qty 0.2

## 2018-06-25 NOTE — Progress Notes (Addendum)
[place] PHYSICAL MEDICINE & REHABILITATION PROGRESS NOTE  Subjective/Complaints: Patient apparently restless yesterday.  Was complained of some abdominal discomfort/bloating.  Nurse reports that she "dry heaves 2 times last night".  Did not sleep well either and generally anxious.  Did eat well 2 out of 3 meals.  ROS: limited due to language/communication    Objective: Vital Signs: Blood pressure (!) 142/69, pulse 78, temperature 97.9 F (36.6 C), temperature source Oral, resp. rate 16, height 5\' 4"  (1.626 m), weight 70.6 kg, SpO2 100 %. No results found. Recent Labs    06/22/18 1924 06/23/18 0506  WBC 9.4 9.1  HGB 10.3* 9.5*  HCT 32.6* 30.6*  PLT 177 178   Recent Labs    06/22/18 0944 06/22/18 1924 06/23/18 0506  NA 134*  --  136  K 3.2*  --  4.1  CL 104  --  104  CO2 22  --  22  GLUCOSE 242*  --  242*  BUN 8  --  8  CREATININE 0.98 0.98 0.91  CALCIUM 8.3*  --  8.3*    Physical Exam: BP (!) 142/69 (BP Location: Left Arm)   Pulse 78   Temp 97.9 F (36.6 C) (Oral)   Resp 16   Ht 5\' 4"  (1.626 m)   Wt 70.6 kg   SpO2 100%   BMI 26.72 kg/m  Constitutional: No distress . Vital signs reviewed. HEENT: EOMI, oral membranes moist Neck: supple Cardiovascular: RRR without murmur. No JVD    Respiratory: CTA Bilaterally without wheezes or rales. Normal effort    GI: BS high-pitched, abdomen appears distended and slightly tender with deep palpation Musc: No edema or tenderness in extremities. Neuro: Alert Motor: Grossly appears to be 4+/5 throughout Skin: Warm and dry.  Intact. Psych: Confused, fairly calm this morning   Assessment/Plan: 1. Functional deficits secondary to metabolic encephalopathy which require 3+ hours per day of interdisciplinary therapy in a comprehensive inpatient rehab setting.  Physiatrist is providing close team supervision and 24 hour management of active medical problems listed below.  Physiatrist and rehab team continue to assess  barriers to discharge/monitor patient progress toward functional and medical goals  Care Tool:  Bathing    Body parts bathed by patient: Face   Body parts bathed by helper: Right arm, Left arm, Chest, Abdomen, Front perineal area, Buttocks     Bathing assist Assist Level: Maximal Assistance - Patient 24 - 49%     Upper Body Dressing/Undressing Upper body dressing   What is the patient wearing?: Hospital gown only    Upper body assist Assist Level: Minimal Assistance - Patient > 75%    Lower Body Dressing/Undressing Lower body dressing      What is the patient wearing?: Hospital gown only, Incontinence brief     Lower body assist Assist for lower body dressing: Dependent - Patient 0%     Toileting Toileting    Toileting assist Assist for toileting: Minimal Assistance - Patient > 75%     Transfers Chair/bed transfer  Transfers assist  Chair/bed transfer activity did not occur: Safety/medical concerns  Chair/bed transfer assist level: Moderate Assistance - Patient 50 - 74%     Locomotion Ambulation   Ambulation assist   Ambulation activity did not occur: Safety/medical concerns  Assist level: Moderate Assistance - Patient 50 - 74% Assistive device: Walker-rolling Max distance: 5   Walk 10 feet activity   Assist  Walk 10 feet activity did not occur: Safety/medical concerns  Walk 50 feet activity   Assist Walk 50 feet with 2 turns activity did not occur: Safety/medical concerns         Walk 150 feet activity   Assist Walk 150 feet activity did not occur: Safety/medical concerns         Walk 10 feet on uneven surface  activity   Assist Walk 10 feet on uneven surfaces activity did not occur: Safety/medical concerns         Wheelchair     Assist Will patient use wheelchair at discharge?: Yes Type of Wheelchair: Manual    Wheelchair assist level: Minimal Assistance - Patient > 75% Max wheelchair distance: 89ft      Wheelchair 50 feet with 2 turns activity    Assist    Wheelchair 50 feet with 2 turns activity did not occur: Safety/medical concerns       Wheelchair 150 feet activity     Assist Wheelchair 150 feet activity did not occur: Safety/medical concerns          Medical Problem List and Plan: 1.  Decreased functional mobility with debilitation secondary to septic metabolic encephalopathy/urosepsis.   Blood cultures negative.  CSF cultures NGTD  Continue CIR    2.  DVT Prophylaxis/Anticoagulation: subcutaneous Lovenox. Monitor for any bleeding episodes monitor platelets 3. Pain Management:  Tylenol as needed, avoid narcotic analgesics given age and fall risk 4. Mood:  Provide emotional support 5. Neuropsych: This patient is  not capable of making decisions on her own behalf. 6. Skin/Wound Care:  Routine skin checks 7. Fluids/Electrolytes/Nutrition:  Routine in and out's   BMP within acceptable range on 2/14  -Seems to be eating fairly well despite reports from nursing of dry heaves 8. Hypertension. Norvasc 5 mg daily,Cozaar 50 mg daily, Lopressor 25 mg twice a day.   Controlled 2/16 9. Seizure prophylaxis. Keppra 500 mg twice a day. EEG negative 10. Diabetes mellitus. Hemoglobin A1c 9.7.  Lantus insulin 14 units daily. Check blood sugars before meals and at bedtime  Sugars very poorly controlled at present, increase Lantus insulin to 20 units  11. Urosepsis/enterococcus faecalis.   Continue amoxicillin through 2/18 12. Hypothyroidism. Synthroid 13.  Hypokalemia supplement potassium 14.  Hypoalbuminemia  Supplement initiated on 2/14 15.  Anemia of chronic disease  Hemoglobin 9.5 on 2/14  Labs ordered for Monday 16.  Abdominal distention, bloating, ?nausea/vomiting  -Check KUB today  -Simethicone as needed  -Further treatment pending x-ray results  -Completing treatment for UTI on 2/18.  May be having some nausea from antibiotic  LOS: 3 days A FACE TO FACE  EVALUATION WAS PERFORMED  Ranelle Oyster 06/25/2018, 8:13 AM

## 2018-06-25 NOTE — Progress Notes (Signed)
Physical Therapy Session Note  Patient Details  Name: Lori Hardy MRN: [id number] Date of Birth: 15-Feb-1941  Today's Date: 06/25/2018 PT Individual Time: 0930-1024 PT Individual Time Calculation (min): 54 min   Short Term Goals: Week 1:  PT Short Term Goal 1 (Week 1): Pt will remain OOB > 2 hours to improve overall endurance  PT Short Term Goal 2 (Week 1): Pt will ambulate 23ft with Mod assist and LRAD  PT Short Term Goal 3 (Week 1): Pt eill remain standing > 2 minutes to prepare for pregait tasks.  PT Short Term Goal 4 (Week 1): Pt will propel WC >42ft with min assist   Skilled Therapeutic Interventions/Progress Updates:    pt rec'd in bed. Requires max encouragement to participate in out of bed activities. Pt eventually able to communicate that she fears falling and requests +2 assist. Pt able to perform bed mobility with min A.  Sit <> Stand in stedy from elevated bed with min A (+2 for pt comfort). Pt states need to toilet. Pt transferred with stedy to toilet with min A to stand from stedy. Pt continent of bowel. Pt unable to stand >30 seconds due to fatigue, requires multiple sit <> stands for hygiene after toileting. Pt with forward trunk lean despite cuing to stand tall. Pt then requests to return to bed. Pt min A for sit to supine, max A to scoot up in bed. Pt left in bed with all needs at hand, alarm set.  Therapy Documentation Precautions:  Precautions Precautions: Fall, Other (comment) Restrictions Weight Bearing Restrictions: No Pain:  pt with no c/o pain during session   Therapy/Group: Individual Therapy  Medford Staheli 06/25/2018, 10:24 AM

## 2018-06-25 NOTE — Plan of Care (Signed)
  Problem: Consults Goal: RH GENERAL PATIENT EDUCATION Description See Patient Education module for education specifics. Outcome: Progressing   Problem: RH BLADDER ELIMINATION Goal: RH STG MANAGE BLADDER WITH ASSISTANCE Description STG Manage Bladder With Mod Assistance  Outcome: Progressing   Problem: RH SKIN INTEGRITY Goal: RH STG SKIN FREE OF INFECTION/BREAKDOWN Description No new breakdown with min assist   Outcome: Progressing   Problem: RH SAFETY Goal: RH STG ADHERE TO SAFETY PRECAUTIONS W/ASSISTANCE/DEVICE Description STG Adhere to Safety Precautions With Min Assistance/Device.  Outcome: Progressing

## 2018-06-25 NOTE — Progress Notes (Signed)
Occupational Therapy Session Note  Patient Details  Name: Lori Hardy MRN: [id number] Date of Birth: May 01, 1941  Today's Date: 06/25/2018 OT Individual Time: 1300-1325 OT Individual Time Calculation (min): 25 min  and Today's Date: 06/25/2018 OT Missed Time: 35 Minutes Missed Time Reason: Patient unwilling/refused to participate without medical reason   Short Term Goals: Week 1:  OT Short Term Goal 1 (Week 1): Pt will transfer wiht MOD A to toilet and LRAD OT Short Term Goal 2 (Week 1): Pt will complete grooming seated at sink to demo improved participation with supervision OT Short Term Goal 3 (Week 1): Pt will complete bathing wiht MOD A OT Short Term Goal 4 (Week 1): Pt will don shirt with MOD A  Skilled Therapeutic Interventions/Progress Updates:    Pt received supine in bed with no c/o pain per interpretor. Pt required max encouragement for OOB mobility initially declining. +2 present to reassure pt with fear of falling. Pt eventually transitioned to EOB with mod A. Pt completed SPT to w/c with mod HHA. Once sitting, attempted to both demonstrate and use interpretor to ask pt to scoot back in chair. Pt waving hand every time at therapist. Pt eventually said "i'm too tired to talk , I need to go back to bed" via interpretor. Pt was transferred back to bed and positioned supine with bed alarm set. With interpretor assistance pt was educated on rehab expectations, risks of remaining in bed, and goals of therapy. 35 min skilled OT missed.   Therapy Documentation Precautions:  Precautions Precautions: Fall, Other (comment) Restrictions Weight Bearing Restrictions: No General: General OT Amount of Missed Time: 35 Minutes   Pain: Pain Assessment Pain Scale: 0-10 Pain Score: 0-No pain   Therapy/Group: Individual Therapy  Crissie Reese 06/25/2018, 1:35 PM

## 2018-06-26 ENCOUNTER — Inpatient Hospital Stay (HOSPITAL_COMMUNITY): Payer: Medicare Other | Admitting: Physical Therapy

## 2018-06-26 ENCOUNTER — Inpatient Hospital Stay (HOSPITAL_COMMUNITY): Payer: Medicare Other | Admitting: Speech Pathology

## 2018-06-26 ENCOUNTER — Inpatient Hospital Stay (HOSPITAL_COMMUNITY): Payer: Medicare Other | Admitting: Occupational Therapy

## 2018-06-26 ENCOUNTER — Inpatient Hospital Stay (HOSPITAL_COMMUNITY): Payer: Medicare Other

## 2018-06-26 DIAGNOSIS — N390 Urinary tract infection, site not specified: Secondary | ICD-10-CM

## 2018-06-26 DIAGNOSIS — D638 Anemia in other chronic diseases classified elsewhere: Secondary | ICD-10-CM | POA: Diagnosis not present

## 2018-06-26 DIAGNOSIS — D62 Acute posthemorrhagic anemia: Secondary | ICD-10-CM

## 2018-06-26 DIAGNOSIS — G9341 Metabolic encephalopathy: Secondary | ICD-10-CM | POA: Diagnosis not present

## 2018-06-26 DIAGNOSIS — E119 Type 2 diabetes mellitus without complications: Secondary | ICD-10-CM | POA: Diagnosis not present

## 2018-06-26 LAB — GLUCOSE, CAPILLARY
Glucose-Capillary: 168 mg/dL — ABNORMAL HIGH (ref 70–99)
Glucose-Capillary: 316 mg/dL — ABNORMAL HIGH (ref 70–99)
Glucose-Capillary: 222 mg/dL — ABNORMAL HIGH (ref 70–99)
Glucose-Capillary: 198 mg/dL — ABNORMAL HIGH (ref 70–99)

## 2018-06-26 NOTE — Care Management (Signed)
[facility] Individual Statement of Services  Patient Name:  Lori Hardy  Date:  06/26/2018  Welcome to the [facility].  Our goal is to provide you with an individualized program based on your diagnosis and situation, designed to meet your specific needs.  With this comprehensive rehabilitation program, you will be expected to participate in at least 3 hours of rehabilitation therapies Monday-Friday, with modified therapy programming on the weekends.  Your rehabilitation program will include the following services:  Physical Therapy (PT), Occupational Therapy (OT), Speech Therapy (ST), 24 hour per day rehabilitation nursing, Therapeutic Recreaction (TR), Case Management (Social Worker), Rehabilitation Medicine, Nutrition Services and Pharmacy Services  Weekly team conferences will be held on Wednesdays to discuss your progress.  Your Social Worker will talk with you frequently to get your input and to update you on team discussions.  Team conferences with you and your family in attendance may also be held.  Expected length of stay: 14-16 days   Overall anticipated outcome: minimal assistance  Depending on your progress and recovery, your program may change. Your Social Worker will coordinate services and will keep you informed of any changes. Your Social Worker's name and contact numbers are listed  below.  The following services may also be recommended but are not provided by the [facility]:   Driving Evaluations  Home Health Rehabiltiation Services  Outpatient Rehabilitation Services   Arrangements will be made to provide these services after discharge if needed.  Arrangements include referral to agencies that provide these services.  Your insurance has been verified to be:  Medicaid, Medicare Your primary doctor is:  Lovell Sheehan  Pertinent information will be shared with your doctor and your insurance company.  Social  Worker:  [place], [place] [phone] or ([phone]   Information discussed with and copy given to patient by: Amada Jupiter, 06/26/2018, 1:25 PM

## 2018-06-26 NOTE — Progress Notes (Signed)
This writer had to sit at the patient bedside most of this shift until she was able to drift off to sleep. Once patient was asleep Clinical research associate exited the room. Frequent rounds made throughout the shift.----------DKearse,LPN

## 2018-06-26 NOTE — Progress Notes (Signed)
Physical Therapy Session Note  Patient Details  Name: Lori Hardy MRN: [id number] Date of Birth: 04-24-1941  Today's Date: 06/26/2018 PT Individual Time: 5638-7564 PT Individual Time Calculation (min): 18 min   Short Term Goals: Week 1:  PT Short Term Goal 1 (Week 1): Pt will remain OOB > 2 hours to improve overall endurance  PT Short Term Goal 2 (Week 1): Pt will ambulate 80ft with Mod assist and LRAD  PT Short Term Goal 3 (Week 1): Pt eill remain standing > 2 minutes to prepare for pregait tasks.  PT Short Term Goal 4 (Week 1): Pt will propel WC >84ft with min assist   Skilled Therapeutic Interventions/Progress Updates:    pt handed off from previous PT, states she is too tired and cannot participate in any more therapy.  Pt propels w/c with min A and max encouragement x 25'.  stedy transfer to bed with min A, mod cuing.  Sit to supine with supervision. Pt left in bed with alarm set, needs at hand, nursing present.  RN aware of decreased participation.  Therapy Documentation Precautions:  Precautions Precautions: Fall, Other (comment) Restrictions Weight Bearing Restrictions: No Pain:  no c/o pain   Therapy/Group: Individual Therapy  , 06/26/2018, 10:15 AM

## 2018-06-26 NOTE — Progress Notes (Signed)
Physical Therapy Session Note  Patient Details  Name: Lori Hardy MRN: [id number] Date of Birth: [birth year]  Today's Date: 06/26/2018 PT Individual Time: 4010-2725 PT Individual Time Calculation (min): 23 min   Short Term Goals: Week 1:  PT Short Term Goal 1 (Week 1): Pt will remain OOB > 2 hours to improve overall endurance  PT Short Term Goal 2 (Week 1): Pt will ambulate 62ft with Mod assist and LRAD  PT Short Term Goal 3 (Week 1): Pt eill remain standing > 2 minutes to prepare for pregait tasks.  PT Short Term Goal 4 (Week 1): Pt will propel WC >30ft with min assist   Skilled Therapeutic Interventions/Progress Updates:  Pt presented hand off from OT attempting to stand/walk. Pt required extensive encouragement to participate in therapy. Pt ultimately refusing to walk however pt able to participate in STS in parallel bars x 3. On first attempt STS pt voluntarily took x 4 steps in bars, pt then proceeds to place head on PTA's shoulder and close eyes. Pt then encouraged to take steps back to w/c as pt appears to want to sit. Pt then stating through interpreter phone that she only wants to go back to bed and go home. PTA explained importance of participation in therapy to increase strength and d/c home safely. Pt non receptive to information. After second stand pt sitting at edge of w/c with pt stating too weak to scoot back in w/c. However pt able to perform 3rd stand so that another therapist can push w/c closer to pt for better positioning upon sitting. Pt then handed off to next therapist for following session.      Therapy Documentation Precautions:  Precautions Precautions: Fall, Other (comment) Restrictions Weight Bearing Restrictions: No General: PT Amount of Missed Time (min): 7 Minutes PT Missed Treatment Reason: Other (Comment) Vital Signs: Therapy Vitals Temp: 97.7 F (36.5 C) Temp Source: Oral Pulse Rate: 81 Resp: 16 BP: (!) 141/61 Patient Position (if appropriate):  Lying Oxygen Therapy SpO2: 100 % O2 Device: Room Air Pain: Pain Assessment Pain Scale: 0-10 Pain Score: 0-No pain Faces Pain Scale: No hurt Therapy/Group: Individual Therapy  Shanique Aslinger  Rustyn Conery, PTA  06/26/2018, 3:36 PM

## 2018-06-26 NOTE — Progress Notes (Signed)
Occupational Therapy Session Note  Patient Details  Name: Lori Hardy MRN: [id number] Date of Birth: 1941/01/10  Today's Date: 06/26/2018 OT Individual Time: 1300-1400 OT Individual Time Calculation (min): 60 min  and Today's Date: 06/26/2018 OT Missed Time: 15 Minutes Missed Time Reason: Patient unwilling/refused to participate without medical reason   Short Term Goals: Week 1:  OT Short Term Goal 1 (Week 1): Pt will transfer wiht MOD A to toilet and LRAD OT Short Term Goal 2 (Week 1): Pt will complete grooming seated at sink to demo improved participation with supervision OT Short Term Goal 3 (Week 1): Pt will complete bathing wiht MOD A OT Short Term Goal 4 (Week 1): Pt will don shirt with MOD A  Skilled Therapeutic Interventions/Progress Updates:    Pt received supine in bed. Interpretor was turned on via iPad and pt requested apple juice- while this was being obtained pt's daughter in law arrived and she interpreted instead per pt request. Attempted to encourage pt in OOB mobility but pt refusing. Extensive education was provided to pt and daughter in law re consequences/risks of remaining in bed, benefits of therapy, and expectations of inpatient rehab participation. Care coordination was completed with CSW re potential options were pt to decide she is done participating all together. Pt and family were given time to talk through some of these decisions, 15 min skilled OT missed. Pt decided to participate. Pt completed SPT to w/c with mod A. She indicated needing to use bathroom only after questioned repeatedly d/t odor and pt c/o stomach pain. Pt had incontinent BM and then transferred to [facility] over toilet and was able to void remaining bowel. Max A for peri hygiene in standing. Pt was transported to therapy gym where she indicated she would like ot address functional mobility. Pt was positioned in parallel bars where she completed 2 sets of ~5 ft forward and backward functional mobility.  Mod A provided throughout with moderate cueing for LE positioning and safety awareness. Discussed plan with pt and dtr in law re plan for tomorrow's session, in which pt came up with the idea to "walk 2x in gym and wash her hair". Pt was returned to her room and left supine in bed with all needs met, bed alarm set.   Therapy Documentation Precautions:  Precautions Precautions: Fall, Other (comment) Restrictions Weight Bearing Restrictions: No General: General OT Amount of Missed Time: 15 Minutes PT Missed Treatment Reason: Other (Comment) Pain: Pain Assessment Pain Scale: Faces Pain Score: 0-No pain Faces Pain Scale: Hurts little more Pain Type: Acute pain Pain Location: Abdomen Pain Orientation: Medial Pain Descriptors / Indicators: Aching Pain Onset: On-going Pain Intervention(s): Repositioned;Ambulation/increased activity   Therapy/Group: Individual Therapy  Curtis Sites 06/26/2018, 4:44 PM

## 2018-06-26 NOTE — Progress Notes (Signed)
Occupational Therapy Session Note  Patient Details  Name: Lori Hardy MRN: [id number] Date of Birth: 07-21-1940  Today's Date: 06/26/2018 OT Individual Time: 3299-2426 OT Individual Time Calculation (min): 60 min   Short Term Goals: Week 1:  OT Short Term Goal 1 (Week 1): Pt will transfer wiht MOD A to toilet and LRAD OT Short Term Goal 2 (Week 1): Pt will complete grooming seated at sink to demo improved participation with supervision OT Short Term Goal 3 (Week 1): Pt will complete bathing wiht MOD A OT Short Term Goal 4 (Week 1): Pt will don shirt with MOD A  Skilled Therapeutic Interventions/Progress Updates:   Pt greeted semi-reclined in bed eating breakfast. Per nurse tech, she had just gotten her in and out of the bathroom using Stedy. Called interpreter on personal phone and had interpreter on speaker phone throughout the session. +2 during session to help encourage pt to participate. With max encouragement, pt agreeable to get OOB and wash up at the sink. 2 trials to come to sitting EOB with min A when she was willing to put in effort. Stand-pivot bed>wc with mod A 2/2 pt reluctant to assist. Pt wanted therapist to wash her and did not want to participate in self care. Eventually able to get pt to wash her upper body and face. Provided pt with shirt, and she again wanted therapist to put her shirt on for her. Utilized backwards chaining and pt then able to thread UE but refused to pull shirt overhead, requiring assistance. Pt refused to put on pants even with OT assist. OT tread R LE into pants for her, then she pulled R LE up to chair and took them off. Pt agreeable to brush teeth at the sink with set-up  A. Sit<>stand at the sink with min A to remove gown from underneath pt. Pt continued to refuse pants, so gown placed over pt in the gym. Pt completed 1 more sit<>stand with 2 therapists and min A, but then refused to ambulated. Pt handoff to PT for next session.  Therapy  Documentation Precautions:  Precautions Precautions: Fall, Other (comment) Restrictions Weight Bearing Restrictions: No Pain:  none denies pain  Therapy/Group: Individual Therapy  Mal Amabile 06/26/2018, 9:52 AM

## 2018-06-26 NOTE — Progress Notes (Addendum)
Patient resting in bed. Daughter in law at bedside earlier able to translate. Patient denies pain. Call light within reach. Translator used frequently during shift. Needs mets. Kalman Shan, LPN

## 2018-06-26 NOTE — Progress Notes (Signed)
[name] PHYSICAL MEDICINE & REHABILITATION PROGRESS NOTE  Subjective/Complaints: Patient seen laying in bed this morning.  She states she slept well overnight.  She is upset that she did not receive her insulin last night.  ROS: Denies CP, shortness of breath, nausea, vomiting, diarrhea.  Objective: Vital Signs: Blood pressure (!) 168/70, pulse 78, temperature 98 F (36.7 C), temperature source Oral, resp. rate 18, height 5\' 4"  (1.626 m), weight 70.6 kg, SpO2 100 %. Dg Abd 1 View  Result Date: 06/25/2018 CLINICAL DATA:  Ileus If the translator machine had worked it would have been helpful but we managed. Pt was able lift her bottom up into the air and roll side to side for detector placment Pt is very sweet EXAM: ABDOMEN - 1 VIEW COMPARISON:  CT abdomen 06/07/2018 FINDINGS: No dilated large or small bowel. There is stool throughout the colon. Stool in the rectum. No intraperitoneal free air. No organomegaly. Postcholecystectomy. IMPRESSION: 1. No acute abdominal findings. 2. Moderate volume stool throughout the colon. 3. Postcholecystectomy. Electronically Signed   By: Genevive Bi M.D.   On: 06/25/2018 15:01   No results for input(s): WBC, HGB, HCT, PLT in the last 72 hours. No results for input(s): NA, K, CL, CO2, GLUCOSE, BUN, CREATININE, CALCIUM in the last 72 hours.  Physical Exam: BP (!) 168/70 (BP Location: Left Arm)   Pulse 78   Temp 98 F (36.7 C) (Oral)   Resp 18   Ht 5\' 4"  (1.626 m)   Wt 70.6 kg   SpO2 100%   BMI 26.72 kg/m  Constitutional: No distress . Vital signs reviewed. HENT: Normocephalic.  Atraumatic. Eyes: EOMI. No discharge. Cardiovascular: RRR. No JVD. Respiratory: CTA Bilaterally. Normal effort. GI: BS +. Non-distended. Musc: No edema or tenderness in extremities. Neuro: Alert Motor: Grossly appears to be 4+/5 throughout, unchanged Skin: Warm and dry.  Intact. Psych: Anxious  Assessment/Plan: 1. Functional deficits secondary to metabolic  encephalopathy which require 3+ hours per day of interdisciplinary therapy in a comprehensive inpatient rehab setting.  Physiatrist is providing close team supervision and 24 hour management of active medical problems listed below.  Physiatrist and rehab team continue to assess barriers to discharge/monitor patient progress toward functional and medical goals  Care Tool:  Bathing    Body parts bathed by patient: Face   Body parts bathed by helper: Right arm, Left arm, Chest, Abdomen, Front perineal area, Buttocks     Bathing assist Assist Level: Maximal Assistance - Patient 24 - 49%     Upper Body Dressing/Undressing Upper body dressing   What is the patient wearing?: Hospital gown only    Upper body assist Assist Level: Minimal Assistance - Patient > 75%    Lower Body Dressing/Undressing Lower body dressing      What is the patient wearing?: Hospital gown only, Incontinence brief     Lower body assist Assist for lower body dressing: Dependent - Patient 0%     Toileting Toileting    Toileting assist Assist for toileting: Dependent - Patient 0%     Transfers Chair/bed transfer  Transfers assist  Chair/bed transfer activity did not occur: Safety/medical concerns  Chair/bed transfer assist level: Moderate Assistance - Patient 50 - 74%     Locomotion Ambulation   Ambulation assist   Ambulation activity did not occur: Safety/medical concerns  Assist level: Moderate Assistance - Patient 50 - 74% Assistive device: Walker-rolling Max distance: 5   Walk 10 feet activity   Assist  Walk 10 feet  activity did not occur: Safety/medical concerns        Walk 50 feet activity   Assist Walk 50 feet with 2 turns activity did not occur: Safety/medical concerns         Walk 150 feet activity   Assist Walk 150 feet activity did not occur: Safety/medical concerns         Walk 10 feet on uneven surface  activity   Assist Walk 10 feet on uneven  surfaces activity did not occur: Safety/medical concerns         Wheelchair     Assist Will patient use wheelchair at discharge?: Yes Type of Wheelchair: Manual    Wheelchair assist level: Minimal Assistance - Patient > 75% Max wheelchair distance: 17ft     Wheelchair 50 feet with 2 turns activity    Assist    Wheelchair 50 feet with 2 turns activity did not occur: Safety/medical concerns       Wheelchair 150 feet activity     Assist Wheelchair 150 feet activity did not occur: Safety/medical concerns          Medical Problem List and Plan: 1.  Decreased functional mobility with debilitation secondary to septic metabolic encephalopathy/urosepsis.   Blood cultures negative.  CSF cultures NGTD  Continue CIR 2.  DVT Prophylaxis/Anticoagulation: subcutaneous Lovenox. Monitor for any bleeding episodes monitor platelets 3. Pain Management:  Tylenol as needed, avoid narcotic analgesics given age and fall risk 4. Mood:  Provide emotional support 5. Neuropsych: This patient is  not capable of making decisions on her own behalf. 6. Skin/Wound Care:  Routine skin checks 7. Fluids/Electrolytes/Nutrition:  Routine in and out's   BMP within acceptable range on 2/14  -Seems to be eating fairly well despite reports from nursing of dry heaves 8. Hypertension. Norvasc 5 mg daily,Cozaar 50 mg daily, Lopressor 25 mg twice a day.   ?  Trending up on 2/17 9. Seizure prophylaxis. Keppra 500 mg twice a day. EEG negative 10. Diabetes mellitus. Hemoglobin A1c 9.7.  Check blood sugars before meals and at bedtime  Lantus insulin increased to to 20 units on 2/17 11. Urosepsis/enterococcus faecalis.   Continue amoxicillin through 2/18 12. Hypothyroidism. Synthroid 13.  Hypokalemia supplement potassium 14.  Hypoalbuminemia  Supplement initiated on 2/14 15.  Anemia of chronic disease  Hemoglobin 9.5 on 2/14  Labs pending 16.  Abdominal distention: Resolved  KUB reviewed, some  stool  Simethicone as needed  LOS: 4 days A FACE TO FACE EVALUATION WAS PERFORMED  Lori Hardy Lori Hardy 06/26/2018, 9:34 AM

## 2018-06-26 NOTE — Progress Notes (Signed)
Social Work Patient ID: Lori Hardy, female   DOB: 03/22/1941, 78 y.o.   MRN: [id number]  Issues continue with lack of onsite interpreter who can speak pt's language as well as pt with very poor participation in therapy sessions.   Contacted pt's daughter-in-law this morning after two failed attempts at tx sessions and asked that she be present for the one at 1:00 with OT.  Dtr-in-law agreeable and was present, however, pt ended session ~ 1/2 way through.  I did discuss with pt, daughter-in-law about the concerns of limited participation as dtr-in-law is stating that she needs pt to reach a supervision level if at all possible.  Stressed to both that we don't have a chance of reaching these goals if pt does not fully participate.  Also, explained that, if family cannot meet care goals, then the only options are to hire further support or consider SNF.    Dtr-in-law then called pt's son and she explained the concerns to him.  On speaker phone, son then spoke with pt and I heard him use the term, "nursing home" several times in their conversation.  At the end of this call, dtr-in-law explained that pt's son has told pt she must complete the rest of the current tx session and that he will be here tomorrow to discuss with her and myself further and make a plan to move forward.  Aileen Amore, LCSW

## 2018-06-26 NOTE — Plan of Care (Signed)
  Problem: Consults Goal: RH GENERAL PATIENT EDUCATION Description See Patient Education module for education specifics. Outcome: Progressing   Problem: RH BLADDER ELIMINATION Goal: RH STG MANAGE BLADDER WITH ASSISTANCE Description STG Manage Bladder With Mod Assistance  Outcome: Progressing   Problem: RH SKIN INTEGRITY Goal: RH STG SKIN FREE OF INFECTION/BREAKDOWN Description No new breakdown with min assist   Outcome: Progressing   Problem: RH SAFETY Goal: RH STG ADHERE TO SAFETY PRECAUTIONS W/ASSISTANCE/DEVICE Description STG Adhere to Safety Precautions With Min Assistance/Device.  Outcome: Progressing   

## 2018-06-27 ENCOUNTER — Inpatient Hospital Stay (HOSPITAL_COMMUNITY): Payer: Medicare Other | Admitting: Physical Therapy

## 2018-06-27 ENCOUNTER — Inpatient Hospital Stay (HOSPITAL_COMMUNITY): Payer: Medicare Other | Admitting: Occupational Therapy

## 2018-06-27 DIAGNOSIS — E119 Type 2 diabetes mellitus without complications: Secondary | ICD-10-CM | POA: Diagnosis not present

## 2018-06-27 DIAGNOSIS — N390 Urinary tract infection, site not specified: Secondary | ICD-10-CM | POA: Diagnosis not present

## 2018-06-27 DIAGNOSIS — R5381 Other malaise: Secondary | ICD-10-CM | POA: Diagnosis not present

## 2018-06-27 DIAGNOSIS — G9341 Metabolic encephalopathy: Secondary | ICD-10-CM | POA: Diagnosis not present

## 2018-06-27 LAB — CBC WITH DIFFERENTIAL/PLATELET
Abs Immature Granulocytes: 0.23 10*3/uL — ABNORMAL HIGH (ref 0.00–0.07)
Basophils Absolute: 0 10*3/uL (ref 0.0–0.1)
Basophils Relative: 0 %
Eosinophils Absolute: 0.1 10*3/uL (ref 0.0–0.5)
Eosinophils Relative: 1 %
HCT: 34.8 % — ABNORMAL LOW (ref 36.0–46.0)
Hemoglobin: 10.6 g/dL — ABNORMAL LOW (ref 12.0–15.0)
Immature Granulocytes: 3 %
LYMPHS ABS: 2.1 10*3/uL (ref 0.7–4.0)
Lymphocytes Relative: 24 %
MCH: 28.5 pg (ref 26.0–34.0)
MCHC: 30.5 g/dL (ref 30.0–36.0)
MCV: 93.5 fL (ref 80.0–100.0)
Monocytes Absolute: 0.9 10*3/uL (ref 0.1–1.0)
Monocytes Relative: 11 %
NRBC: 0 % (ref 0.0–0.2)
Neutro Abs: 5.4 10*3/uL (ref 1.7–7.7)
Neutrophils Relative %: 61 %
Platelets: 226 10*3/uL (ref 150–400)
RBC: 3.72 MIL/uL — ABNORMAL LOW (ref 3.87–5.11)
RDW: 16.5 % — ABNORMAL HIGH (ref 11.5–15.5)
WBC: 8.7 10*3/uL (ref 4.0–10.5)

## 2018-06-27 LAB — GLUCOSE, CAPILLARY
Glucose-Capillary: 181 mg/dL — ABNORMAL HIGH (ref 70–99)
Glucose-Capillary: 292 mg/dL — ABNORMAL HIGH (ref 70–99)

## 2018-06-27 MED ORDER — ROSUVASTATIN CALCIUM 10 MG PO TABS: 10.0000 mg | ORAL_TABLET | Freq: Every day | ORAL | 0 refills | Status: DC

## 2018-06-27 MED ORDER — ESOMEPRAZOLE MAGNESIUM 40 MG PO CPDR: 40.0000 mg | DELAYED_RELEASE_CAPSULE | Freq: Every day | ORAL | 0 refills | Status: DC

## 2018-06-27 MED ORDER — INSULIN GLARGINE 100 UNITS/ML SOLOSTAR PEN: 20.0000 [IU] | PEN_INJECTOR | Freq: Every day | SUBCUTANEOUS | 11 refills | Status: DC

## 2018-06-27 MED ORDER — AMLODIPINE BESYLATE 5 MG PO TABS: 5.0000 mg | ORAL_TABLET | Freq: Every day | ORAL | 1 refills | Status: DC

## 2018-06-27 MED ORDER — METOPROLOL TARTRATE 25 MG PO TABS: 25.0000 mg | ORAL_TABLET | Freq: Two times a day (BID) | ORAL | 0 refills | Status: DC

## 2018-06-27 MED ORDER — LEVOTHYROXINE SODIUM 25 MCG PO TABS: 25.0000 ug | ORAL_TABLET | Freq: Every day | ORAL | 0 refills | Status: DC

## 2018-06-27 MED ORDER — LEVETIRACETAM 500 MG PO TABS: 500.0000 mg | ORAL_TABLET | Freq: Two times a day (BID) | ORAL | 1 refills | Status: DC

## 2018-06-27 MED ORDER — LOSARTAN POTASSIUM 100 MG PO TABS: 50.0000 mg | ORAL_TABLET | Freq: Every day | ORAL | 0 refills | Status: DC

## 2018-06-27 MED ORDER — ACETAMINOPHEN 325 MG PO TABS: 650.0000 mg | ORAL_TABLET | Freq: Four times a day (QID) | ORAL | 0 refills | Status: DC | PRN

## 2018-06-27 NOTE — Progress Notes (Signed)
Physical Therapy Session Note  Patient Details  Name: Lori Hardy MRN: [id number] Date of Birth: 01-27-1941  Today's Date: 06/27/2018 PT Individual Time: 9150-5697 PT Individual Time Calculation (min): 52 min   Short Term Goals: Week 1:  PT Short Term Goal 1 (Week 1): Pt will remain OOB > 2 hours to improve overall endurance  PT Short Term Goal 2 (Week 1): Pt will ambulate 107ft with Mod assist and LRAD  PT Short Term Goal 3 (Week 1): Pt eill remain standing > 2 minutes to prepare for pregait tasks.  PT Short Term Goal 4 (Week 1): Pt will propel WC >56ft with min assist   Skilled Therapeutic Interventions/Progress Updates: Pt presented in w/c at nsg station. Via interpreter phone PTA unable to obtain interpreter throughout session unable DIL arrived. Pt motioning to stomach and occasional making sound similar to dry heave which DIL stated due to pt's complaints of stomach pain not nausea. Through tactile cues pt was able to follow instructions to perform ankle pumps, LAQ, and hip flexion in w/c however required frequent re-direction as pt would stop and close eyes. Attempted to have pt participate in UBE x 2 min, pt was able to complete 1:40 with PTA intermittently providing HOH assist and pt pt stopping at 1:40, PTA then placed pt's hands back on UBE and pointed at timer to have pt complete final 20 secs which pt was able to complete. PTA then transported pt to rehab gym and pt participated in STS and gait in parallel bars x 2. DIL expressed that she would like to take MIL home, advised that will have to be ok'd by MD and that she currently requires more assistance than anticipated due to decreased endurance. Adv would like to have pt participate in stairs as has not been done yet and pt has 3 steps to enter home. With DIL's encouragement pt performed x 4 steps with B rails and minA. Pt non-receptive to attempt stairs with single rail even with PTA placing both hands on rail and demonstrating. Pt  also transported to ortho gym and pt participated in car transfer with RW. Pt was able to transfer into car with CGA but required minA to get out of car from lower height. Pt then transported back to room and ambulated approx 10 ft with verbal cues for hand placement from standing from w/c. Pt ambulated to bed and performed sit to supine from flat bed with supervision. Pt repositioned self in bed and left with bed alarm on, call bell within reach and DIL present. Valentina Gu notified of family decision.      Therapy Documentation Precautions:  Precautions Precautions: Fall, Other (comment) Restrictions Weight Bearing Restrictions: No General: PT Amount of Missed Time (min): 8 Minutes PT Missed Treatment Reason: Patient fatigue;Patient unwilling to participate Vital Signs:  Pain: Pain Assessment Pain Scale: Faces Pain Score: 0-No pain    Therapy/Group: Individual Therapy  Javohn Basey  Marckus Hanover, PTA  06/27/2018, 1:26 PM

## 2018-06-27 NOTE — Progress Notes (Signed)
[place] PHYSICAL MEDICINE & REHABILITATION PROGRESS NOTE  Subjective/Complaints: Patient seen laying in bed this morning.  She states she slept well overnight.  She is still confused and thinks that I am speaking to her in a different language.  ROS: Denies CP, shortness of breath, nausea, vomiting, diarrhea.  Objective: Vital Signs: Blood pressure (!) 148/53, pulse 74, temperature 98 F (36.7 C), temperature source Oral, resp. rate 18, height 5\' 4"  (1.626 m), weight 70.6 kg, SpO2 100 %. Dg Abd 1 View  Result Date: 06/25/2018 CLINICAL DATA:  Ileus If the translator machine had worked it would have been helpful but we managed. Pt was able lift her bottom up into the air and roll side to side for detector placment Pt is very sweet EXAM: ABDOMEN - 1 VIEW COMPARISON:  CT abdomen 06/07/2018 FINDINGS: No dilated large or small bowel. There is stool throughout the colon. Stool in the rectum. No intraperitoneal free air. No organomegaly. Postcholecystectomy. IMPRESSION: 1. No acute abdominal findings. 2. Moderate volume stool throughout the colon. 3. Postcholecystectomy. Electronically Signed   By: Genevive Bi M.D.   On: 06/25/2018 15:01   No results for input(s): WBC, HGB, HCT, PLT in the last 72 hours. No results for input(s): NA, K, CL, CO2, GLUCOSE, BUN, CREATININE, CALCIUM in the last 72 hours.  Physical Exam: BP (!) 148/53 (BP Location: Right Arm)   Pulse 74   Temp 98 F (36.7 C) (Oral)   Resp 18   Ht 5\' 4"  (1.626 m)   Wt 70.6 kg   SpO2 100%   BMI 26.72 kg/m  Constitutional: No distress . Vital signs reviewed. HENT: Normocephalic.  Atraumatic. Eyes: EOMI. No discharge. Cardiovascular: RRR. No JVD. Respiratory: CTA bilaterally. Normal effort. GI: BS +. Non-distended. Musc: No edema or tenderness in extremities. Neuro: Alert Motor: Grossly appears to be 4+/5 throughout, stable Skin: Warm and dry.  Intact. Psych: Anxious  Assessment/Plan: 1. Functional deficits secondary  to metabolic encephalopathy which require 3+ hours per day of interdisciplinary therapy in a comprehensive inpatient rehab setting.  Physiatrist is providing close team supervision and 24 hour management of active medical problems listed below.  Physiatrist and rehab team continue to assess barriers to discharge/monitor patient progress toward functional and medical goals  Care Tool:  Bathing    Body parts bathed by patient: Face   Body parts bathed by helper: Right arm, Left arm, Chest, Abdomen, Front perineal area, Buttocks     Bathing assist Assist Level: Maximal Assistance - Patient 24 - 49%     Upper Body Dressing/Undressing Upper body dressing   What is the patient wearing?: Hospital gown only    Upper body assist Assist Level: Minimal Assistance - Patient > 75%    Lower Body Dressing/Undressing Lower body dressing      What is the patient wearing?: Hospital gown only, Incontinence brief     Lower body assist Assist for lower body dressing: Dependent - Patient 0%     Toileting Toileting    Toileting assist Assist for toileting: Dependent - Patient 0%     Transfers Chair/bed transfer  Transfers assist     Chair/bed transfer assist level: Moderate Assistance - Patient 50 - 74%     Locomotion Ambulation   Ambulation assist   Ambulation activity did not occur: Safety/medical concerns  Assist level: Moderate Assistance - Patient 50 - 74% Assistive device: Walker-rolling Max distance: 5   Walk 10 feet activity   Assist  Walk 10 feet activity did  not occur: Safety/medical concerns        Walk 50 feet activity   Assist Walk 50 feet with 2 turns activity did not occur: Safety/medical concerns         Walk 150 feet activity   Assist Walk 150 feet activity did not occur: Safety/medical concerns         Walk 10 feet on uneven surface  activity   Assist Walk 10 feet on uneven surfaces activity did not occur: Safety/medical  concerns         Wheelchair     Assist Will patient use wheelchair at discharge?: Yes Type of Wheelchair: Manual    Wheelchair assist level: Minimal Assistance - Patient > 75% Max wheelchair distance: 25    Wheelchair 50 feet with 2 turns activity    Assist    Wheelchair 50 feet with 2 turns activity did not occur: Safety/medical concerns       Wheelchair 150 feet activity     Assist Wheelchair 150 feet activity did not occur: Safety/medical concerns          Medical Problem List and Plan: 1.  Decreased functional mobility with debilitation secondary to septic metabolic encephalopathy/urosepsis.   Blood cultures negative.  CSF cultures no growth  Continue CIR 2.  DVT Prophylaxis/Anticoagulation: subcutaneous Lovenox. Monitor for any bleeding episodes monitor platelets 3. Pain Management:  Tylenol as needed, avoid narcotic analgesics given age and fall risk 4. Mood:  Provide emotional support 5. Neuropsych: This patient is  not capable of making decisions on her own behalf. 6. Skin/Wound Care:  Routine skin checks 7. Fluids/Electrolytes/Nutrition:  Routine in and out's   BMP within acceptable range on 2/14 8. Hypertension. Norvasc 5 mg daily,Cozaar 50 mg daily, Lopressor 25 mg twice a day.   Labile, but improving on 2/18 9. Seizure prophylaxis. Keppra 500 mg twice a day. EEG negative 10. Diabetes mellitus. Hemoglobin A1c 9.7.  Check blood sugars before meals and at bedtime  Lantus insulin increased to to 20 units on 2/17  Remains elevated, will consider further increase tomorrow 11. Urosepsis/enterococcus faecalis.   Continue amoxicillin through today 12. Hypothyroidism. Synthroid 13.  Hypokalemia supplement potassium 14.  Hypoalbuminemia  Supplement initiated on 2/14 15.  Anemia of chronic disease  Hemoglobin 9.5 on 2/14  Labs pending, ordered yesterday, not drawn-we will follow-up 16.  Abdominal distention: Resolved  KUB reviewed, some  stool  Simethicone as needed  LOS: 5 days A FACE TO FACE EVALUATION WAS PERFORMED  Hayzel Ruberg Dinisha Weyrauch 06/27/2018, 8:53 AM

## 2018-06-27 NOTE — Discharge Instructions (Signed)
Inpatient Rehab Discharge Instructions  Lori Hardy Discharge date and time: No discharge date for patient encounter.   Activities/Precautions/ Functional Status: Activity: activity as tolerated Diet: diabetic diet Wound Care: none needed Functional status:  ___ No restrictions     ___ Walk up steps independently ___ 24/7 supervision/assistance   ___ Walk up steps with assistance ___ Intermittent supervision/assistance  ___ Bathe/dress independently ___ Walk with walker     _x__ Bathe/dress with assistance ___ Walk Independently    ___ Shower independently ___ Walk with assistance    ___ Shower with assistance ___ No alcohol     ___ Return to work/school ________   COMMUNITY REFERRALS UPON DISCHARGE:    Home Health:   PT     OT                          Agency:  [facility] Phone:  [phone]       Special Instructions:    My questions have been answered and I understand these instructions. I will adhere to these goals and the provided educational materials after my discharge from the hospital.  Patient/Caregiver Signature _______________________________ Date __________  Clinician Signature _______________________________________ Date __________  Please bring this form and your medication list with you to all your follow-up doctor's appointments.

## 2018-06-27 NOTE — Progress Notes (Signed)
Social Work Patient ID: Lori Hardy, female   DOB: 1940/07/13, 78 y.o.   MRN: [id number]   Alerted by RN this morning that pt's daughter-in-law requesting that pt be d/c'd today.  Spoke with pt, dtr-in-law and son (via phone) to confirm their wishes.  Alerted MD and PA who are agreeable with d/c if family agrees to monitor BSs and provide 24/7 assistance due to pt's continued confusion with her encephalopathy.  Family agreed to this plan.  Son asks that [facility] therapies be arranged - done.  He will contact [facility] aide agency to restart [facility] aide services.  Stressed to all that pt should not be left alone and son states he understands this and wish to proceed with d/c today.  Asked that dtr-in-law stay for the 1:00 pm PT session just to review care needs, however, son states that they "will manage" at home and that he needs his mother d/c'd now so dtr-in-law is able to pick up children from school.  Team aware of plans.  Teddrick Mallari, LCSW

## 2018-06-27 NOTE — Progress Notes (Signed)
Late entry:  Patient and family prefer discharge home.  Please see d/c summary as well.  Will see patient for transitional care management in 1-2 weeks.

## 2018-06-27 NOTE — Progress Notes (Signed)
Social Work  Discharge Note  The overall goal for the admission was met for:   Discharge location: Yes - home with family   Length of Stay: No - family insists on d/c today.  LOS = 5 days (team had ELOS of 14-18 days)  Discharge activity level: No - pt d/c'd at min assist level overall, however, goals had been set for supervision - min assist  Home/community participation: No  Services provided included: MD, RD, PT, OT, RN, Pharmacy and SW  Financial Services: Medicare and Medicaid  Follow-up services arranged: Home Health: PT, OT via [place] and Patient/Family request agency HH: [facility], DME: NA - has needed DME already  Comments (or additional information):  Contact info:    Son, Valetta Mulroy @ (C) [phone] Dtr-in-law, Dorothea Ogle @ [phone]  Patient/Family verbalized understanding of follow-up arrangements: Yes  Individual responsible for coordination of the follow-up plan: son  Confirmed correct DME delivered: NA    [place]

## 2018-06-27 NOTE — Progress Notes (Signed)
Physical Therapy Discharge Summary  Patient Details  Name: Lori Hardy MRN: [id number] Date of Birth: 1941-02-17  Today's Date: 06/27/2018    Patient has met 2 of 9 long term goals due to pt's family requesting to take pt home at this time. Pt was able to intermittent participate in activities however was limited due to language barrier and inconsistent participation during therapies. Pt's dgt in law was present for this therapist's final session with pt and was able to observe required increased assistance with stairs (MinA) and decreased safety with transfers.  Patient to discharge at an ambulatory level [place].   Patient's care partner states will be able to care for pt with transfers and stairs. Per dgt in law brings w/c to vehicle and transports pt to stairs. Per dgt in law pt primarly spends time in bed and performs transfers to [facility] when needed.  to provide the necessary physical assistance at discharge.  Reasons goals not met: Pt's family requesting to take pt home at this time prior to anticipated d/c date (see details above).   Recommendation:  Patient will benefit from ongoing skilled PT services in home health setting to continue to advance safe functional mobility, address ongoing impairments in balance, endurance, strength, gait, transfers, and minimize fall risk.  Equipment: No equipment provided  Reasons for discharge: discharge from hospital  Patient/family agrees with progress made and goals achieved: Yes  PT Discharge Precautions/Restrictions Precautions Precautions: Fall;Other (comment) Restrictions Weight Bearing Restrictions: No Pain Pain Assessment Pain Scale: 0-10 Faces Pain Scale: Hurts a little bit Pain Location: Abdomen Vision/Perception  Perception Perception: Within Functional Limits Praxis Praxis: Intact  Cognition Overall Cognitive Status: Difficult to assess Arousal/Alertness: Awake/alert Orientation Level: Oriented to person;Oriented to  place;Oriented to situation Attention: Alternating;Selective Alternating Attention: Impaired Problem Solving: Impaired Safety/Judgment: Impaired Sensation Coordination Gross Motor Movements are Fluid and Coordinated: No Fine Motor Movements are Fluid and Coordinated: No Motor  Motor Motor: Motor impersistence;Other (comment)  Mobility Bed Mobility Bed Mobility: Supine to Sit Sit to Supine: Supervision/Verbal cueing Transfers Transfers: Sit to Stand;Stand Pivot Transfers Sit to Stand: Contact Guard/Touching assist Stand to Sit: Contact Guard/Touching assist Stand Pivot Transfers: Contact Guard/Touching assist Stand Pivot Transfer Details: Tactile cues for sequencing Transfer (Assistive device): Rolling walker Locomotion  Gait Ambulation: Yes Gait Assistance: Contact Guard/Touching assist Gait Distance (Feet): 10 Feet Assistive device: Rolling walker Gait Gait: Yes Gait Pattern: Impaired Gait Pattern: Antalgic;Wide base of support;Decreased step length - right;Decreased step length - left;Trunk flexed Gait velocity: reduced Stairs / Additional Locomotion Stairs: Yes Stairs Assistance: Minimal Assistance - Patient > 75% Stair Management Technique: Two rails Number of Stairs: 4 Height of Stairs: 6 Wheelchair Mobility Wheelchair Mobility: No  Trunk/Postural Assessment  Cervical Assessment Cervical Assessment: Exceptions to WFL(forward head) Thoracic Assessment Thoracic Assessment: Exceptions to WFL(rounded shoulders) Lumbar Assessment Lumbar Assessment: Exceptions to WFL(posterior pelvic tilt) Postural Control Postural Control: Deficits on evaluation  Balance Balance Balance Assessed: Yes Dynamic Sitting Balance Dynamic Sitting - Level of Assistance: 5: Stand by assistance Static Standing Balance Static Standing - Level of Assistance: 5: Stand by assistance Extremity Assessment      RLE Assessment RLE Assessment: Not tested LLE Assessment LLE Assessment: Not  tested    Rosita DeChalus 06/27/2018, 4:01 PM

## 2018-06-27 NOTE — Discharge Summary (Signed)
Discharge summary job # [id number]

## 2018-06-27 NOTE — Discharge Summary (Addendum)
Lori Hardy, SALASAR MEDICAL RECORD HW:[id number] ACCOUNT [id number] DATE OF BIRTH:[birth year] FACILITY: [facility] LOCATION: [facility] PHYSICIAN:Roseland Braun Lazard, MD  DISCHARGE SUMMARY  DATE OF DISCHARGE:  06/27/2018  ADMIT DATE:  06/23/2018    DISCHARGE DATE:  06/27/2018   DISCHARGE DIAGNOSES: 1.  Debilitation secondary to septic metabolic encephalopathy with urosepsis. 2.  Subcutaneous Lovenox for deep venous thrombosis prophylaxis. 3.  Pain management. 4.  Seizure prophylaxis.   5.  Diabetes mellitus. 6.  Hypothyroidism. 7.  Hypokalemia, resolved. 8.  Anemia of chronic disease. 9.  Constipation.  HOSPITAL COURSE: 1.  This is a 78 year old right-handed limited English-speaking female with history of diabetes mellitus, hyperlipidemia, anemia, chronic kidney disease stage II.  Lives with son and family.  Used a walker prior to admission.  She has a home health aide  2 hours a day as well as a daughter-in-law who assists.  presented to 06/18/2018 with complaints of headache, altered mental status, vomiting, fever of 104, lactic acid 3.5.  Urine study greater than 100,000 Enterococcus.  Cranial CT scan negative.   Noted small remote lacunar infarction posteriorly.  MRI negative for acute changes.  CT angiogram of head and neck with no dissection or aneurysm.  Lumbar puncture unremarkable.  Ultrasound of the abdomen negative.  Blood cultures no growth.  Maintained  on Rocephin x7 days.  Changed to oral antibiotics.  Keppra for seizure prophylaxis.  Neurology followup.  Suspect septic metabolic encephalopathy.  HOSPITAL COURSE: Close monitoring of blood pressure.  Subcutaneous heparin for DVT prophylaxis.  Therapies initiated.  The patient was admitted for a comprehensive rehabilitation program.  PAST MEDICAL HISTORY:  See discharge diagnoses.  SOCIAL HISTORY:  Lives with family.  Used a walker.  She has a home health aide.  FUNCTIONAL STATUS:  Upon admission to rehab services was ambulating 12  feet handheld assist, moderate assist sit to stand, mod/max assist with ADLs.  PHYSICAL EXAMINATION: VITAL SIGNS:  Blood pressure 114/85, pulse 95, temperature 98, respirations 22. GENERAL:  Alert female, limited English speaking.  Limited exam. HEENT:  EOMs intact. NECK:  Supple, nontender, no JVD. CARDIOVASCULAR:  Rate controlled. ABDOMEN:  Soft, nontender, good bowel sounds. LUNGS:  Clear to auscultation without wheeze.  REHABILITATION HOSPITAL COURSE:  The patient was admitted to inpatient rehabilitation services.  Therapies initiated on a 3-hour daily basis, consisting of physical therapy, occupational therapy and rehabilitation nursing.  The following issues were  addressed.  Pertaining to the patient's septic metabolic encephalopathy, she had completed a course of antibiotic care.  CSF culture is no growth.  She would follow up ambulatory referral to neurology services.  Subcutaneous Lovenox for DVT prophylaxis.   No bleeding episodes.  Blood pressure is controlled with Norvasc, Cozaar, Lopressor.  She would follow up with her primary MD.  Complete course of Keppra for seizure prophylaxis.  EEG negative.  Diabetes mellitus.  Hemoglobin A1c 9.7.  Blood sugars had  some elevated variables on Lantus insulin.  Full diabetic teaching with family.  Continue on hormone supplement for hypothyroidism.  Anemia of chronic disease 9.5.  Bouts of abdominal discomfort.  KUB showed some stool.  No obstruction felt to be  possibly related to antibiotic.  Bowel program regulated.  The patient received weekly collaborative interdisciplinary team conferences to discuss estimated length of stay, family teaching, any barriers to discharge.  She was ambulating short distances  assistive device, min assist to propel her wheelchair.  Steady transfers to bed with minimal assistance.  Activities of daily living and homemaking with assistance  of family.  Interpreter noted.  Max encouragement at times to participate.   Ongoing talks  held with family.  They felt that she will be able to participate better at home with home health therapies as well as ongoing need of a home health aide.  Discharge taking place on 06/27/2018.  DISCHARGE MEDICATIONS:  Included Norvasc 5 mg p.o. daily, Trusopt ophthalmic solution, Lantus insulin 20 units daily, Xalatan ophthalmic solution 1 drop both eyes at bedtime, Keppra 500 mg p.o. b.i.d., Synthroid 25 mcg p.o. daily, Cozaar 50 mg p.o.  daily, Lopressor 25 mg p.o. b.i.d., multivitamin daily, Protonix 40 mg p.o. daily, Crestor 10 mg p.o. at bedtime, Timoptic ophthalmic solution 1 drop right eye daily, Tylenol as needed.  DIET:  Regular consistency.    FOLLOWUP:  She would follow up with Dr. Maryla Morrow at the outpatient rehab service office as advised; Dr. Lorella Nimrod management.  Ambulatory referral to neurology services.  SPECIAL INSTRUCTIONS:  Continue 24-hour assistance for patient safety.  TN/NUANCE D:06/27/2018 T:06/27/2018 JOB:[id number] Patient seen and examined by me on day of discharge. Maryla Morrow, MD, ABPMR

## 2018-06-27 NOTE — Progress Notes (Signed)
Patient was discharged and left with daughter inlaw. Patient left with all personal belongings and all questions were answered. Reuben Likes, LPN

## 2018-06-27 NOTE — Progress Notes (Addendum)
Occupational Therapy Discharge Summary  Patient Details  Name: Lori Hardy MRN: [id number] Date of Birth: 1941/03/01  Today's Date: 06/27/2018 OT Individual Time: [phone] OT Individual Time Calculation (min): 75 min    Session Note:  Pt in bed to start session, agreeable to participating in therapy.  She transferred to the EOB with supervision.  Once on the EOB attempted to have pt work on donning pants.  Initially, she would not attempt, but through interpreter and mod assist she finally assisted.  She reports having assistance with bathing and dressing at home.  Pt needed max cueing for pulling pants over hips in standing, including three attempts to do so.  When she would stand with min assist she would then attempt to transfer to the wheelchair and not pull the pants over her hips.  On the third attempt, she finally got them up with mod assist.  She then transferred to the wheelchair with min assist.  Max demonstrational cueing for hand placement during sit to stand.  Therapist took pt down to the dayroom where she agreed to work on functional mobility.  She was able to ambulate with min assist using the RW for 6 intervals of 12-13' with rest between sessions.  Max demonstrational cueing for turning around and squaring up to the chair before sitting.  No matter how much visual and verbal cueing was given she would not comply and sat down before getting turned around with both hands on the RW.  When attempting to explain through the interpreter the safety techniques, she would not pay attention to the therapist, but instead looked away from him.  Finished session with return to the room with pt having 15 mins before next session.  Encouraged pt to stay up in the chair, which she initially agreed to do, but then when therapist was getting ready to leave, she would point to the bed and say she wanted to lay down.  In order to keep her up and supervised therapist took her to the nurses station.     Patient has met 1 of 9 long term goals due to improved balance.  Patient to discharge at overall Mod Assist level.  Patient's care partner is independent to provide the necessary physical and cognitive assistance at discharge.    Reasons goals not met: Pt did not actively participate in many OT sessions and family decided to take her home before expected LOS.  Because of this most LTGs were not achieved.    Recommendation:  Patient will benefit from ongoing skilled OT services in home health setting to continue to advance functional skills in the area of BADL.  Pt will benefit from [facility] for further progress if she is willing to participate.  Feel that she is mostly self limiting and is happy for others to help her at this time.    Equipment: No equipment provided  Reasons for discharge: discharge from hospital  Patient/family agrees with progress made and goals achieved: Yes  OT Discharge Precautions/Restrictions  Precautions Precautions: Fall;Other (comment) Restrictions Weight Bearing Restrictions: No  Pain Pain Assessment Pain Scale: 0-10 Faces Pain Scale: Hurts a little bit Pain Location: Abdomen ADL ADL Eating: Supervision/safety Where Assessed-Eating: Wheelchair Grooming: Minimal assistance Where Assessed-Grooming: Wheelchair Upper Body Bathing: Moderate assistance Where Assessed-Upper Body Bathing: Wheelchair Lower Body Bathing: Maximal assistance Where Assessed-Lower Body Bathing: Wheelchair Upper Body Dressing: Moderate assistance Where Assessed-Upper Body Dressing: Wheelchair Lower Body Dressing: Maximal assistance Where Assessed-Lower Body Dressing: Wheelchair Toileting: Maximal assistance Toilet Transfer: Minimal  assistance Toilet Transfer Method: Counselling psychologist: Engineer, technical sales Transfer: Not assessed ADL Comments: Difficult to accurately assess ADL status secondary to pt's self limiting actions.   Vision Baseline  Vision/History: No visual deficits Vision Assessment?: No apparent visual deficits Perception  Perception: Within Functional Limits Praxis Praxis: Intact Cognition Overall Cognitive Status: Difficult to assess Arousal/Alertness: Awake/alert Orientation Level: Oriented to person;Oriented to place;Oriented to situation Attention: Sustained;Alternating Selective Attention: Impaired Alternating Attention: Impaired Memory: Impaired Problem Solving: Impaired Safety/Judgment: Impaired Comments: Pt with decreased awareness of the need to participate in therapy as well as demonstrating decreased carryover of techniques from session to session.  Sensation Sensation Light Touch: Appears Intact Proprioception: Appears Intact Coordination Gross Motor Movements are Fluid and Coordinated: No Fine Motor Movements are Fluid and Coordinated: No Motor  Motor Motor: Motor impersistence;Other (comment) Mobility  Bed Mobility Bed Mobility: Supine to Sit Supine to Sit: Supervision/Verbal cueing Sit to Supine: Supervision/Verbal cueing Transfers Sit to Stand: Minimal Assistance - Patient > 75% Stand to Sit: Minimal Assistance - Patient > 75%  Trunk/Postural Assessment  Cervical Assessment Cervical Assessment: Exceptions to WFL(forward head) Thoracic Assessment Thoracic Assessment: Exceptions to WFL(rounded shoulders) Lumbar Assessment Lumbar Assessment: Exceptions to WFL(posterior pelvic tilt) Postural Control Postural Control: Deficits on evaluation  Balance Balance Balance Assessed: Yes Dynamic Sitting Balance Dynamic Sitting - Level of Assistance: 5: Stand by assistance Static Standing Balance Static Standing - Balance Support: During functional activity Static Standing - Level of Assistance: 4: Min assist Extremity/Trunk Assessment RUE Assessment RUE Assessment: Exceptions to [facility] General Strength Comments: generalized weakness LUE Assessment LUE Assessment: Exceptions to  [facility] General Strength Comments: generalized weakness   Ryanne Morand OTR/L 06/27/2018, 4:53 PM

## 2018-06-28 ENCOUNTER — Telehealth: Payer: Self-pay

## 2018-06-28 ENCOUNTER — Inpatient Hospital Stay (HOSPITAL_COMMUNITY): Payer: Medicare Other | Admitting: Occupational Therapy

## 2018-06-28 NOTE — Telephone Encounter (Signed)
Transitional Care call  Patient name: Stauter Patlel) DOB: (Mar 28, 1941) 1. Are you/is patient experiencing any problems since coming home? (NO) a. Are there any questions regarding any aspect of care? (NA) 2. Are there any questions regarding medications administration/dosing? (NO) a. Are meds being taken as prescribed? (YES) b. "Patient should review meds with caller to confirm"  3. Have there been any falls? (NO) 4. Has Home Health been to the house and/or have they contacted you? (YES) a. If not, have you tried to contact them? (NA) b. Can we help you contact them? (NA) 5. Are bowels and bladder emptying properly? (NO) a. Are there any unexpected incontinence issues? (NA) b. If applicable, is patient following bowel/bladder programs? (NA) 6. Any fevers, problems with breathing, unexpected pain? (NO) 7. Are there any skin problems or new areas of breakdown? (NO) 8. Has the patient/family member arranged specialty MD follow up (ie cardiology/neurology/renal/surgical/etc.)?  (YES) a. Can we help arrange? (NA) 9. Does the patient need any other services or support that we can help arrange? (NO) 10. Are caregivers following through as expected in assisting the patient? (YES) 11. Has the patient quit smoking, drinking alcohol, or using drugs as recommended? (NA)  Appointment date/time (07-06-2018 / 920am), arrive time (900am) and who it is with here (Dr. Cherre Huger) [address]

## 2018-06-28 NOTE — Patient Care Conference (Cosign Needed)
Inpatient RehabilitationTeam Conference and Plan of Care Update Date: 06/27/2018   Time: 11:00 am    Patient Name: Lori Hardy      Medical Record Number: [id number]  Date of Birth: May 28, 1940 Sex: Female         Room/Bed: 4M08C/4M08C-01 Payor Info: Payor: MEDICARE / Plan: MEDICARE PART B / Product Type: *No Product type* /    Admitting Diagnosis: metabolic encephalopathy  Admit Date/Time:  06/22/2018  6:24 PM Admission Comments: No comment available   Primary Diagnosis:  <principal problem not specified> Principal Problem: <principal problem not specified>  Patient Active Problem List   Diagnosis Date Noted  . Acute blood loss anemia   . Acute lower UTI   . Hypoalbuminemia due to protein-calorie malnutrition (HCC)   . Diabetes mellitus type 2 in nonobese (HCC)   . Metabolic encephalopathy 06/22/2018  . Encephalopathy   . Acute pyelonephritis   . Debility   . Anemia of chronic disease   . CKD (chronic kidney disease), stage II   . Hypertensive crisis   . Sepsis (HCC) 06/19/2018  . AMS (altered mental status) 06/18/2018  . Weight loss 06/28/2017  . Persistent proteinuria 06/28/2017  . Anemia due to stage 3 chronic kidney disease (HCC) 03/23/2017  . Positive ANA (antinuclear antibody) 02/15/2017  . Posterior capsular opacification of right eye, obscuring vision 12/29/2016  . No advance directive on file 03/16/2016  . Need for hepatitis B vaccination 02/03/2016  . Microalbuminuria due to type 2 diabetes mellitus (HCC) 01/20/2016  . Language barrier to communication 12/18/2015  . Night sweats 11/20/2015  . Acquired hypothyroidism 10/02/2015  . Polyneuropathy due to type 2 diabetes mellitus (HCC) 09/26/2015  . Migraine without aura and without status migrainosus, not intractable 09/26/2015  . Diabetes mellitus (HCC) 08/13/2015  . Gastro-esophageal reflux disease without esophagitis 08/13/2015  . Hyperlipidemia 08/13/2015  . Hypertension 08/13/2015  . 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 Unspecified cirrhosis of liver (HCC) 08/13/2015  . Hyperlipidemia associated with type 2 diabetes mellitus (HCC) 06/26/2015  . Primary open-angle glaucoma 04/14/2015    Expected Discharge Date: Expected Discharge Date: 06/27/18  Team Members Present: Physician leading conference: Dr. Maryla Morrow Social Worker Present: Amada Jupiter, LCSW Nurse Present: Briscoe Deutscher, LPN PT Present: Harless Litten, PTA OT Present: Perrin Maltese, OT     Current Status/Progress Goal Weekly Team Focus  Medical   Decreased functional mobility with debilitation secondary to septic metabolic encephalopathy/urosepsis.   Improve mobility, transfers, cognition, CBGs, UTI  See above   Bowel/Bladder   continent of Bowel and bladder  to remain continent of bowel and bladder  to assess every 2 hours   Swallow/Nutrition/ Hydration             ADL's   Pt currently needs max instructional cueing for participation and is self limiting.  She needs min assist for UB bathing with mod assist for LB bathing.  Min assist for UB dressing as well with max assist for LB dressing.  Min assist for stand pivot transfers with use of the RW for support.    supervision to min assist level   selfcare retraining, balance retraining, transfer training, pt/family education   Mobility   supervision sit to supine, CGA STS from elevated surfaces, minA from lower surfaces, gait with RW approx 10 ft CGA, minA x 4 steps with B rails, CGA to get into car, minA from out of car, decreased motivation to participate in therapy  supervision overall  gait, transfers, endurace, BLE strengthening,  family edu   Communication             Safety/Cognition/ Behavioral Observations            Pain   none  N/A  N/A   Skin   clear, dry and intact  to keep skin free of any issues  to assess every shift      *See Care Plan and progress notes for long and short-term goals.     Barriers to Discharge  Current Status/Progress Possible  Resolutions Date Resolved   Physician    Medical stability     See above  Therapies, optimize DM/HTN meds, complete abx      Nursing                  PT                    OT                  SLP                SW                Discharge Planning/Teaching Needs:  Plan to d/c home with son and daughter-in-law providing 24/7 care.  Training ongoing.   Team Discussion:  Pt has reached min assist LOF and family wishes to take pt home today.  Family ed complete.  Revisions to Treatment Plan:  NA    Continued Need for Acute Rehabilitation Level of Care: The patient requires daily medical management by a physician with specialized training in physical medicine and rehabilitation for the following conditions: Daily direction of a multidisciplinary physical rehabilitation program to ensure safe treatment while eliciting the highest outcome that is of practical value to the patient.: Yes Daily medical management of patient stability for increased activity during participation in an intensive rehabilitation regime.: Yes Daily analysis of laboratory values and/or radiology reports with any subsequent need for medication adjustment of medical intervention for : Diabetes problems;Blood pressure problems;Mood/behavior problems;Urological problems;Other   I attest that I was present, lead the team conference, and concur with the assessment and plan of the team.   Tomesha Sargent 06/28/2018, 3:05 PM

## 2018-07-03 ENCOUNTER — Telehealth: Payer: Self-pay

## 2018-07-03 NOTE — Telephone Encounter (Signed)
Thayer Ohm, PT/ADVHC called requesting verbal orders for HHPT 1wk1, 2wk2, 1wk2 and which provider is signing off POC. Order approved and given with provider as well.

## 2018-07-04 ENCOUNTER — Telehealth: Payer: Self-pay | Admitting: *Deleted

## 2018-07-04 NOTE — Telephone Encounter (Signed)
Brandon OT [facility] called for POC of 2wk4.  Approval given.

## 2018-07-06 ENCOUNTER — Encounter: Payer: Medicare Other | Attending: Physical Medicine & Rehabilitation | Admitting: Physical Medicine & Rehabilitation

## 2018-07-06 ENCOUNTER — Encounter: Payer: Medicare Other | Admitting: Physical Medicine & Rehabilitation

## 2018-07-12 ENCOUNTER — Encounter: Payer: Self-pay | Admitting: Physical Medicine & Rehabilitation

## 2018-07-12 ENCOUNTER — Encounter: Payer: Medicare Other | Attending: Physical Medicine & Rehabilitation | Admitting: Physical Medicine & Rehabilitation

## 2018-07-12 VITALS — BP 128/85 | HR 80 | Ht 63.0 in | Wt 150.0 lb

## 2018-07-12 DIAGNOSIS — Z298 Encounter for other specified prophylactic measures: Secondary | ICD-10-CM | POA: Insufficient documentation

## 2018-07-12 DIAGNOSIS — R5381 Other malaise: Secondary | ICD-10-CM | POA: Insufficient documentation

## 2018-07-12 DIAGNOSIS — A419 Sepsis, unspecified organism: Secondary | ICD-10-CM | POA: Diagnosis not present

## 2018-07-12 DIAGNOSIS — E1136 Type 2 diabetes mellitus with diabetic cataract: Secondary | ICD-10-CM | POA: Insufficient documentation

## 2018-07-12 DIAGNOSIS — E1122 Type 2 diabetes mellitus with diabetic chronic kidney disease: Secondary | ICD-10-CM | POA: Diagnosis not present

## 2018-07-12 DIAGNOSIS — I10 Essential (primary) hypertension: Secondary | ICD-10-CM | POA: Diagnosis not present

## 2018-07-12 DIAGNOSIS — R269 Unspecified abnormalities of gait and mobility: Secondary | ICD-10-CM | POA: Insufficient documentation

## 2018-07-12 DIAGNOSIS — Z794 Long term (current) use of insulin: Secondary | ICD-10-CM | POA: Diagnosis not present

## 2018-07-12 DIAGNOSIS — N182 Chronic kidney disease, stage 2 (mild): Secondary | ICD-10-CM | POA: Diagnosis not present

## 2018-07-12 DIAGNOSIS — I129 Hypertensive chronic kidney disease with stage 1 through stage 4 chronic kidney disease, or unspecified chronic kidney disease: Secondary | ICD-10-CM | POA: Diagnosis not present

## 2018-07-12 DIAGNOSIS — G9341 Metabolic encephalopathy: Secondary | ICD-10-CM | POA: Diagnosis not present

## 2018-07-12 NOTE — Progress Notes (Signed)
Subjective:    Patient ID: Lori Hardy, female    DOB: 12/04/1940, 78 y.o.   MRN: [id number]  HPI 78 year old right-handed limited English-speaking female with history of diabetes mellitus, hyperlipidemia, anemia, chronic kidney disease stage II presents for hospital follow up for metabolic encephalopathy.    DATE OF DISCHARGE:  06/27/2018 ADMIT DATE:  06/23/2018    Daughter in law provides history.  At discharge, she was instructed to follow up with PCP, which she has not done.  Denies falls.   Therapies: 2/week DME: Not required Mobility: Wheelchair and walker  BP is controlled. Mentation has returned to baseline per family. She is taking Keppra daily because pt states it causes abdominal discomfort. CBGs have been controlled.  Pain Inventory Average Pain 4 Pain Right Now 0 My pain is dull  In the last 24 hours, has pain interfered with the following? General activity 4 Relation with others 4 Enjoyment of life 4 What TIME of day is your pain at its worst? morning Sleep (in general) Fair  Pain is worse with: sitting Pain improves with: therapy/exercise Relief from Meds: 6  Mobility walk with assistance use a walker ability to climb steps?  yes do you drive?  no use a wheelchair needs help with transfers  Function retired I need assistance with the following:  dressing, bathing, toileting, meal prep, household duties and shopping  Neuro/Psych weakness trouble walking dizziness  Prior Studies Any changes since last visit?  no  Physicians involved in your care Any changes since last visit?  no   No family history on file. Social History   Socioeconomic History  . Marital status: Widowed    Spouse name: Not on file  . Number of children: Not on file  . Years of education: Not on file  . Highest education level: Not on file  Occupational History  . Not on file  Social Needs  . Financial resource strain: Not on file  . Food insecurity:    Worry:  Not on file    Inability: Not on file  . Transportation needs:    Medical: Not on file    Non-medical: Not on file  Tobacco Use  . Smoking status: Never Smoker  . Smokeless tobacco: Never Used  Substance and Sexual Activity  . Alcohol use: Not on file  . Drug use: Not on file  . Sexual activity: Not on file  Lifestyle  . Physical activity:    Days per week: Not on file    Minutes per session: Not on file  . Stress: Not on file  Relationships  . Social connections:    Talks on phone: Not on file    Gets together: Not on file    Attends religious service: Not on file    Active member of club or organization: Not on file    Attends meetings of clubs or organizations: Not on file    Relationship status: Not on file  Other Topics Concern  . Not on file  Social History Narrative   Daughter in law, son, two grandkids, 68 & 62 years old.    Daily care giver [facility], sees patient daily for 2 hours.    Past Surgical History:  Procedure Laterality Date  . cataracts     Past Medical History:  Diagnosis Date  . Aortic atherosclerosis (HCC) 12/15/2015   Overview:  By imaging, xray 11/2015.  . Closed fracture of right proximal humerus 04/06/2017  . Compression fracture of L4 lumbar vertebra,  closed, initial encounter (HCC) 01/12/2017  . Diabetes mellitus without complication (HCC)   . Exposure to TB 09/26/2015  . 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  Differential includes diet, medications, possible pancreatic insufficiency.  Negative C. difficile 09/27/2015  . H. pylori infection 10/07/2016   Overview:  BX confirmed. 09/15/2016- Dr. Noe Gens, [facility].   Marland Kitchen History of compression fracture of spine 11/20/2015   Overview:  T12, on prolia.  Marland Kitchen History of Helicobacter pylori infection 11/20/2015   Overview:  By EGD 2017.  Treated with 2 courses of antibiotics at [facility].  . Hx  of pancreatitis 09/26/2015   Overview:  08/2015 [facility]  . Hypertension   . Hyponatremia 04/18/2017  . 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 No active disease on chest xray 11/25/15. Pt from [place], past exposure to TB. Pt agreed to referral and treatment by [facility]- referral sent 12/15/2015 (telephone [phone], fax #[phone]).   . 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 Pseudophakia 06/11/2015  . Seasonal allergic rhinitis 09/02/2016  . Senile osteoporosis 06/26/2015   Overview:  Bone density 12/2014 [facility]. T-4.4 and -3.8. On prolia managed by Dr. Allena Katz.  Normal intact PTH in 2017.  Marland Kitchen Thrombocytopenia (HCC) 09/26/2015  . Xerosis of skin 04/14/2016   BP 128/85   Pulse 80   Ht 5\' 3"  (1.6 m)   Wt 150 lb (68 kg)   SpO2 96%   BMI 26.57 kg/m   Opioid Risk Score:   Fall Risk Score:  `1  Depression screen PHQ 2/9  Depression screen PHQ 2/9 07/12/2018  Decreased Interest 0  Down, Depressed, Hopeless 0  PHQ - 2 Score 0  Altered sleeping 0  Tired, decreased energy 3  Change in appetite 0  Feeling bad or failure about yourself  0  Trouble concentrating 0  Moving slowly or fidgety/restless 0  Suicidal thoughts 0  PHQ-9 Score 3  Difficult doing work/chores Somewhat difficult     Review of Systems  Constitutional: Negative.   HENT: Negative.   Eyes: Negative.   Respiratory: Negative.   Cardiovascular: Negative.   Gastrointestinal: Positive for abdominal pain and nausea.       Intermittent  Endocrine: Negative.   Genitourinary: Negative.   Musculoskeletal: Positive for gait problem.  Skin: Negative.   Allergic/Immunologic: Negative.   Neurological: Positive for dizziness and weakness.  Hematological: Negative.   Psychiatric/Behavioral: Negative.   All other  systems reviewed and are negative.      Objective:   Physical Exam Constitutional: No distress . Vital signs reviewed. HENT: Normocephalic.  Atraumatic. Eyes: EOMI. No discharge. Cardiovascular: RRR. No JVD. Respiratory: CTA bilaterally. Normal effort. GI: BS +. Non-distended. Musc: No edema or tenderness in extremities. Neuro: Alert  Motor: Grossly appears to be 4+/5 throughout Skin: Warm and dry.  Intact. Psych: Distracted    Assessment & Plan:  78 year old right-handed limited English-speaking female with history of diabetes mellitus, hyperlipidemia, anemia, chronic kidney disease stage II presents for hospital follow up for metabolic encephalopathy.   1. Decreased functional mobility with debilitation secondary to septic metabolic encephalopathy/urosepsis.   Cont therapies  Improving  2. Hypertension.   Cont meds  Controlled today  3. Seizure prophylaxis.   Patient only  taking Keppra 500 daily, may d/c  4. Diabetes mellitus.   CBGs controlled  Cont meds  5. Gait abnormality  Cont wheelchair/walker for safety  Meds reviewed Referrals reviewed - needs PCP appointment All questions answered

## 2018-08-24 ENCOUNTER — Telehealth: Payer: Self-pay | Admitting: *Deleted

## 2018-08-24 ENCOUNTER — Encounter: Payer: Self-pay | Admitting: *Deleted

## 2018-08-24 NOTE — Telephone Encounter (Signed)
Patient does not speak [place]. Called dgtr-in-law, Anapaula Nordland and advised her due to current COVID 19 pandemic, our office is severely reducing in person visits in order to minimize the risk to our patients and healthcare providers. We recommend to convert patient's appointment to a video visit.  I advised an interpreter would need to be on the phone. She requested the appointment be rescheduled. Rescheduled for June. She verbalized understanding, appreciation.

## 2018-09-06 ENCOUNTER — Ambulatory Visit: Payer: Self-pay | Admitting: Diagnostic Neuroimaging

## 2018-09-11 ENCOUNTER — Ambulatory Visit: Payer: Medicare Other | Admitting: Physical Medicine & Rehabilitation

## 2018-10-10 NOTE — Telephone Encounter (Addendum)
Contacted [employer] interpre[employer], spoke with Deval and advised patient's appt needs to be rescheduled to either a Tues afternoon in office in June or to July in office. Both numbers for daughter, Azucena Cecil unable to LVM. Deval called mobile # listed for patient and was able to LVM. She advised in her VM that patient needs to reschedule his appointment, and she gave office #.

## 2018-10-24 NOTE — Telephone Encounter (Addendum)
Called daughter, Lambert Keto and informed her that due to current COVID 19 pandemic, our office is still severely reducing in person visits in order to minimize the risk to our patients and healthcare providers. We recommend to convert mother's  appointment to a video visit or reschedule for July. Advised it needs to be rescheduled because we are closed on Fridays. She requested to be rescheduled to July; we did this. I explained in office check in procedure, gave her number and address. She  verbalized understanding, appreciation.

## 2018-10-27 ENCOUNTER — Ambulatory Visit: Payer: Self-pay | Admitting: Diagnostic Neuroimaging

## 2018-11-14 ENCOUNTER — Encounter: Payer: Self-pay | Admitting: Diagnostic Neuroimaging

## 2018-11-14 ENCOUNTER — Other Ambulatory Visit: Payer: Self-pay

## 2018-11-14 ENCOUNTER — Ambulatory Visit (INDEPENDENT_AMBULATORY_CARE_PROVIDER_SITE_OTHER): Payer: Medicare Other | Admitting: Diagnostic Neuroimaging

## 2018-11-14 VITALS — BP 141/71 | HR 73 | Temp 98.3°F | Ht 63.0 in

## 2018-11-14 DIAGNOSIS — R410 Disorientation, unspecified: Secondary | ICD-10-CM | POA: Diagnosis not present

## 2018-11-14 DIAGNOSIS — R569 Unspecified convulsions: Secondary | ICD-10-CM

## 2018-11-14 NOTE — Progress Notes (Signed)
[facility]  PATIENT: Lori Hardy DOB: Sep 19, 1940  REFERRING CLINICIAN: D Anguilli  HISTORY FROM: patient  REASON FOR VISIT: new consult     HISTORICAL  CHIEF COMPLAINT:  Chief Complaint  Patient presents with  . Metabolic encephalopathy    rm 7, New pt, dgtr Azucena CecilSarika, interpreting    HISTORY OF PRESENT ILLNESS:   78 year old female here for evaluation of seizure.  Patient has been to the hospital in February 2020 with altered mental status, fever, hyperglycemia, hyponatremia, sepsis.  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  She was treated empirically with antiseizure medication Keppra 500 mg twice a day.  In March she saw a rehab Dr. Allena KatzPatel, who discontinued Keppra.  However patient does continue to take Keppra 250 mg once a day at bedtime to help her sleep.  No further seizures or events.  Patient continues to complain of stomach pain, diarrhea, insomnia.  She has not followed up with PCP about these issues.   REVIEW OF SYSTEMS: Full 14 system review of systems performed and negative with exception of: as per HPI.  ALLERGIES: Allergies  Allergen Reactions  . Brimonidine Itching    Redness and itching eye Redness and itching Redness and itching     HOME MEDICATIONS: Outpatient Medications Prior to Visit  Medication Sig Dispense Refill  . amLODipine (NORVASC) 5 MG tablet Take 1 tablet (5 mg total) by mouth daily. 30 tablet 1  . Cholecalciferol (VITAMIN D3) 25 MCG (1000 UT) CAPS Take 1,000 mg by mouth daily.    . diphenoxylate-atropine (LOMOTIL) 2.5-0.025 MG tablet TAKE 1 TABLET BY MOUTH 4 TIMES DAILY AS NEEDED FOR UP TO 10 DAYS FOR DIARRHEA    . dorzolamide (TRUSOPT) 2 % ophthalmic solution Place 1 drop into the right eye 2 (two) times daily.    Marland Kitchen. glipiZIDE (GLUCOTROL) 10 MG tablet Take 10 mg by mouth 2 (two) times daily.    . insulin glargine (LANTUS) 100 unit/mL SOPN Inject 0.2 mLs  (20 Units total) into the skin daily. 15 mL 11  . latanoprost (XALATAN) 0.005 % ophthalmic solution Place 1 drop into both eyes at bedtime.    . levETIRAcetam (KEPPRA) 500 MG tablet Take 1 tablet (500 mg total) by mouth 2 (two) times daily. 60 tablet 1  . levothyroxine (SYNTHROID, LEVOTHROID) 25 MCG tablet Take 1 tablet (25 mcg total) by mouth daily before breakfast. 30 tablet 0  . loratadine (CLARITIN) 10 MG tablet Take 10 mg by mouth daily.    Marland Kitchen. losartan (COZAAR) 100 MG tablet Take 0.5 tablets (50 mg total) by mouth daily. 30 tablet 0  . Melatonin 1 MG TABS Take 1 mg by mouth at bedtime.    . metoprolol tartrate (LOPRESSOR) 25 MG tablet Take 1 tablet (25 mg total) by mouth 2 (two) times daily. 60 tablet 0  . Multiple Vitamin (MULTIVITAMIN WITH MINERALS) TABS tablet Take 1 tablet by mouth daily.    Marland Kitchen. omeprazole (PRILOSEC) 40 MG capsule Take 40 mg by mouth daily.    . ondansetron (ZOFRAN) 4 MG tablet TAKE 1 TABLET BY MOUTH EVERY 8 HOURS AS NEEDED FOR NAUSEA AND VOMITING    . pioglitazone (ACTOS) 45 MG tablet Take 45 mg by mouth daily.    . rosuvastatin (CRESTOR) 10 MG tablet Take 1 tablet (10 mg total) by mouth at bedtime. 30 tablet 0  . timolol (TIMOPTIC) 0.5 % ophthalmic solution Place 1 drop into the right eye daily.    .Marland Kitchen  acetaminophen (TYLENOL) 325 MG tablet Take 2 tablets (650 mg total) by mouth every 6 (six) hours as needed for mild pain (or Fever >/= 101). (Patient not taking: Reported on 11/14/2018) 30 tablet 0  . esomeprazole (NEXIUM) 40 MG capsule Take 1 capsule (40 mg total) by mouth daily at 12 noon. 30 capsule 0   No facility-administered medications prior to visit.     PAST MEDICAL HISTORY: Past Medical History:  Diagnosis Date  . Aortic atherosclerosis (HCC) 12/15/2015   Overview:  By imaging, xray 11/2015.  . Closed fracture of right proximal humerus 04/06/2017  . Compression fracture of L4 lumbar vertebra, closed, initial encounter (HCC) 01/12/2017  . Diabetes mellitus without  complication (HCC)   . Exposure to TB 09/26/2015  . 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  Differential includes diet, medications, possible pancreatic insufficiency.  Negative C. difficile 09/27/2015  . H. pylori infection 10/07/2016   Overview:  BX confirmed. 09/15/2016- [facility].   Marland Kitchen. History of compression fracture of spine 11/20/2015   Overview:  T12, on prolia.  Marland Kitchen. History of Helicobacter pylori infection 11/20/2015   Overview:  By EGD 2017.  [facility]  . Hx of pancreatitis 09/26/2015   Overview:  08/2015 [facility]  . Hypertension   . Hyponatremia 04/18/2017  . 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 No active disease on chest xray 11/25/15. Pt from [place], past exposure to TB. Pt agreed to referral and treatment by [facility]- referral sent 12/15/2015 (telephone [phone], fax #[phone]).   . Metabolic encephalopathy   . 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 Pseudophakia 06/11/2015  . Seasonal allergic rhinitis 09/02/2016  . Senile osteoporosis 06/26/2015   Overview:  Bone density 12/2014 [facility]. T-4.4 and -3.8. On prolia managed by Dr. Allena KatzPatel.  Normal intact PTH in 2017.  Marland Kitchen. Thrombocytopenia (HCC) 09/26/2015  . Xerosis of skin 04/14/2016    PAST SURGICAL HISTORY: Past Surgical History:  Procedure Laterality Date  . cataracts      FAMILY HISTORY: No family history on file.  SOCIAL HISTORY: Social History   Socioeconomic History  . Marital status: Widowed    Spouse name: Not on file  . Number of children: Not on file  . Years of education: Not on file  . Highest education level: Not on file  Occupational  History  . Not on file  Social Needs  . Financial resource strain: Not on file  . Food insecurity    Worry: Not on file    Inability: Not on file  . Transportation needs    Medical: Not on file    Non-medical: Not on file  Tobacco Use  . Smoking status: Never Smoker  . Smokeless tobacco: Never Used  Substance and Sexual Activity  . Alcohol use: Not on file  . Drug use: Not on file  . Sexual activity: Not on file  Lifestyle  . Physical activity    Days per week: Not on file    Minutes per session: Not on file  . Stress: Not on file  Relationships  . Social Musicianconnections    Talks on phone: Not on file    Gets together: Not  on file    Attends religious service: Not on file    Active member of club or organization: Not on file    Attends meetings of clubs or organizations: Not on file    Relationship status: Not on file  . Intimate partner violence    Fear of current or ex partner: Not on file    Emotionally abused: Not on file    Physically abused: Not on file    Forced sexual activity: Not on file  Other Topics Concern  . Not on file  Social History Narrative   Lives with Daughter in law Phelps, son, two grandkids, 9 & 33 years old.    Daily care giver [employer], sees patient daily for 2 hours.      PHYSICAL EXAM  GENERAL EXAM/CONSTITUTIONAL: Vitals:  Vitals:   11/14/18 1441  BP: (!) 141/71  Pulse: 73  Temp: 98.3 F (36.8 C)  Height: 5\' 3"  (1.6 m)     Body mass index is 26.57 kg/m. Wt Readings from Last 3 Encounters:  07/12/18 150 lb (68 kg)  06/22/18 155 lb 10.3 oz (70.6 kg)  06/18/18 146 lb (66.2 kg)     Patient is in no distress; well developed, nourished and groomed; neck is supple  CARDIOVASCULAR:  Examination of carotid arteries is normal; no carotid bruits  Regular rate and rhythm, no murmurs  Examination of peripheral vascular system by observation and palpation is normal  EYES:  Ophthalmoscopic exam of optic discs and posterior  segments is normal; no papilledema or hemorrhages  No exam data present  MUSCULOSKELETAL:  Gait, strength, tone, movements noted in Neurologic exam below  NEUROLOGIC: MENTAL STATUS:  No flowsheet data found.  awake, alert  language fluent, comprehension intact; gujarati language  fund of knowledge appropriate  CRANIAL NERVE:   2nd - no papilledema on fundoscopic exam  2nd, 3rd, 4th, 6th - pupils equal and reactive to light, visual fields full to confrontation, extraocular muscles intact, no nystagmus  5th - facial sensation symmetric  7th - facial strength symmetric  8th - hearing intact  9th - palate elevates symmetrically, uvula midline  11th - shoulder shrug symmetric  12th - tongue protrusion midline  MOTOR:   normal bulk and tone, 4+ strength in the BUE, BLE  SENSORY:   normal and symmetric to light touch; DECR VIB AND TEMP IN LEGS  COORDINATION:   finger-nose-finger, fine finger movements normal  REFLEXES:   deep tendon reflexes TRACE resent and symmetric  GAIT/STATION:   IN WHEELCHAIR     DIAGNOSTIC DATA (LABS, IMAGING, TESTING) - I reviewed patient records, labs, notes, testing and imaging myself where available.  Lab Results  Component Value Date   WBC 8.7 06/27/2018   HGB 10.6 (L) 06/27/2018   HCT 34.8 (L) 06/27/2018   MCV 93.5 06/27/2018   PLT 226 06/27/2018      Component Value Date/Time   NA 136 06/23/2018 0506   K 4.1 06/23/2018 0506   CL 104 06/23/2018 0506   CO2 22 06/23/2018 0506   GLUCOSE 242 (H) 06/23/2018 0506   BUN 8 06/23/2018 0506   CREATININE 0.91 06/23/2018 0506   CALCIUM 8.3 (L) 06/23/2018 0506   PROT 5.6 (L) 06/23/2018 0506   ALBUMIN 2.2 (L) 06/23/2018 0506   AST 38 06/23/2018 0506   ALT 31 06/23/2018 0506   ALKPHOS 91 06/23/2018 0506   BILITOT 0.4 06/23/2018 0506   GFRNONAA >60 06/23/2018 0506   GFRAA >60 06/23/2018 7035  No results found for: CHOL, HDL, LDLCALC, LDLDIRECT, TRIG, CHOLHDL Lab Results   Component Value Date   HGBA1C 9.7 (H) 06/20/2018   Lab Results  Component Value Date   VITAMINB12 448 06/19/2018   Lab Results  Component Value Date   TSH 1.678 06/19/2018    06/18/18 EEG  1) attenuation of faster frequencies in the right 2) generalized irregular delta activity 3) slow posterior dominant rhythm  06/18/18 MRI brain 1.  No acute intracranial abnormality. 2. Generalized cerebral cortical volume loss with advanced signal changes in the white matter, deep gray matter and pons most commonly due to chronic small vessel disease.   ASSESSMENT AND PLAN  78 y.o. year old female here with:  Dx:  1. Delirium   2. New onset seizure [facility](HCC)     PLAN:  New onset SEIZURE  - Seizure in the setting of sepsis, hyperglycemia, hyponatremia, now on low-dose of Keppra 250 mg at bedtime; patient was supposed to stop in March 2020 per PM&R clinic. Ok to wean off LEV 250mg  daily for now and monitor symptoms.  Return for return to PCP.    Suanne MarkerVIKRAM R. PENUMALLI, MD 11/14/2018, 3:10 PM Certified in Neurology, Neurophysiology and Neuroimaging  [facility] [address], [address] [place], [place] [address] [phone]

## 2018-12-19 ENCOUNTER — Emergency Department (HOSPITAL_COMMUNITY): Payer: Medicare Other

## 2018-12-19 ENCOUNTER — Encounter (HOSPITAL_COMMUNITY): Payer: Self-pay

## 2018-12-19 ENCOUNTER — Inpatient Hospital Stay (HOSPITAL_COMMUNITY): Payer: Medicare Other

## 2018-12-19 ENCOUNTER — Other Ambulatory Visit: Payer: Self-pay

## 2018-12-19 ENCOUNTER — Inpatient Hospital Stay (HOSPITAL_COMMUNITY)
Admission: EM | Admit: 2018-12-19 | Discharge: 2018-12-28 | DRG: 640 | Disposition: A | Discharge status: Hospice | Payer: Medicare Other | Attending: Internal Medicine | Admitting: Internal Medicine

## 2018-12-19 ENCOUNTER — Other Ambulatory Visit (HOSPITAL_COMMUNITY): Payer: Medicare Other

## 2018-12-19 DIAGNOSIS — M810 Age-related osteoporosis without current pathological fracture: Secondary | ICD-10-CM | POA: Diagnosis not present

## 2018-12-19 DIAGNOSIS — K7469 Other cirrhosis of liver: Secondary | ICD-10-CM

## 2018-12-19 DIAGNOSIS — R109 Unspecified abdominal pain: Secondary | ICD-10-CM

## 2018-12-19 DIAGNOSIS — N183 Chronic kidney disease, stage 3 unspecified: Secondary | ICD-10-CM | POA: Diagnosis present

## 2018-12-19 DIAGNOSIS — I130 Hypertensive heart and chronic kidney disease with heart failure and stage 1 through stage 4 chronic kidney disease, or unspecified chronic kidney disease: Secondary | ICD-10-CM | POA: Diagnosis not present

## 2018-12-19 DIAGNOSIS — E8779 Other fluid overload: Secondary | ICD-10-CM | POA: Diagnosis not present

## 2018-12-19 DIAGNOSIS — E669 Obesity, unspecified: Secondary | ICD-10-CM | POA: Diagnosis present

## 2018-12-19 DIAGNOSIS — E875 Hyperkalemia: Secondary | ICD-10-CM | POA: Diagnosis not present

## 2018-12-19 DIAGNOSIS — R601 Generalized edema: Secondary | ICD-10-CM

## 2018-12-19 DIAGNOSIS — Z79899 Other long term (current) drug therapy: Secondary | ICD-10-CM

## 2018-12-19 DIAGNOSIS — J9601 Acute respiratory failure with hypoxia: Secondary | ICD-10-CM | POA: Diagnosis not present

## 2018-12-19 DIAGNOSIS — H401190 Primary open-angle glaucoma, unspecified eye, stage unspecified: Secondary | ICD-10-CM | POA: Diagnosis present

## 2018-12-19 DIAGNOSIS — E039 Hypothyroidism, unspecified: Secondary | ICD-10-CM | POA: Diagnosis present

## 2018-12-19 DIAGNOSIS — R140 Abdominal distension (gaseous): Secondary | ICD-10-CM

## 2018-12-19 DIAGNOSIS — Z20828 Contact with and (suspected) exposure to other viral communicable diseases: Secondary | ICD-10-CM | POA: Diagnosis present

## 2018-12-19 DIAGNOSIS — I5031 Acute diastolic (congestive) heart failure: Secondary | ICD-10-CM | POA: Diagnosis not present

## 2018-12-19 DIAGNOSIS — D631 Anemia in chronic kidney disease: Secondary | ICD-10-CM | POA: Diagnosis present

## 2018-12-19 DIAGNOSIS — Z888 Allergy status to other drugs, medicaments and biological substances status: Secondary | ICD-10-CM

## 2018-12-19 DIAGNOSIS — K529 Noninfective gastroenteritis and colitis, unspecified: Secondary | ICD-10-CM | POA: Diagnosis present

## 2018-12-19 DIAGNOSIS — Z66 Do not resuscitate: Secondary | ICD-10-CM | POA: Diagnosis not present

## 2018-12-19 DIAGNOSIS — N179 Acute kidney failure, unspecified: Secondary | ICD-10-CM | POA: Diagnosis present

## 2018-12-19 DIAGNOSIS — R7989 Other specified abnormal findings of blood chemistry: Secondary | ICD-10-CM

## 2018-12-19 DIAGNOSIS — R0600 Dyspnea, unspecified: Secondary | ICD-10-CM

## 2018-12-19 DIAGNOSIS — E1151 Type 2 diabetes mellitus with diabetic peripheral angiopathy without gangrene: Secondary | ICD-10-CM | POA: Diagnosis present

## 2018-12-19 DIAGNOSIS — Z1612 Extended spectrum beta lactamase (ESBL) resistance: Secondary | ICD-10-CM | POA: Diagnosis present

## 2018-12-19 DIAGNOSIS — L8990 Pressure ulcer of unspecified site, unspecified stage: Secondary | ICD-10-CM | POA: Insufficient documentation

## 2018-12-19 DIAGNOSIS — E119 Type 2 diabetes mellitus without complications: Secondary | ICD-10-CM

## 2018-12-19 DIAGNOSIS — N390 Urinary tract infection, site not specified: Secondary | ICD-10-CM | POA: Diagnosis present

## 2018-12-19 DIAGNOSIS — Z6832 Body mass index (BMI) 32.0-32.9, adult: Secondary | ICD-10-CM | POA: Diagnosis not present

## 2018-12-19 DIAGNOSIS — G9341 Metabolic encephalopathy: Secondary | ICD-10-CM | POA: Diagnosis present

## 2018-12-19 DIAGNOSIS — E8770 Fluid overload, unspecified: Secondary | ICD-10-CM | POA: Diagnosis present

## 2018-12-19 DIAGNOSIS — Z515 Encounter for palliative care: Secondary | ICD-10-CM | POA: Diagnosis not present

## 2018-12-19 DIAGNOSIS — K219 Gastro-esophageal reflux disease without esophagitis: Secondary | ICD-10-CM | POA: Diagnosis present

## 2018-12-19 DIAGNOSIS — E1165 Type 2 diabetes mellitus with hyperglycemia: Secondary | ICD-10-CM | POA: Diagnosis present

## 2018-12-19 DIAGNOSIS — K767 Hepatorenal syndrome: Secondary | ICD-10-CM

## 2018-12-19 DIAGNOSIS — IMO0001 Reserved for inherently not codable concepts without codable children: Secondary | ICD-10-CM

## 2018-12-19 DIAGNOSIS — Z79890 Hormone replacement therapy (postmenopausal): Secondary | ICD-10-CM

## 2018-12-19 DIAGNOSIS — R5381 Other malaise: Secondary | ICD-10-CM | POA: Diagnosis present

## 2018-12-19 DIAGNOSIS — R443 Hallucinations, unspecified: Secondary | ICD-10-CM | POA: Diagnosis present

## 2018-12-19 DIAGNOSIS — R400 Somnolence: Secondary | ICD-10-CM

## 2018-12-19 DIAGNOSIS — E1122 Type 2 diabetes mellitus with diabetic chronic kidney disease: Secondary | ICD-10-CM | POA: Diagnosis present

## 2018-12-19 DIAGNOSIS — K5641 Fecal impaction: Secondary | ICD-10-CM | POA: Diagnosis present

## 2018-12-19 DIAGNOSIS — I10 Essential (primary) hypertension: Secondary | ICD-10-CM

## 2018-12-19 DIAGNOSIS — B9620 Unspecified Escherichia coli [E. coli] as the cause of diseases classified elsewhere: Secondary | ICD-10-CM | POA: Diagnosis present

## 2018-12-19 DIAGNOSIS — L89611 Pressure ulcer of right heel, stage 1: Secondary | ICD-10-CM | POA: Diagnosis present

## 2018-12-19 DIAGNOSIS — R627 Adult failure to thrive: Secondary | ICD-10-CM | POA: Diagnosis not present

## 2018-12-19 DIAGNOSIS — R8281 Pyuria: Secondary | ICD-10-CM | POA: Diagnosis present

## 2018-12-19 DIAGNOSIS — E871 Hypo-osmolality and hyponatremia: Secondary | ICD-10-CM | POA: Diagnosis present

## 2018-12-19 DIAGNOSIS — G9340 Encephalopathy, unspecified: Secondary | ICD-10-CM

## 2018-12-19 DIAGNOSIS — Z794 Long term (current) use of insulin: Secondary | ICD-10-CM

## 2018-12-19 DIAGNOSIS — R4182 Altered mental status, unspecified: Secondary | ICD-10-CM | POA: Diagnosis present

## 2018-12-19 DIAGNOSIS — K7460 Unspecified cirrhosis of liver: Secondary | ICD-10-CM | POA: Diagnosis present

## 2018-12-19 LAB — GLUCOSE, CAPILLARY
Glucose-Capillary: 160 mg/dL — ABNORMAL HIGH (ref 70–99)
Glucose-Capillary: 76 mg/dL (ref 70–99)
Glucose-Capillary: 85 mg/dL (ref 70–99)
Glucose-Capillary: 94 mg/dL (ref 70–99)

## 2018-12-19 LAB — URINALYSIS, ROUTINE W REFLEX MICROSCOPIC
Bilirubin Urine: NEGATIVE
Glucose, UA: 50 mg/dL — AB
Ketones, ur: NEGATIVE mg/dL
Nitrite: POSITIVE — AB
Protein, ur: 100 mg/dL — AB
Specific Gravity, Urine: 1.02 (ref 1.005–1.030)
WBC, UA: >50 WBC/hpf — ABNORMAL HIGH (ref 0–5)
pH: 7 (ref 5.0–8.0)

## 2018-12-19 LAB — COMPREHENSIVE METABOLIC PANEL
ALT: 30 U/L (ref 0–44)
AST: 32 U/L (ref 15–41)
Albumin: 3.1 g/dL — ABNORMAL LOW (ref 3.5–5.0)
Alkaline Phosphatase: 95 U/L (ref 38–126)
Anion gap: 6 (ref 5–15)
BUN: 14 mg/dL (ref 8–23)
CO2: 23 mmol/L (ref 22–32)
Calcium: 8.1 mg/dL — ABNORMAL LOW (ref 8.9–10.3)
Chloride: 85 mmol/L — ABNORMAL LOW (ref 98–111)
Creatinine, Ser: 1.33 mg/dL — ABNORMAL HIGH (ref 0.44–1.00)
GFR calc Af Amer: 44 mL/min — ABNORMAL LOW (ref >60)
GFR calc non Af Amer: 38 mL/min — ABNORMAL LOW (ref >60)
Glucose, Bld: 233 mg/dL — ABNORMAL HIGH (ref 70–99)
Potassium: 5.2 mmol/L — ABNORMAL HIGH (ref 3.5–5.1)
Sodium: 114 mmol/L — CL (ref 135–145)
Total Bilirubin: 0.7 mg/dL (ref 0.3–1.2)
Total Protein: 7.6 g/dL (ref 6.5–8.1)

## 2018-12-19 LAB — CBC WITH DIFFERENTIAL/PLATELET
Abs Immature Granulocytes: 0.1 10*3/uL — ABNORMAL HIGH (ref 0.00–0.07)
Basophils Absolute: 0 10*3/uL (ref 0.0–0.1)
Basophils Relative: 0 %
Eosinophils Absolute: 0.1 10*3/uL (ref 0.0–0.5)
Eosinophils Relative: 1 %
HCT: 30.4 % — ABNORMAL LOW (ref 36.0–46.0)
Hemoglobin: 10.2 g/dL — ABNORMAL LOW (ref 12.0–15.0)
Immature Granulocytes: 1 %
Lymphocytes Relative: 17 %
Lymphs Abs: 2 10*3/uL (ref 0.7–4.0)
MCH: 29.7 pg (ref 26.0–34.0)
MCHC: 33.6 g/dL (ref 30.0–36.0)
MCV: 88.4 fL (ref 80.0–100.0)
Monocytes Absolute: 1.6 10*3/uL — ABNORMAL HIGH (ref 0.1–1.0)
Monocytes Relative: 14 %
Neutro Abs: 8 10*3/uL — ABNORMAL HIGH (ref 1.7–7.7)
Neutrophils Relative %: 67 %
Platelets: 217 10*3/uL (ref 150–400)
RBC: 3.44 MIL/uL — ABNORMAL LOW (ref 3.87–5.11)
RDW: 14.1 % (ref 11.5–15.5)
WBC: 11.7 10*3/uL — ABNORMAL HIGH (ref 4.0–10.5)
nRBC: 0 % (ref 0.0–0.2)

## 2018-12-19 LAB — SODIUM
Sodium: 119 mmol/L — CL (ref 135–145)
Sodium: 116 mmol/L — CL (ref 135–145)
Sodium: 118 mmol/L — CL (ref 135–145)
Sodium: 120 mmol/L — ABNORMAL LOW (ref 135–145)
Sodium: 122 mmol/L — ABNORMAL LOW (ref 135–145)

## 2018-12-19 LAB — AMMONIA: Ammonia: 13 umol/L (ref 9–35)

## 2018-12-19 LAB — SODIUM, URINE, RANDOM: Sodium, Ur: 11 mmol/L

## 2018-12-19 LAB — OSMOLALITY, URINE: Osmolality, Ur: 283 mOsm/kg — ABNORMAL LOW (ref 300–900)

## 2018-12-19 LAB — OSMOLALITY: Osmolality: 258 mOsm/kg — ABNORMAL LOW (ref 275–295)

## 2018-12-19 LAB — SARS CORONAVIRUS 2 (TAT 6-24 HRS): SARS Coronavirus 2: NEGATIVE

## 2018-12-19 LAB — MRSA PCR SCREENING: MRSA by PCR: NEGATIVE

## 2018-12-19 LAB — LACTIC ACID, PLASMA: Lactic Acid, Venous: 1.1 mmol/L (ref 0.5–1.9)

## 2018-12-19 MED ORDER — ACETAMINOPHEN 325 MG PO TABS
650.0000 mg | ORAL_TABLET | Freq: Four times a day (QID) | ORAL | Status: DC | PRN
  Administered 2018-12-20 – 2018-12-25 (×7): 650 mg via ORAL
  Filled 2018-12-19 (×8): qty 2

## 2018-12-19 MED ORDER — BASAGLAR KWIKPEN 100 UNIT/ML ~~LOC~~ SOPN
14.0000 [IU] | PEN_INJECTOR | Freq: Every day | SUBCUTANEOUS | Status: DC
  Filled 2018-12-19: qty 3

## 2018-12-19 MED ORDER — SODIUM CHLORIDE 3 % IV SOLN
INTRAVENOUS | Status: DC
  Filled 2018-12-19: qty 500

## 2018-12-19 MED ORDER — CHLORHEXIDINE GLUCONATE CLOTH 2 % EX PADS
6.0000 | MEDICATED_PAD | Freq: Every day | CUTANEOUS | Status: DC
  Administered 2018-12-20 – 2018-12-26 (×6): 6 via TOPICAL

## 2018-12-19 MED ORDER — ROSUVASTATIN CALCIUM 10 MG PO TABS
10.0000 mg | ORAL_TABLET | Freq: Every day | ORAL | Status: DC
  Administered 2018-12-19 – 2018-12-27 (×9): 10 mg via ORAL
  Filled 2018-12-19 (×11): qty 1

## 2018-12-19 MED ORDER — AMLODIPINE BESYLATE 5 MG PO TABS
5.0000 mg | ORAL_TABLET | Freq: Every day | ORAL | Status: DC
  Administered 2018-12-19 – 2018-12-20 (×2): 5 mg via ORAL
  Filled 2018-12-19 (×2): qty 1

## 2018-12-19 MED ORDER — IOHEXOL 300 MG/ML  SOLN
100.0000 mL | Freq: Once | INTRAMUSCULAR | Status: AC | PRN
  Administered 2018-12-19: 04:00:00 80 mL via INTRAVENOUS

## 2018-12-19 MED ORDER — CIPROFLOXACIN IN D5W 400 MG/200ML IV SOLN
400.0000 mg | Freq: Once | INTRAVENOUS | Status: AC
  Administered 2018-12-19: 06:00:00 400 mg via INTRAVENOUS
  Filled 2018-12-19: qty 200

## 2018-12-19 MED ORDER — SODIUM CHLORIDE 0.9 % IV SOLN
Freq: Once | INTRAVENOUS | Status: AC
  Administered 2018-12-19: 03:00:00 via INTRAVENOUS

## 2018-12-19 MED ORDER — POLYETHYLENE GLYCOL 3350 17 G PO PACK
17.0000 g | PACK | Freq: Every day | ORAL | Status: DC | PRN
  Administered 2018-12-20: 11:00:00 17 g via ORAL
  Filled 2018-12-19: qty 1

## 2018-12-19 MED ORDER — METRONIDAZOLE IN NACL 5-0.79 MG/ML-% IV SOLN
500.0000 mg | Freq: Once | INTRAVENOUS | Status: AC
  Administered 2018-12-19: 07:00:00 500 mg via INTRAVENOUS
  Filled 2018-12-19: qty 100

## 2018-12-19 MED ORDER — ACETAMINOPHEN 650 MG RE SUPP: 650.0000 mg | Freq: Four times a day (QID) | RECTAL | Status: DC | PRN

## 2018-12-19 MED ORDER — SODIUM CHLORIDE 0.9 % IV SOLN
1.0000 g | INTRAVENOUS | Status: DC
  Administered 2018-12-19 – 2018-12-24 (×6): 1 g via INTRAVENOUS
  Filled 2018-12-19: qty 10
  Filled 2018-12-19 (×2): qty 1
  Filled 2018-12-19: qty 10
  Filled 2018-12-19 (×2): qty 1

## 2018-12-19 MED ORDER — ONDANSETRON HCL 4 MG/2ML IJ SOLN
4.0000 mg | Freq: Once | INTRAMUSCULAR | Status: AC
  Administered 2018-12-19: 03:00:00 4 mg via INTRAVENOUS
  Filled 2018-12-19: qty 2

## 2018-12-19 MED ORDER — METOPROLOL TARTRATE 25 MG PO TABS
25.0000 mg | ORAL_TABLET | Freq: Two times a day (BID) | ORAL | Status: DC
  Administered 2018-12-19 – 2018-12-26 (×16): 25 mg via ORAL
  Filled 2018-12-19 (×16): qty 1

## 2018-12-19 MED ORDER — PANTOPRAZOLE SODIUM 40 MG PO TBEC
40.0000 mg | DELAYED_RELEASE_TABLET | Freq: Every day | ORAL | Status: DC
  Administered 2018-12-19 – 2018-12-26 (×8): 40 mg via ORAL
  Filled 2018-12-19 (×8): qty 1

## 2018-12-19 MED ORDER — INSULIN ASPART 100 UNIT/ML ~~LOC~~ SOLN
0.0000 [IU] | SUBCUTANEOUS | Status: DC
  Administered 2018-12-19: 13:00:00 2 [IU] via SUBCUTANEOUS
  Administered 2018-12-20 (×2): 1 [IU] via SUBCUTANEOUS
  Administered 2018-12-21: 20:00:00 7 [IU] via SUBCUTANEOUS
  Administered 2018-12-21: 09:00:00 1 [IU] via SUBCUTANEOUS
  Administered 2018-12-21 (×2): 2 [IU] via SUBCUTANEOUS
  Administered 2018-12-21: 23:00:00 7 [IU] via SUBCUTANEOUS
  Administered 2018-12-22: 05:00:00 3 [IU] via SUBCUTANEOUS
  Filled 2018-12-19: qty 0.09

## 2018-12-19 MED ORDER — ONDANSETRON HCL 4 MG/2ML IJ SOLN
4.0000 mg | Freq: Four times a day (QID) | INTRAMUSCULAR | Status: DC | PRN
  Administered 2018-12-24 – 2018-12-27 (×4): 4 mg via INTRAVENOUS
  Filled 2018-12-19 (×4): qty 2

## 2018-12-19 MED ORDER — SODIUM CHLORIDE 0.9% FLUSH
3.0000 mL | Freq: Two times a day (BID) | INTRAVENOUS | Status: DC
  Administered 2018-12-19 – 2018-12-28 (×15): 3 mL via INTRAVENOUS

## 2018-12-19 MED ORDER — SODIUM CHLORIDE 0.9% FLUSH
3.0000 mL | INTRAVENOUS | Status: DC | PRN
  Administered 2018-12-23: 09:00:00 3 mL via INTRAVENOUS
  Filled 2018-12-19: qty 3

## 2018-12-19 MED ORDER — SODIUM CHLORIDE 0.9 % IV SOLN: 250.0000 mL | INTRAVENOUS | Status: DC | PRN

## 2018-12-19 MED ORDER — INSULIN GLARGINE 100 UNIT/ML ~~LOC~~ SOLN
14.0000 [IU] | Freq: Every day | SUBCUTANEOUS | Status: DC
  Administered 2018-12-19: 13:00:00 14 [IU] via SUBCUTANEOUS
  Filled 2018-12-19: qty 0.14

## 2018-12-19 MED ORDER — ONDANSETRON HCL 4 MG PO TABS: 4.0000 mg | ORAL_TABLET | Freq: Four times a day (QID) | ORAL | Status: DC | PRN

## 2018-12-19 MED ORDER — LEVOTHYROXINE SODIUM 25 MCG PO TABS
25.0000 ug | ORAL_TABLET | Freq: Every day | ORAL | Status: DC
  Administered 2018-12-20 – 2018-12-28 (×8): 25 ug via ORAL
  Filled 2018-12-19 (×10): qty 1

## 2018-12-19 MED ORDER — LATANOPROST 0.005 % OP SOLN
1.0000 [drp] | Freq: Every day | OPHTHALMIC | Status: DC
  Administered 2018-12-19 – 2018-12-27 (×9): 1 [drp] via OPHTHALMIC
  Filled 2018-12-19: qty 2.5

## 2018-12-19 MED ORDER — SODIUM CHLORIDE (PF) 0.9 % IJ SOLN
INTRAMUSCULAR | Status: AC
  Filled 2018-12-19: qty 50

## 2018-12-19 NOTE — Consult Note (Signed)
[facility]  INPATIENT CONSULTATION  Reason for Consultation: Hyponatremia Requesting Provider: Dr. Arlean HoppingSchertz  HPI: Lori Hardy is an 78 y.o. female with type 2 DM, HTN, cirrhosis who presented to the ED with AMS, abd distention and nausea.    History gained from chart and DIL who is bedside as the patient poor historian.  DIL also helps as Nurse, learning disabilitytranslator. [facility] with confusion which started about 11pm.  1 wk h/o abd distention and discomfort; having BMs but less frequently.  Reportedly has been consuming lots of liquids lately (water, juice, pedialyte) though fluid restriction has been recommended in the past. 2wk h/o increasing edema.    Initial serum sodium at 247am was 114, K 5.2, Cr 1.33 (baseline), BUN 14, albumin 3.1.  Ammonia 13. Hypertonic saline was 2850mL/hr was planned but recheck at 0346am showed serum sodium 119 and that was cancelled.  Subsequently 0524 116, 0856am 118.  I/Os have not been documented. Serum Osm 258 (0327am), Uosm 283, Una 11 (urine studies at 515am).   CT A/P with contrast showing mild pericolonic stranding, chronic mesenteric edema, No ascites, extensive atherosclerotic dz.  UA cloudy, +LE and nit, ++WBC.  Culture pending. Ceftriaxone tx now after 1 dose cipro + flagyl IV given.  COVID19 neg.   Per DIL she has been having HA without vision changes, no nausea or emesis.  She feels that her mental status is clearer than initial presentation but not back to baseline.      She was admitted in 06/2018 for E Faecalis UTI complicated by AMS.  She had a seizure with post ictal Todd's paralysis in setting of hyperglycemia, hyponatremia (lowest na 124 with BG 460, so corrects higher), UTI, sepsis on background of cerebral atrophy.  She started keppra during that admission and continues.  TSH and cortisol ok during admission (AM cortisol 11).    PMH: Past Medical History:  Diagnosis Date  . Aortic atherosclerosis (HCC) 12/15/2015    Overview:  By imaging, xray 11/2015.  . Closed fracture of right proximal humerus 04/06/2017  . Compression fracture of L4 lumbar vertebra, closed, initial encounter (HCC) 01/12/2017  . Diabetes mellitus without complication (HCC)   . Exposure to TB 09/26/2015  . 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  Differential includes diet, medications, possible pancreatic insufficiency.  Negative C. difficile 09/27/2015  . H. pylori infection 10/07/2016   Overview:  BX confirmed. 09/15/2016- Dr[facility] Noe GensPeters, [facility].   Marland Kitchen. History of compression fracture of spine 11/20/2015   Overview:  T12, on prolia.  Marland Kitchen. History of Helicobacter pylori infection 11/20/2015   Overview:  By EGD 2017.  [facility]  . Hx of pancreatitis 09/26/2015   Overview:  08/2015 [facility]  . Hypertension   . Hyponatremia 04/18/2017  . 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 No active disease on chest xray 11/25/15. Pt from [place], past exposure to TB. Pt agreed to referral and treatment by [facility]- referral sent 12/15/2015 (telephone [phone], fax #[phone]).   . Metabolic encephalopathy   . 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 Pseudophakia 06/11/2015  . Seasonal allergic rhinitis 09/02/2016  . Senile osteoporosis 06/26/2015   Overview:  Bone  density 12/2014 [facility]. T-4.4 and -3.8. On prolia managed by Dr. Posey Pronto.  Normal intact PTH in 2017.  Marland Kitchen Thrombocytopenia ([place]) 09/26/2015  . Xerosis of skin 04/14/2016   PSH: Past Surgical History:  Procedure Laterality Date  . cataracts      Past Medical History:  Diagnosis Date  . Aortic atherosclerosis ([place]) 12/15/2015   Overview:  By  imaging, xray 11/2015.  . Closed fracture of right proximal humerus 04/06/2017  . Compression fracture of L4 lumbar vertebra, closed, initial encounter ([place]) 01/12/2017  . Diabetes mellitus without complication ([place])   . Exposure to TB 09/26/2015  . 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  Differential includes diet, medications, possible pancreatic insufficiency.  Negative C. difficile 09/27/2015  . H. pylori infection 10/07/2016   Overview:  BX confirmed. 09/15/2016- Dr. Ferdinand Lango, [facility].   Marland Kitchen History of compression fracture of spine 11/20/2015   Overview:  T12, on prolia.  Marland Kitchen History of Helicobacter pylori infection 11/20/2015   Overview:  By EGD 2017.  Treated with 2 courses of antibiotics at [facility]  . Hx of pancreatitis 09/26/2015   Overview:  08/2015 [facility]  . Hypertension   . Hyponatremia 04/18/2017  . 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 No active disease on chest xray 11/25/15. Pt from [place], past exposure to TB. Pt agreed to referral and treatment by [facility]- referral sent 12/15/2015 (telephone [phone], fax [phone]).   . Metabolic encephalopathy   . Polymyalgia ([place]) 01/12/2017  . 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 Pseudophakia 06/11/2015  . Seasonal allergic rhinitis 09/02/2016  . Senile osteoporosis 06/26/2015   Overview:  Bone density 12/2014 [facility]. T-4.4 and -3.8. On prolia managed by Dr. Posey Pronto.  Normal intact PTH in 2017.  Marland Kitchen Thrombocytopenia ([place]) 09/26/2015  . Xerosis of skin 04/14/2016    Medications:  I have reviewed the patient's current medications.  Medications Prior to Admission  Medication Sig Dispense Refill  . amLODipine (NORVASC) 5 MG tablet Take 1 tablet (5 mg  total) by mouth daily. 30 tablet 1  . glipiZIDE (GLUCOTROL) 10 MG tablet Take 10 mg by mouth 2 (two) times daily.    . Insulin Glargine (BASAGLAR KWIKPEN) 100 UNIT/ML SOPN Inject 14 Units into the skin daily.    Marland Kitchen latanoprost (XALATAN) 0.005 % ophthalmic solution Place 1 drop into both eyes at bedtime.    Marland Kitchen levothyroxine (SYNTHROID, LEVOTHROID) 25 MCG tablet Take 1 tablet (25 mcg total) by mouth daily before breakfast. 30 tablet 0  . losartan (COZAAR) 100 MG tablet Take 0.5 tablets (50 mg total) by mouth daily. (Patient taking differently: Take 100 mg by mouth daily. ) 30 tablet 0  . metoprolol tartrate (LOPRESSOR) 25 MG tablet Take 1 tablet (25 mg total) by mouth 2 (two) times daily. 60 tablet 0  . omeprazole (PRILOSEC) 40 MG capsule Take 40 mg by mouth daily.    . pioglitazone (ACTOS) 45 MG tablet Take 45 mg by mouth daily.    . rosuvastatin (CRESTOR) 10 MG tablet Take 1 tablet (10 mg total) by mouth at bedtime. 30 tablet 0    ALLERGIES:   Allergies  Allergen Reactions  . Brimonidine Itching    Redness and itching eye Redness and itching Redness and itching  FAM HX: No family history on file.  Social History:   reports that she has never smoked. She has never used smokeless tobacco. No history on file for alcohol and drug.  ROS: 12 system ROS negative except per HPI above  Blood pressure 134/65, pulse 75, temperature 97.8 F (36.6 C), temperature source Oral, resp. rate 18, SpO2 100 %. PHYSICAL EXAM: Gen: awake in bed, a bit lethargic but is interactive  Eyes: anicteric, muddy sclera ENT: MM tacky Neck: supple, unable to assess JVD CV:  RRR, no rub Abd: soft, mild to mod distention, nontender with palpation Lungs: clear ant, normal WOB GU: no foley Extr: 2+ anasarca Neuro:  Follows commands, will converse with DIL Skin: warm and moist   Results for orders placed or performed during the hospital encounter of 12/19/18 (from the past 48 hour(s))  CBC with Differential      Status: Abnormal   Collection Time: 12/19/18  2:47 AM  Result Value Ref Range   WBC 11.7 (H) 4.0 - 10.5 K/uL   RBC 3.44 (L) 3.87 - 5.11 MIL/uL   Hemoglobin 10.2 (L) 12.0 - 15.0 g/dL   HCT 16.130.4 (L) 09.636.0 - 04.546.0 %   MCV 88.4 80.0 - 100.0 fL   MCH 29.7 26.0 - 34.0 pg   MCHC 33.6 30.0 - 36.0 g/dL   RDW 40.914.1 81.111.5 - 91.415.5 %   Platelets 217 150 - 400 K/uL   nRBC 0.0 0.0 - 0.2 %   Neutrophils Relative % 67 %   Neutro Abs 8.0 (H) 1.7 - 7.7 K/uL   Lymphocytes Relative 17 %   Lymphs Abs 2.0 0.7 - 4.0 K/uL   Monocytes Relative 14 %   Monocytes Absolute 1.6 (H) 0.1 - 1.0 K/uL   Eosinophils Relative 1 %   Eosinophils Absolute 0.1 0.0 - 0.5 K/uL   Basophils Relative 0 %   Basophils Absolute 0.0 0.0 - 0.1 K/uL   Immature Granulocytes 1 %   Abs Immature Granulocytes 0.10 (H) 0.00 - 0.07 K/uL    Comment: Performed at [facility], [address] [place], [place] [address]  Comprehensive metabolic panel     Status: Abnormal   Collection Time: 12/19/18  2:47 AM  Result Value Ref Range   Sodium 114 (LL) 135 - 145 mmol/L    Comment: CRITICAL RESULT CALLED TO, READ BACK BY AND VERIFIED WITH: JANINE NASH @ 0319 ON 12/19/2018 C VARNER    Potassium 5.2 (H) 3.5 - 5.1 mmol/L   Chloride 85 (L) 98 - 111 mmol/L   CO2 23 22 - 32 mmol/L   Glucose, Bld 233 (H) 70 - 99 mg/dL   BUN 14 8 - 23 mg/dL   Creatinine, Ser 6.211.33 (H) 0.44 - 1.00 mg/dL   Calcium 8.1 (L) 8.9 - 10.3 mg/dL   Total Protein 7.6 6.5 - 8.1 g/dL   Albumin 3.1 (L) 3.5 - 5.0 g/dL   AST 32 15 - 41 U/L   ALT 30 0 - 44 U/L   Alkaline Phosphatase 95 38 - 126 U/L   Total Bilirubin 0.7 0.3 - 1.2 mg/dL   GFR calc non Af Amer 38 (L) >60 mL/min   GFR calc Af Amer 44 (L) >60 mL/min   Anion gap 6 5 - 15    Comment: Performed at [facility], [address] [place], [place] [address]  Ammonia     Status: None   Collection Time: 12/19/18  2:47 AM  Result Value Ref  Range   Ammonia 13 9 - 35 umol/L    Comment:  Performed at [facility], [address], [place], [place] [address]  Lactic acid, plasma     Status: None   Collection Time: 12/19/18  2:47 AM  Result Value Ref Range   Lactic Acid, Venous 1.1 0.5 - 1.9 mmol/L    Comment: Performed at [facility], [address] [place], [place] [address]  Osmolality     Status: Abnormal   Collection Time: 12/19/18  3:27 AM  Result Value Ref Range   Osmolality 258 (L) 275 - 295 mOsm/kg    Comment: Performed at [facility], [address], [place], [place] [address]  Sodium     Status: Abnormal   Collection Time: 12/19/18  3:46 AM  Result Value Ref Range   Sodium 119 (LL) 135 - 145 mmol/L    Comment: CRITICAL RESULT CALLED TO, READ BACK BY AND VERIFIED WITH: [facility] @ 4696 ON 12/19/2018 Riesa Pope Performed at [facility], [address], [place], [place] [address]   Urinalysis, Routine w reflex microscopic     Status: Abnormal   Collection Time: 12/19/18  5:15 AM  Result Value Ref Range   Color, Urine YELLOW YELLOW   APPearance CLOUDY (A) CLEAR   Specific Gravity, Urine 1.020 1.005 - 1.030   pH 7.0 5.0 - 8.0   Glucose, UA 50 (A) NEGATIVE mg/dL   Hgb urine dipstick SMALL (A) NEGATIVE   Bilirubin Urine NEGATIVE NEGATIVE   Ketones, ur NEGATIVE NEGATIVE mg/dL   Protein, ur 413 (A) NEGATIVE mg/dL   Nitrite POSITIVE (A) NEGATIVE   Leukocytes,Ua LARGE (A) NEGATIVE   RBC / HPF 0-5 0 - 5 RBC/hpf   WBC, UA >50 (H) 0 - 5 WBC/hpf   Bacteria, UA MANY (A) NONE SEEN   Squamous Epithelial / LPF 0-5 0 - 5   WBC Clumps PRESENT     Comment: Performed at [facility], [address] [place], [place] [address]  Osmolality, urine     Status: Abnormal   Collection Time: 12/19/18  5:15 AM  Result Value Ref Range   Osmolality, Ur 283 (L) 300 - 900 mOsm/kg    Comment: Performed at [facility], [address], [place], [place] [address]  Sodium, urine, random      Status: None   Collection Time: 12/19/18  5:15 AM  Result Value Ref Range   Sodium, Ur 11 mmol/L    Comment: Performed at [facility], [address] [place], [place] [address]  Sodium     Status: Abnormal   Collection Time: 12/19/18  5:24 AM  Result Value Ref Range   Sodium 116 (LL) 135 - 145 mmol/L    Comment: CRITICAL RESULT CALLED TO, READ BACK BY AND VERIFIED WITH: COLES,L. RN AT 0604 12/19/18 MULLINS,T Performed at [facility], [address] [place], [place] [address]   Sodium     Status: Abnormal   Collection Time: 12/19/18  8:56 AM  Result Value Ref Range   Sodium 118 (LL) 135 - 145 mmol/L    Comment: CRITICAL RESULT CALLED TO, READ BACK BY AND VERIFIED WITH: H.COOPER RN AT 0935 ON 12/19/2018 BY S.VANHOORNE Performed at [facility], [address], [place], [place] [address]     Ct Abdomen Pelvis W Contrast  Result Date: 12/19/2018 CLINICAL DATA:  Abdominal distention EXAM: CT ABDOMEN AND  PELVIS WITH CONTRAST TECHNIQUE: Multidetector CT imaging of the abdomen and pelvis was performed using the standard protocol following bolus administration of intravenous contrast. CONTRAST:  80mL OMNIPAQUE IOHEXOL 300 MG/ML  SOLN COMPARISON:  06/07/2018 FINDINGS: Lower chest: Coronary calcification. Airway calcification collapse in the lower lobes that is partially visualized. Hepatobiliary: Cirrhotic liver with surface lobulation and fissures/caudate lobe enlargement. Apparent low-density in the right lobe liver is attributed to extensive streak artifact from jewelry -finding does not persist on delayed phase.Cholecystectomy with no bile duct dilatation. Pancreas: Unremarkable. Spleen: Unremarkable. Adrenals/Urinary Tract: Negative adrenals. No hydronephrosis or stone. Prominent bladder wall thickness likely related to under distension-no clear pericholecystic edema. Urinalysis has been ordered. Stomach/Bowel: Thickened proximal colon  with submucosal low-density appearance and mild pericolonic stranding. There is chronic mesenteric edema likely related to portal hypertension, but this focal colonic finding is new from prior. Minimal colonic diverticulosis. No appendicitis or obstruction. Vascular/Lymphatic: Extensive atherosclerotic calcification of the aorta and visceral branches. Lactic acid is normal. There is chronic haziness of the small bowel mesentery likely from portal hypertension or prior mesenteric adenitis. Reproductive:Unremarkable for age Other: Negative for ascites.  Anasarca Musculoskeletal: Remote T12 and L1 compression fractures. Remote L4 compression fracture. Generalized osteopenia. IMPRESSION: 1. Proximal colitis. 2. Cirrhosis. 3. Extensive atherosclerosis, especially of the SMA and its branches. Electronically Signed   By: Marnee SpringJonathon  Watts M.D.   On: 12/19/2018 04:24   Dg Chest Port 1 View  Result Date: 12/19/2018 CLINICAL DATA:  78 year old female with shortness of breath. EXAM: PORTABLE CHEST 1 VIEW COMPARISON:  Chest radiograph dated 06/18/2018 FINDINGS: Mild chronic interstitial coarsening and bronchitic changes. No focal consolidation, pleural effusion, or pneumothorax. Stable cardiac silhouette. Atherosclerotic calcification of the aortic arch. No acute osseous pathology. IMPRESSION: No active disease. Electronically Signed   By: Elgie CollardArash  Radparvar M.D.   On: 12/19/2018 02:45    Assessment/Plan **Hyponatremia:  Severe with acute on chronic; per care everywhere appear to have chronic mild hyponatremia with BL in the ~130 range. Per exam she is quite volume overloaded and this is consistent with hypervolemic hyponatremia most likely related to cirrhosis (though BP normal to high and no ascites).    Presumably her AMS is multifactorial (hyponatremia, acute infection, underlying cerebral atrophy; head CT pending) but this is certainly not helping.  On presentation 114 but on recheck 119 then 116 > 118.  Her AMS is  improving per her DIL and based on guidelines of 24h correction of serum sodium ~436mEq favor no hypertonic saline at this moment.    --cont q4h serum sodiums for now to ensure stable to uptrending.  If not will consider short course hypertonic saline.  -Currently NPO, if diet ordered please given fluid restriction 122600mL/day, 2g sodium diet -Strict I/Os (use purewick or even foley if needed to tract accurately), daily weights ordered  **anasarca:  Per above likely related to cirrhosis but a bit unusual with her high BP and lack of ascites.  TTE pending.  Sodium and fluid restriction when taking PO.  Likely will benefit from diuresis but will follow clinically through day prior to initiation.    **presumed UTI:  On ceftriaxone, culture pending.   **HTN: on home BB and amlodipine, ARB on hold for now.   **CKD3:  At baseline Cr 1.3.   **H/o seizure d/o:  Had seizure in similar setting, was started on keppra but d/c'd at neurology f/u.  Correcting underlying issues that may have promoted seizure in past.    Lori Hardy 12/19/2018, 12:14  PM

## 2018-12-19 NOTE — ED Notes (Signed)
XR at bedside

## 2018-12-19 NOTE — ED Notes (Signed)
Unable to collect temp and change into gown PT refuse

## 2018-12-19 NOTE — ED Notes (Signed)
Caryl Pina, RN made aware of sodium of 118.

## 2018-12-19 NOTE — Progress Notes (Signed)
Patients family notes patient speaks [place] dialect. Dialect not available via the iPad video translator but available via the audio line on the iPad as well as the [place] translator phone number. Will utilize translator for medical procedures but family currently translating for ADLs as patient altered and not responding to phone conversations.

## 2018-12-19 NOTE — ED Notes (Signed)
Date and time results received: 12/19/18  (use smartphrase ".now" to insert current time)  Test: Sodium  Critical Value: 118  Name of Provider Notified: Doctor, hospital

## 2018-12-19 NOTE — ED Notes (Signed)
Date and time results received: 12/19/18 0411   Test: sodium Critical Value: 119  Name of Provider Notified: Leonette Monarch, MD

## 2018-12-19 NOTE — ED Notes (Signed)
Pt aware that urine sample is needed.  

## 2018-12-19 NOTE — ED Provider Notes (Signed)
[facility]-EMERGENCY DEPT Provider Note   CSN: [id number] Arrival date & time: 12/19/18  0125    History   Chief Complaint Chief Complaint  Patient presents with   Abdominal Pain   Nausea    LEVEL 5 CAVEAT 2/2 ALTERED MENTAL STATUS  HPI Gennaro AfricaKamlaben Kimmet is a 78 y.o. female.     78 year old female with history of diabetes, hypertension, thrombocytopenia, liver cirrhosis, metabolic encephalopathy presents to the emergency department for evaluation of altered mental status.  Son is at bedside providing most of the history.  He states that she has been acutely confused since 2300 tonight.  Endorses repetitive questioning.  Symptoms preceded by abdominal distention which has been progressive over the past week.  She has continued to have bowel movements.  There are reports of nausea, the son denies vomiting.  He is concerned that her sodium may be low as she has had similar confusion with hyponatremia.  Was admitted for this in February 2020.  Son reporting surgical history of cholecystectomy.  The history is provided by a relative. No language interpreter was used.    Past Medical History:  Diagnosis Date   Aortic atherosclerosis (HCC) 12/15/2015   Overview:  By imaging, xray 11/2015.   Closed fracture of right proximal humerus 04/06/2017   Compression fracture of L4 lumbar vertebra, closed, initial encounter (HCC) 01/12/2017   Diabetes mellitus without complication (HCC)    Exposure to TB 09/26/2015   Functional diarrhea 11/20/2015   Overview:  Multifactorial and difficult to elucidate due to language barrier and cultural factors.  Differential includes diet, medications, possible pancreatic insufficiency.  Negative C. difficile 09/27/2015   H. pylori infection 10/07/2016   Overview:  BX confirmed. 09/15/2016- [facility].    History of compression fracture of spine 11/20/2015   Overview:  T12, on prolia.   History of Helicobacter pylori  infection 11/20/2015   Overview:  By EGD 2017.  Treated with 2 courses of antibiotics [facility].   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 No active disease on chest xray 11/25/15. Pt from [place], past exposure to TB. Pt agreed to referral and treatment by [facility]- referral sent 12/15/2015 (telephone [phone], fax #[phone]).    Metabolic encephalopathy    Polymyalgia (HCC) 01/12/2017   Postsurgical states following surgery of eye and adnexa 07/18/2015   Pseudophakia 06/11/2015   Seasonal allergic rhinitis 09/02/2016   Senile osteoporosis 06/26/2015   Overview:  Bone density 12/2014 [facility]. T-4.4 and -3.8. On prolia managed by Dr. Allena KatzPatel.  Normal intact PTH in 2017.   Thrombocytopenia (HCC) 09/26/2015   Xerosis of skin 04/14/2016    Patient Active Problem List   Diagnosis Date Noted   Benign essential HTN 07/12/2018   Acute blood loss anemia    Acute lower UTI    Hypoalbuminemia due to protein-calorie malnutrition (HCC)    Diabetes mellitus type 2 in nonobese [facility](HCC)    Metabolic encephalopathy 06/22/2018   Encephalopathy    Acute pyelonephritis    Debility    Anemia of chronic disease    CKD (chronic kidney disease), stage II    Hypertensive crisis    Sepsis (HCC) 06/19/2018   AMS (altered mental status) 06/18/2018  Weight loss 06/28/2017   Persistent proteinuria 06/28/2017   Anemia due to stage 3 chronic kidney disease (HCC) 03/23/2017   Positive ANA (antinuclear antibody) 02/15/2017   Posterior capsular opacification of right eye, obscuring vision 12/29/2016   No advance directive on file 03/16/2016   Need for hepatitis B vaccination 02/03/2016   Microalbuminuria due to type 2 diabetes  mellitus (HCC) 01/20/2016   Language barrier to communication 12/18/2015   Night sweats 11/20/2015   Acquired hypothyroidism 10/02/2015   Polyneuropathy due to type 2 diabetes mellitus (HCC) 09/26/2015   Migraine without aura and without status migrainosus, not intractable 09/26/2015   Diabetes mellitus (HCC) 08/13/2015   Gastro-esophageal reflux disease without esophagitis 08/13/2015   Hyperlipidemia 08/13/2015   Hypertension 08/13/2015   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      Home Medications    Prior to Admission medications   Medication Sig Start Date End Date Taking? Authorizing Provider  acetaminophen (TYLENOL) 325 MG tablet Take 2 tablets (650 mg total) by mouth every 6 (six) hours as needed for mild pain (or Fever >/= 101). Patient not taking: Reported on 11/14/2018 06/27/18   Angiulli, Mcarthur Rossettianiel J, PA-C  amLODipine (NORVASC) 5 MG tablet Take 1 tablet (5 mg total) by mouth daily. 06/27/18   Angiulli, Mcarthur Rossettianiel J, PA-C  Cholecalciferol (VITAMIN D3) 25 MCG (1000 UT) CAPS Take 1,000 mg by mouth daily.    [provider]  diphenoxylate-atropine (LOMOTIL) 2.5-0.025 MG tablet TAKE 1 TABLET BY MOUTH 4 TIMES DAILY AS NEEDED FOR UP TO 10 DAYS FOR DIARRHEA 10/05/18   [provider]  dorzolamide (TRUSOPT) 2 % ophthalmic solution Place 1 drop into the right eye 2 (two) times daily. 02/27/18   [provider]  glipiZIDE (GLUCOTROL) 10 MG tablet Take 10 mg by mouth 2 (two) times daily. 09/03/18   [provider]  insulin glargine (LANTUS) 100 unit/mL SOPN Inject 0.2 mLs (20 Units total) into the skin daily. 06/27/18   Angiulli, Mcarthur Rossettianiel J, PA-C  latanoprost (XALATAN) 0.005 %  ophthalmic solution Place 1 drop into both eyes at bedtime. 06/17/18   [provider]  levETIRAcetam (KEPPRA) 500 MG tablet Take 1 tablet (500 mg total) by mouth 2 (two) times daily. 06/27/18   Angiulli, Mcarthur Rossettianiel J, PA-C  levothyroxine (SYNTHROID, LEVOTHROID) 25 MCG tablet Take 1 tablet (25 mcg total) by mouth daily before breakfast. 06/27/18   Angiulli, Mcarthur Rossettianiel J, PA-C  loratadine (CLARITIN) 10 MG tablet Take 10 mg by mouth daily. 12/27/17   [provider]  losartan (COZAAR) 100 MG tablet Take 0.5 tablets (50 mg total) by mouth daily. 06/27/18   Angiulli, Mcarthur Rossettianiel J, PA-C  Melatonin 1 MG TABS Take 1 mg by mouth at bedtime.    [provider]  metoprolol tartrate (LOPRESSOR) 25 MG tablet Take 1 tablet (25 mg total) by mouth 2 (two) times daily. 06/27/18   Angiulli, Mcarthur Rossettianiel J, PA-C  Multiple Vitamin (MULTIVITAMIN WITH MINERALS) TABS tablet Take 1 tablet by mouth daily. 06/23/18   Melene PlanKim, Rachel E, MD  omeprazole (PRILOSEC) 40 MG capsule Take 40 mg by mouth daily.    [provider]  ondansetron (ZOFRAN) 4 MG tablet TAKE 1 TABLET BY MOUTH EVERY 8  HOURS AS NEEDED FOR NAUSEA AND VOMITING 10/19/18   [provider]  pioglitazone (ACTOS) 45 MG tablet Take 45 mg by mouth daily. 09/03/18   [provider]  rosuvastatin (CRESTOR) 10 MG tablet Take 1 tablet (10 mg total) by mouth at bedtime. 06/27/18   Angiulli, Lavon Paganini, PA-C  timolol (TIMOPTIC) 0.5 % ophthalmic solution Place 1 drop into the right eye daily. 02/27/18   [provider]    Family History No family history on file.  Social History Social History   Tobacco Use   Smoking status: Never Smoker   Smokeless tobacco: Never Used  Substance Use Topics   Alcohol use: Not on file   Drug use: Not on file     Allergies   Brimonidine   Review of Systems Review of Systems  Unable to perform ROS: Mental status change     Physical Exam Updated Vital Signs BP (!) 116/99 (BP Location:  Right Arm)    Pulse 80    Temp 97.8 F (36.6 C) (Oral)    Resp 18    SpO2 100%   Physical Exam Vitals signs and nursing note reviewed.  Constitutional:      General: She is not in acute distress.    Appearance: She is not diaphoretic.     Comments: Alert  HENT:     Head: Normocephalic and atraumatic.  Eyes:     General: No scleral icterus.    Conjunctiva/sclera: Conjunctivae normal.  Neck:     Musculoskeletal: Normal range of motion.  Cardiovascular:     Rate and Rhythm: Normal rate and regular rhythm.     Pulses: Normal pulses.  Pulmonary:     Effort: Pulmonary effort is normal. No respiratory distress.     Breath sounds: Wheezing (diffuse expiratory, mild) present.     Comments: No respiratory distress Abdominal:     Comments: Significant abdominal distention with decreased and absent bowel sounds throughout.  Not significantly tympanitic to percussion. No palpable masses.  Musculoskeletal: Normal range of motion.  Skin:    General: Skin is warm and dry.     Coloration: Skin is not pale.     Findings: No erythema or rash.  Neurological:     Mental Status: She is alert.     Comments: Moving all extremities spontaneously  Psychiatric:        Behavior: Behavior normal.      ED Treatments / Results  Labs (all labs ordered are listed, but only abnormal results are displayed) Labs Reviewed  CBC WITH DIFFERENTIAL/PLATELET - Abnormal; Notable for the following components:      Result Value   WBC 11.7 (*)    RBC 3.44 (*)    Hemoglobin 10.2 (*)    HCT 30.4 (*)    Neutro Abs 8.0 (*)    Monocytes Absolute 1.6 (*)    Abs Immature Granulocytes 0.10 (*)    All other components within normal limits  COMPREHENSIVE METABOLIC PANEL - Abnormal; Notable for the following components:   Sodium 114 (*)    Potassium 5.2 (*)    Chloride 85 (*)    Glucose, Bld 233 (*)    Creatinine, Ser 1.33 (*)    Calcium 8.1 (*)    Albumin 3.1 (*)    GFR calc non Af Amer 38 (*)    GFR calc Af  Amer 44 (*)    All other components within normal limits  SODIUM - Abnormal; Notable for the following components:  Sodium 119 (*)    All other components within normal limits  URINE CULTURE  SARS CORONAVIRUS 2  AMMONIA  LACTIC ACID, PLASMA  URINALYSIS, ROUTINE W REFLEX MICROSCOPIC  SODIUM  SODIUM  OSMOLALITY  OSMOLALITY, URINE  SODIUM, URINE, RANDOM    EKG None  Radiology Ct Abdomen Pelvis W Contrast  Result Date: 12/19/2018 CLINICAL DATA:  Abdominal distention EXAM: CT ABDOMEN AND PELVIS WITH CONTRAST TECHNIQUE: Multidetector CT imaging of the abdomen and pelvis was performed using the standard protocol following bolus administration of intravenous contrast. CONTRAST:  80mL OMNIPAQUE IOHEXOL 300 MG/ML  SOLN COMPARISON:  06/07/2018 FINDINGS: Lower chest: Coronary calcification. Airway calcification collapse in the lower lobes that is partially visualized. Hepatobiliary: Cirrhotic liver with surface lobulation and fissures/caudate lobe enlargement. Apparent low-density in the right lobe liver is attributed to extensive streak artifact from jewelry -finding does not persist on delayed phase.Cholecystectomy with no bile duct dilatation. Pancreas: Unremarkable. Spleen: Unremarkable. Adrenals/Urinary Tract: Negative adrenals. No hydronephrosis or stone. Prominent bladder wall thickness likely related to under distension-no clear pericholecystic edema. Urinalysis has been ordered. Stomach/Bowel: Thickened proximal colon with submucosal low-density appearance and mild pericolonic stranding. There is chronic mesenteric edema likely related to portal hypertension, but this focal colonic finding is new from prior. Minimal colonic diverticulosis. No appendicitis or obstruction. Vascular/Lymphatic: Extensive atherosclerotic calcification of the aorta and visceral branches. Lactic acid is normal. There is chronic haziness of the small bowel mesentery likely from portal hypertension or prior mesenteric  adenitis. Reproductive:Unremarkable for age Other: Negative for ascites.  Anasarca Musculoskeletal: Remote T12 and L1 compression fractures. Remote L4 compression fracture. Generalized osteopenia. IMPRESSION: 1. Proximal colitis. 2. Cirrhosis. 3. Extensive atherosclerosis, especially of the SMA and its branches. Electronically Signed   By: Marnee Spring M.D.   On: 12/19/2018 04:24   Dg Chest Port 1 View  Result Date: 12/19/2018 CLINICAL DATA:  78 year old female with shortness of breath. EXAM: PORTABLE CHEST 1 VIEW COMPARISON:  Chest radiograph dated 06/18/2018 FINDINGS: Mild chronic interstitial coarsening and bronchitic changes. No focal consolidation, pleural effusion, or pneumothorax. Stable cardiac silhouette. Atherosclerotic calcification of the aortic arch. No acute osseous pathology. IMPRESSION: No active disease. Electronically Signed   By: Elgie Collard M.D.   On: 12/19/2018 02:45    Procedures .Critical Care Performed by: Antony Madura, PA-C Authorized by: Antony Madura, PA-C   Critical care provider statement:    Critical care time (minutes):  60   Critical care was necessary to treat or prevent imminent or life-threatening deterioration of the following conditions:  Metabolic crisis   Critical care was time spent personally by me on the following activities:  Discussions with consultants, evaluation of patient's response to treatment, examination of patient, ordering and performing treatments and interventions, ordering and review of laboratory studies, ordering and review of radiographic studies, pulse oximetry, re-evaluation of patient's condition, obtaining history from patient or surrogate and review of old charts   (including critical care time)  Medications Ordered in ED Medications  sodium chloride (PF) 0.9 % injection (has no administration in time range)  ciprofloxacin (CIPRO) IVPB 400 mg (has no administration in time range)  metroNIDAZOLE (FLAGYL) IVPB 500 mg (has no  administration in time range)  ondansetron (ZOFRAN) injection 4 mg (4 mg Intravenous Given 12/19/18 0247)  0.9 %  sodium chloride infusion ( Intravenous Stopped 12/19/18 0335)  iohexol (OMNIPAQUE) 300 MG/ML solution 100 mL (80 mLs Intravenous Contrast Given 12/19/18 0356)      Initial Impression / Assessment and Plan /  ED Course  I have reviewed the triage vital signs and the nursing notes.  Pertinent labs & imaging results that were available during my care of the patient were reviewed by me and considered in my medical decision making (see chart for details).          3:53 AM Patient with acute hyponatremia.  Given acute confusion and sodium less than 120, will start on hypertonic saline.  On further review of care everywhere, patient does have a history of liver cirrhosis.  This is likely contributing to her low sodium.  Pending CT abdomen/pelvis for evaluation of distension.  Anticipate large volume ascites on imaging rather than obstruction as no vomiting and large BM while in the ED.  No fever or SIRS/sepsis criteria to raise concern for SBP.  4:21 AM  Repeat sodium check now recorded at 119.  To prevent rapid correction, will discontinue present order for hypertonic saline.  This plan was discussed with pharmacy.  Will proceed with q2 hour sodium checks.  Patient has completed CT abdomen/pelvis.  Pending results.   4:36 AM CT scan shows evidence of proximal colitis as well as cirrhosis.  Patient will be started on ciprofloxacin and Flagyl.  Consult placed to hospitalist for admission.  5:04 AM [facility]. Patient to be assessed by morning hospitalist team. VSS.   Final Clinical Impressions(s) / ED Diagnoses   Final diagnoses:  Metabolic encephalopathy  Hyponatremia  Colitis, acute    ED Discharge Orders    None       Antony MaduraHumes, Khalen Styer, PA-C 12/19/18 0509    Nira Connardama, Pedro Eduardo, MD 12/19/18 [phone]

## 2018-12-19 NOTE — H&P (Addendum)
[facility] History and Physical  Lori Hardy KGM:[id number] DOB: 1941/03/01 DOA: 12/19/2018  Referring physician: Sarita Haver, [facility] ED PCP: Theressa Millard, MD (Inactive)   Chief Complaint: Altered mental status, abd distension  HPI: Lori Hardy is a 78 y.o. female with hx of DM2, HTN and cirrhosis brought to ED by family w/ AMS, nausea and abd distension. Became confused last night around 11 pm.  Hx of low Na in the past, admitted for this in Feb 2020. W/U in ED showed Na+ 114, creat 1.33, K 5.2, Cl 85, CO2 23 and albumin 3.1. eGFR 38.  WBC 11  H10  plt 217. UA was cloudy, largeLE, +nitrite, 100 prot, many bact/ > 50 wbc, 0-5 rbc.  UNa 11 and UOsm 283. CT abd pelvis showed possible proximal colitis, cirrhosis and extensive atherosclerosis, especially of the SMA and its branches; also chronic haziness of the small bowel mesentery likely from portal hypertension or prior mesenteric adenitis; also chronic mesenteric edema likely related to portal hypertension; also diffuse anasarca and NO ascites. Orders were given for 3% saline but repeat Na+ at 4 am was 119 so this was cancelled.  We are asked to see for admission.   Pt was admitted here in Feb 2020 w/ AMS due to E. Faecalis UTI. She had seizure w/ postictal Todd's paresis likely provoked in setting of ^^BS, hypoNa+, UTI/ sepsis and brain atrophy. She was started on Keppra by neurology because EEG showed R hemispheric dysfunction and she would likely have ^'d seizure risk in the future..  IV then po abx for UTI. DM and HTN as well on medications.    Patient seen in ED, poor historian.  Daughter is here , states she has been swelling up for last 2-3 weeks, drinking a lot of water / juice, thirsty.  Has been confused for about 1-2 days.    Pt was last walking good in 2018, she had a fall and since then has been not walking much.  She walked a little in 2019 and now in 2020 she is pretty much WC dependent.  Has has some abd pain the last  few days, no diarrhea or N/V, no fevers.  Having headaches too, every day per the daughter.     ROS - n/a   Past Medical History  Past Medical History:  Diagnosis Date  . Aortic atherosclerosis ([place]) 12/15/2015   Overview:  By imaging, xray 11/2015.  . Closed fracture of right proximal humerus 04/06/2017  . Compression fracture of L4 lumbar vertebra, closed, initial encounter ([place]) 01/12/2017  . Diabetes mellitus without complication ([place])   . Exposure to TB 09/26/2015  . 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  Differential includes diet, medications, possible pancreatic insufficiency.  Negative C. difficile 09/27/2015  . H. pylori infection 10/07/2016   Overview:  BX confirmed. 09/15/2016- Dr. Ferdinand Lango, [facility].   Marland Kitchen History of compression fracture of spine 11/20/2015   Overview:  T12, on prolia.  Marland Kitchen History of Helicobacter pylori infection 11/20/2015   Overview:  By EGD 2017.  Treated with 2 courses of antibiotics at [facility].  . Hx of pancreatitis 09/26/2015   Overview:  08/2015 [facility]  . Hypertension   . Hyponatremia 04/18/2017  . 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 No active  disease on chest xray 11/25/15. Pt from [place], past exposure to TB. Pt agreed to referral and treatment by [facility]- referral sent 12/15/2015 (telephone [phone], fax [phone]).   . Metabolic encephalopathy   . Polymyalgia ([place]) 01/12/2017  . 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 Pseudophakia 06/11/2015  . Seasonal allergic rhinitis 09/02/2016  . Senile osteoporosis 06/26/2015   Overview:  Bone density 12/2014 [facility]. T-4.4 and -3.8. On prolia managed by Dr. Posey Pronto.  Normal intact PTH in 2017.  Marland Kitchen Thrombocytopenia ([place])  09/26/2015  . Xerosis of skin 04/14/2016   Past Surgical History  Past Surgical History:  Procedure Laterality Date  . cataracts     Family History No family history on file. Social History  reports that she has never smoked. She has never used smokeless tobacco. No history on file for alcohol and drug. Allergies  Allergies  Allergen Reactions  . Brimonidine Itching    Redness and itching eye Redness and itching Redness and itching    Home medications Prior to Admission medications   Medication Sig Start Date End Date Taking? Authorizing Provider  acetaminophen (TYLENOL) 325 MG tablet Take 2 tablets (650 mg total) by mouth every 6 (six) hours as needed for mild pain (or Fever >/= 101). Patient not taking: Reported on 11/14/2018 06/27/18   Angiulli, Lavon Paganini, PA-C  amLODipine (NORVASC) 5 MG tablet Take 1 tablet (5 mg total) by mouth daily. 06/27/18   Angiulli, Lavon Paganini, PA-C  glipiZIDE (GLUCOTROL) 10 MG tablet Take 10 mg by mouth 2 (two) times daily. 09/03/18   [provider]  Insulin Glargine (BASAGLAR KWIKPEN) 100 UNIT/ML SOPN Inject 14 Units into the skin daily. 11/27/18   [provider]  insulin glargine (LANTUS) 100 unit/mL SOPN Inject 0.2 mLs (20 Units total) into the skin daily. Patient not taking: Reported on 12/19/2018 06/27/18   Angiulli, Lavon Paganini, PA-C  latanoprost (XALATAN) 0.005 % ophthalmic solution Place 1 drop into both eyes at bedtime. 06/17/18   [provider]  levETIRAcetam (KEPPRA) 500 MG tablet Take 1 tablet (500 mg total) by mouth 2 (two) times daily. Patient not taking: Reported on 12/19/2018 06/27/18   Angiulli, Lavon Paganini, PA-C  levothyroxine (SYNTHROID, LEVOTHROID) 25 MCG tablet Take 1 tablet (25 mcg total) by mouth daily before breakfast. 06/27/18   Angiulli, Lavon Paganini, PA-C  losartan (COZAAR) 100 MG tablet Take 0.5 tablets (50 mg total) by mouth daily. Patient taking differently: Take 100 mg by mouth daily.  06/27/18   Angiulli, Lavon Paganini, PA-C   metoprolol tartrate (LOPRESSOR) 25 MG tablet Take 1 tablet (25 mg total) by mouth 2 (two) times daily. 06/27/18   Angiulli, Lavon Paganini, PA-C  Multiple Vitamin (MULTIVITAMIN WITH MINERALS) TABS tablet Take 1 tablet by mouth daily. Patient not taking: Reported on 12/19/2018 06/23/18   Wilber Oliphant, MD  omeprazole (PRILOSEC) 40 MG capsule Take 40 mg by mouth daily.    [provider]  pioglitazone (ACTOS) 45 MG tablet Take 45 mg by mouth daily. 09/03/18   [provider]  rosuvastatin (CRESTOR) 10 MG tablet Take 1 tablet (10 mg total) by mouth at bedtime. 06/27/18   Cathlyn Parsons, PA-C   Liver Function Tests Recent Labs  Lab 12/19/18 0247  AST 32  ALT 30  ALKPHOS 95  BILITOT 0.7  PROT 7.6  ALBUMIN 3.1*   No results for input(s): LIPASE, AMYLASE in the last 168 hours. CBC Recent Labs  Lab 12/19/18  0247  WBC 11.7*  NEUTROABS 8.0*  HGB 10.2*  HCT 30.4*  MCV 88.4  PLT 505   Basic Metabolic Panel Recent Labs  Lab 12/19/18 0247 12/19/18 0346 12/19/18 0524 12/19/18 0856  NA 114* 119* 116* 118*  K 5.2*  --   --   --   CL 85*  --   --   --   CO2 23  --   --   --   GLUCOSE 233*  --   --   --   BUN 14  --   --   --   CREATININE 1.33*  --   --   --   CALCIUM 8.1*  --   --   --      Vitals:   12/19/18 0630 12/19/18 0700 12/19/18 0730 12/19/18 0800  BP: (!) 168/77 (!) 158/58 (!) 133/54 (!) 109/93  Pulse: 71  74   Resp: (!) 21 15 15 14   Temp:      TempSrc:      SpO2: 100%  100% 100%   Exam: Gen elderly [place] female somnolent, arousable, no jerking/ asterixis Follows some simple commands Moves bilat UE's, moves feet minimally No rash, cyanosis or gangrene Sclera anicteric, throat clear  No jvd or bruits Chest clear bilat to bases, good air movement RRR no MRG Abd protuberant, somewhat firm, nontender, +BS, no hsm or ascites GU defer MS no joint effusions or deformity Ext diffuse 2+ pitting edema of all extremities Legs> arms, +hip edema/ no   wounds or ulcers noted Neuro as above     Home meds:  - amlodipine 5 qd/ losartan 100 qd/ metoprolol 25 bid  - pioglitazone 45 qd/ insulin glargine 20u qd/ glipizide 10 bid  - levetiracetam 500 bid  - levothyroxine 25 ug qd/ MVI/ omeprazole 40 qd  - rosuvastatin 10 hs   BP 148/ 68, HR 80 and RR 15- 18   Na+ 114 Cr 1.33  K 5.2  Cl 85  CO2 23   albumin 3.1   eGFR 38  WBC 11  H10  plt 217  UA - cloudy, largeLE, +nitrite, 100 prot, many bact/ > 50 wbc, 0-5 rbc.     UNa 11 UOsm 283  CT abd pelvis > IMPRESSION: possible proximal colitis, cirrhosis and extensive atherosclerosis, especially of the SMA and its branches; also chronic haziness of the small bowel mesentery likely from portal hypertension or prior mesenteric adenitis; also chronic mesenteric edema likely related to portal hypertension; also diffuse anasarca and NO ascites.  CXR (independ reviewed) > no active disease  Assessment: 1. Altered mental status - nonfocal, somnolent, chronic debility. May be due to hyponatremia. NH3 is wnl. +headaches, BP's not bad.  Check head CT, treat hyponatremia.   2. Hyponatremia - acute on chronic issue, is supposed to be on fluid restriction at home per renal at [facility], but has been "drinking a lot" the last few wks at home per family.  Here w/ diffuse anasarca/ edema, clear CXR, no hypoxemia. +h/o cirrhosis, alb 3.1, creat 1.3, eGFR 38. No echo on file. Will consult renal service.   3. Pyuria - no fever, WBC up 11k. Urine cx sent, start IV Rocephin 4. Cirrhosis - CT shows some signs of portal HTN possibly, however no ascites on CT. Abd distension due to adipose and some abd wall edema. NH3 wnl, no hx of cirrhosis complications  5. Anasarca - not sure cirrhosis vs CHF. Albumin not very low. Check echo. D/w renal. 6. "  Colitis" - possible by CT scan, no diarrhea or abd tenderness, will hold off on treatment for this right now.   7. DM2 - will hold lantus 14 and po home meds, use SSI for now 8. CKD 3 -  f/b [facility]. Creat 1.3 here w eGFR 40m/min, baseline 1.0- 1.3 per [facility] notes 9. HTN - cont metop/ amlodipine here, hold losartan for now 10. H/o seizures - last admit in Jan 2020, placed on Keppra but then dc'd at neuro f/u visit    Plan - as above   DVT prophylaxis: SCD's pending head CT Family communication: daughter at bedside Code status: full Admit status: INP Bed type: SDU    RKelly SplinterMD  [facility]  pgr [phone] 41328/03/2019, 10:06 AM  If 7PM-7AM, please contact night-coverage [email or web address] Password [email or web address] 12/19/2018, 10:06 AM

## 2018-12-19 NOTE — ED Notes (Signed)
Patient transported to CT 

## 2018-12-19 NOTE — ED Notes (Signed)
Date and time results received: 12/19/18 0319 (use smartphrase ".now" to insert current time)  Test: NA+ Critical Value: 114  Name of Provider Notified: Cardama MD  Orders Received? Or Actions Taken?: Waiting on order

## 2018-12-19 NOTE — ED Notes (Addendum)
Date and time results received: 12/19/18 "6:05 AM  (use smartphrase ".now" to insert current time)  Test: Sodium Critical Value: 116  Name of Provider Notified: Hal Hope, MD  Orders Received? Or Actions Taken?: Will follow up

## 2018-12-19 NOTE — ED Triage Notes (Signed)
Speaks Gujarati. Patient is from home with complaints of abd distention x1 week. Nausea.

## 2018-12-19 NOTE — ED Notes (Signed)
hospitalist at bedside

## 2018-12-19 NOTE — ED Notes (Signed)
Date and time results received: 12/19/18 0935  (use smartphrase ".now" to insert current time)  Test: Sodium Critical Value: 118  Name of Provider Notified: Caryl Pina, RN  Orders Received? Or Actions Taken?

## 2018-12-19 NOTE — ED Notes (Signed)
Visitor at bedside.

## 2018-12-20 ENCOUNTER — Inpatient Hospital Stay (HOSPITAL_COMMUNITY): Payer: Medicare Other

## 2018-12-20 ENCOUNTER — Other Ambulatory Visit: Payer: Self-pay

## 2018-12-20 ENCOUNTER — Encounter (HOSPITAL_COMMUNITY): Payer: Self-pay

## 2018-12-20 DIAGNOSIS — E871 Hypo-osmolality and hyponatremia: Secondary | ICD-10-CM | POA: Diagnosis not present

## 2018-12-20 DIAGNOSIS — E039 Hypothyroidism, unspecified: Secondary | ICD-10-CM | POA: Diagnosis not present

## 2018-12-20 DIAGNOSIS — I5031 Acute diastolic (congestive) heart failure: Secondary | ICD-10-CM | POA: Diagnosis not present

## 2018-12-20 DIAGNOSIS — L8990 Pressure ulcer of unspecified site, unspecified stage: Secondary | ICD-10-CM | POA: Insufficient documentation

## 2018-12-20 DIAGNOSIS — G9341 Metabolic encephalopathy: Secondary | ICD-10-CM | POA: Diagnosis not present

## 2018-12-20 LAB — BASIC METABOLIC PANEL
Anion gap: 8 (ref 5–15)
BUN: 12 mg/dL (ref 8–23)
CO2: 22 mmol/L (ref 22–32)
Calcium: 8.3 mg/dL — ABNORMAL LOW (ref 8.9–10.3)
Chloride: 94 mmol/L — ABNORMAL LOW (ref 98–111)
Creatinine, Ser: 1.36 mg/dL — ABNORMAL HIGH (ref 0.44–1.00)
GFR calc Af Amer: 43 mL/min — ABNORMAL LOW (ref >60)
GFR calc non Af Amer: 37 mL/min — ABNORMAL LOW (ref >60)
Glucose, Bld: 79 mg/dL (ref 70–99)
Potassium: 4.7 mmol/L (ref 3.5–5.1)
Sodium: 124 mmol/L — ABNORMAL LOW (ref 135–145)

## 2018-12-20 LAB — CBC
HCT: 32.2 % — ABNORMAL LOW (ref 36.0–46.0)
Hemoglobin: 10.5 g/dL — ABNORMAL LOW (ref 12.0–15.0)
MCH: 29.4 pg (ref 26.0–34.0)
MCHC: 32.6 g/dL (ref 30.0–36.0)
MCV: 90.2 fL (ref 80.0–100.0)
Platelets: 190 10*3/uL (ref 150–400)
RBC: 3.57 MIL/uL — ABNORMAL LOW (ref 3.87–5.11)
RDW: 14.1 % (ref 11.5–15.5)
WBC: 9.5 10*3/uL (ref 4.0–10.5)
nRBC: 0 % (ref 0.0–0.2)

## 2018-12-20 LAB — GLUCOSE, CAPILLARY
Glucose-Capillary: 71 mg/dL (ref 70–99)
Glucose-Capillary: 72 mg/dL (ref 70–99)
Glucose-Capillary: 93 mg/dL (ref 70–99)
Glucose-Capillary: 88 mg/dL (ref 70–99)
Glucose-Capillary: 144 mg/dL — ABNORMAL HIGH (ref 70–99)
Glucose-Capillary: 146 mg/dL — ABNORMAL HIGH (ref 70–99)

## 2018-12-20 LAB — ECHOCARDIOGRAM COMPLETE
Height: 63 in
Weight: 2839.52 oz

## 2018-12-20 MED ORDER — SIMETHICONE 80 MG PO CHEW
80.0000 mg | CHEWABLE_TABLET | Freq: Four times a day (QID) | ORAL | Status: DC | PRN
  Administered 2018-12-20 – 2018-12-21 (×2): 80 mg via ORAL
  Filled 2018-12-20 (×2): qty 1

## 2018-12-20 MED ORDER — FUROSEMIDE 10 MG/ML IJ SOLN
20.0000 mg | Freq: Once | INTRAMUSCULAR | Status: AC
  Administered 2018-12-20: 12:00:00 20 mg via INTRAVENOUS
  Filled 2018-12-20: qty 2

## 2018-12-20 MED ORDER — ENOXAPARIN SODIUM 40 MG/0.4ML ~~LOC~~ SOLN
40.0000 mg | SUBCUTANEOUS | Status: DC
  Administered 2018-12-20 – 2018-12-24 (×5): 40 mg via SUBCUTANEOUS
  Filled 2018-12-20 (×5): qty 0.4

## 2018-12-20 NOTE — Progress Notes (Signed)
[facility] Progress Note   Subjective:   Son at bedside - feels she's globally improved but not back to baseline.  No new symptoms.  Still with mild generalized abd pain.  Has been NPO since admission.  I/Os 173mL/1750 UOP.   Objective Vitals:   12/20/18 0500 12/20/18 0600 12/20/18 0800 12/20/18 0802  BP: (!) 122/94 (!) 146/62 (!) 146/113 (!) 146/113  Pulse: 81  81 81  Resp: 13 14 14    Temp:   99.6 F (37.6 C)   TempSrc:   Axillary   SpO2: 100%  99%   Weight: 80.5 kg     Height:       Physical Exam General: more awake today, lying at 45degrees in no distress Heart: RRR, no rub Lungs: clear ant Abdomen: soft, nontender with palpation Extremities: trace LE edema now  Additional Objective Labs: Basic Metabolic Panel: Recent Labs  Lab 12/19/18 0247  12/19/18 1430 12/19/18 1802 12/20/18 0222  NA 114*   < > 120* 122* 124*  K 5.2*  --   --   --  4.7  CL 85*  --   --   --  94*  CO2 23  --   --   --  22  GLUCOSE 233*  --   --   --  79  BUN 14  --   --   --  12  CREATININE 1.33*  --   --   --  1.36*  CALCIUM 8.1*  --   --   --  8.3*   < > = values in this interval not displayed.   Liver Function Tests: Recent Labs  Lab 12/19/18 0247  AST 32  ALT 30  ALKPHOS 95  BILITOT 0.7  PROT 7.6  ALBUMIN 3.1*   No results for input(s): LIPASE, AMYLASE in the last 168 hours. CBC: Recent Labs  Lab 12/19/18 0247 12/20/18 0222  WBC 11.7* 9.5  NEUTROABS 8.0*  --   HGB 10.2* 10.5*  HCT 30.4* 32.2*  MCV 88.4 90.2  PLT 217 190   Blood Culture    Component Value Date/Time   SDES  12/19/2018 0515    URINE, CATHETERIZED Performed at [facility], [place] [address] [place], [place] [address]    SPECREQUEST  12/19/2018 0515    NONE Performed at [facility], [place] [address], [place], [place] [address]    CULT (A) 12/19/2018 0515    >=100,000 COLONIES/mL ESCHERICHIA COLI SUSCEPTIBILITIES TO FOLLOW Performed at [facility] [address] [place], [place] [address]    REPTSTATUS PENDING 12/19/2018 0515    Cardiac Enzymes: No results for input(s): CKTOTAL, CKMB, CKMBINDEX, TROPONINI in the last 168 hours. CBG: Recent Labs  Lab 12/19/18 1532 12/19/18 1916 12/19/18 2349 12/20/18 0333 12/20/18 0727  GLUCAP 94 76 85 71 72   Iron Studies: No results for input(s): IRON, TIBC, TRANSFERRIN, FERRITIN in the last 72 hours. @lablastinr3 @ Studies/Results: Ct Head Wo Contrast  Result Date: 12/19/2018 CLINICAL DATA:  78 year old female with altered level of consciousness, unexplained. EXAM: CT HEAD WITHOUT CONTRAST TECHNIQUE: Contiguous axial images were obtained from the base of the skull through the vertex without intravenous contrast. COMPARISON:  06/18/2018 FINDINGS: Brain: No evidence of acute infarction, hemorrhage, hydrocephalus, extra-axial collection or mass lesion/mass effect. There is diffuse low-attenuation within the subcortical and periventricular white matter compatible with chronic microvascular disease. Prominence of the sulci and ventricles compatible with brain atrophy. Vascular: No hyperdense vessel or  unexpected calcification. Skull: Normal. Negative for fracture or focal lesion. Sinuses/Orbits: No acute finding. Partial opacification of the left mastoid air cells. Other: None IMPRESSION: No acute intracranial abnormalities. 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 Electronically Signed   By: Signa Kellaylor  Stroud M.D.   On: 12/19/2018 16:29   Ct Abdomen Pelvis W Contrast  Result Date: 12/19/2018 CLINICAL DATA:  Abdominal distention EXAM: CT ABDOMEN AND PELVIS WITH CONTRAST TECHNIQUE: Multidetector CT imaging of the abdomen and pelvis was performed using the standard protocol following bolus administration of intravenous contrast. CONTRAST:  80mL OMNIPAQUE IOHEXOL 300 MG/ML  SOLN COMPARISON:  06/07/2018 FINDINGS: Lower chest: Coronary calcification. Airway calcification  collapse in the lower lobes that is partially visualized. Hepatobiliary: Cirrhotic liver with surface lobulation and fissures/caudate lobe enlargement. Apparent low-density in the right lobe liver is attributed to extensive streak artifact from jewelry -finding does not persist on delayed phase.Cholecystectomy with no bile duct dilatation. Pancreas: Unremarkable. Spleen: Unremarkable. Adrenals/Urinary Tract: Negative adrenals. No hydronephrosis or stone. Prominent bladder wall thickness likely related to under distension-no clear pericholecystic edema. Urinalysis has been ordered. Stomach/Bowel: Thickened proximal colon with submucosal low-density appearance and mild pericolonic stranding. There is chronic mesenteric edema likely related to portal hypertension, but this focal colonic finding is new from prior. Minimal colonic diverticulosis. No appendicitis or obstruction. Vascular/Lymphatic: Extensive atherosclerotic calcification of the aorta and visceral branches. Lactic acid is normal. There is chronic haziness of the small bowel mesentery likely from portal hypertension or prior mesenteric adenitis. Reproductive:Unremarkable for age Other: Negative for ascites.  Anasarca Musculoskeletal: Remote T12 and L1 compression fractures. Remote L4 compression fracture. Generalized osteopenia. IMPRESSION: 1. Proximal colitis. 2. Cirrhosis. 3. Extensive atherosclerosis, especially of the SMA and its branches. Electronically Signed   By: Marnee SpringJonathon  Watts M.D.   On: 12/19/2018 04:24   Dg Chest Port 1 View  Result Date: 12/19/2018 CLINICAL DATA:  78 year old female with shortness of breath. EXAM: PORTABLE CHEST 1 VIEW COMPARISON:  Chest radiograph dated 06/18/2018 FINDINGS: Mild chronic interstitial coarsening and bronchitic changes. No focal consolidation, pleural effusion, or pneumothorax. Stable cardiac silhouette. Atherosclerotic calcification of the aortic arch. No acute osseous pathology. IMPRESSION: No active  disease. Electronically Signed   By: Elgie CollardArash  Radparvar M.D.   On: 12/19/2018 02:45   Medications: . sodium chloride    . cefTRIAXone (ROCEPHIN)  IV Stopped (12/19/18 1305)   . amLODipine  5 mg Oral Daily  . Chlorhexidine Gluconate Cloth  6 each Topical Daily  . enoxaparin (LOVENOX) injection  40 mg Subcutaneous Q24H  . insulin aspart  0-9 Units Subcutaneous Q4H  . latanoprost  1 drop Both Eyes QHS  . levothyroxine  25 mcg Oral QAC breakfast  . metoprolol tartrate  25 mg Oral BID  . pantoprazole  40 mg Oral Daily  . rosuvastatin  10 mg Oral QHS  . sodium chloride flush  3 mL Intravenous Q12H    Assessment/Plan: **Hyponatremia:  Severe with acute on chronic; per care everywhere appear to have chronic mild hyponatremia with BL in the ~130 range. Per exam she is quite volume overloaded and this is consistent with hypervolemic hyponatremia most likely related to cirrhosis (though BP normal to high and no ascites).    Presumably her AMS is multifactorial (hyponatremia, acute infection, underlying cerebral atrophy; head CT with no acute changes) but this is certainly not helping.   With fluid restriction alone has trended up to now 124 on last check.  She appears to be globally improving.   --  Check 2pm sodium --Resume diet with fluid restriction 125000mL/day, 2g sodium diet -Strict I/Os (use purewick or even foley if needed to tract accurately), daily weights ordered  **anasarca:  Per above likely related to cirrhosis but a bit unusual with her high BP and lack of ascites.  TTE pending.  Sodium and fluid restriction when taking PO.  --lasix 20mg  IV today  --May need diuretic at discharge to maintain euvolemia but may just need fluid and sodium restriction, TBD  **presumed UTI:  On ceftriaxone, culture pending.   **HTN: on home BB and amlodipine, ARB on hold for now.   **CKD3:  At baseline Cr 1.3.   **H/o seizure d/o:  Had seizure in similar setting, was started on keppra but d/c'd  at neurology f/u.  Correcting underlying issues that may have promoted seizure in past.   Estill BakesLindsay Kruska MD 12/20/2018, 11:34 AM   [facility] Pager: [phone]

## 2018-12-20 NOTE — Progress Notes (Signed)
Jewelry taken off of patient and given to family to go home. Unable to remove bracelet and ring from patient finger.

## 2018-12-20 NOTE — Progress Notes (Signed)
PROGRESS NOTE  Lori Hardy MEQ:[id number] DOB: [birth year] DOA: 12/19/2018 PCP: Theressa Millard, MD (Inactive)  HPI/Recap of past 24 hours:  Lori Hardy is a 78 y.o. female with hx of DM2, HTN and cirrhosis brought to ED by family w/ AMS, nausea and abd distension. Became confused last night around 11 pm.  Hx of low Na in the past, admitted for this in Feb 2020. W/U in ED showed Na+ 114, creat 1.33, K 5.2, Cl 85, CO2 23 and albumin 3.1. eGFR 38.  WBC 11  H10  plt 217. UA was cloudy, largeLE, +nitrite, 100 prot, many bact/ > 50 wbc, 0-5 rbc.  UNa 11 and UOsm 283. CT abd pelvis showed possible proximal colitis, cirrhosis and extensive atherosclerosis, especially of the SMA and its branches; also chronic haziness of the small bowel mesentery likely from portal hypertension or prior mesenteric adenitis; also chronic mesenteric edema likely related to portal hypertension; also diffuse anasarca and NO ascites. Orders were given for 3% saline but repeat Na+ at 4 am was 119 so this was cancelled.  We are asked to see for admission.   Pt was admitted here in Feb 2020 w/ AMS due to E. Faecalis UTI. She had seizure w/ postictal Todd's paresis likely provoked in setting of ^^BS, hypoNa+, UTI/ sepsis and brain atrophy. She was started on Keppra by neurology because EEG showed R hemispheric dysfunction and she would likely have ^'d seizure risk in the future..  IV then po abx for UTI. DM and HTN as well on medications.    Patient seen in ED, poor historian.  Daughter is here , states she has been swelling up for last 2-3 weeks, drinking a lot of water / juice, thirsty.  Has been confused for about 1-2 days.    Pt was last walking good in 2018, she had a fall and since then has been not walking much.  She walked a little in 2019 and now in 2020 she is pretty much WC dependent.  Has has some abd pain the last few days, no diarrhea or N/V, no fevers.  Having headaches too, every day per the daughter.    12/20/18: Patient was seen and examined at her bedside.  She does not appear in distress.  Unable to obtain a history or review of systems due to language barrier.  Will call her family.  Vital signs noted for uncontrolled hypertension.  Lab studies noted for persistent hyponatremia from 114 on 12/19/2018 to 124 on 12/20/2018.   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)   Cryptogenic cirrhosis of liver (HCC)   CKD (chronic kidney disease), stage III (HCC)   Encephalopathy   Debility   Diabetes mellitus type 2 in nonobese (HCC)   Benign essential HTN   Hyponatremia   Anasarca   Volume overload   Hyperkalemia   Pyuria   Acute metabolic encephalopathy likely secondary to severe hyponatremia versus others Presented with sodium level of 114 Sodium level improving to 124 Continue strict fluid intake and sodium restriction less than 2 g/day Repeat BMP  Severe acute on chronic hypervolemic hyponatremia likely in the setting of over consumption of fluid Patient is being followed by nephrology and supposed to be on fluid restriction at home per renal at [facility] Per family has been drinking a lot of fluid at home Continue management as stated above  Anasarca, unclear etiology Cirrhosis versus  CHF Obtain BNP Obtain LFTs CT abdomen and pelvis with contrast done on 12/19/18 shows cirrhotic liver with surface lobulation and fissures/caudate lobe enlargement Persistent hypervolemia on exam Continue fluid and salt restriction  Pyuria with suspected UTI Urine culture is in process If urine culture is negative, UTI will be ruled out Follow cultures  CKD 3 Appears to be on her baseline creatinine 1.3 with GFR 46 Continue to avoid nephrotoxins Monitor urine output  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 Hemoglobin A1c 9.7 on 06/20/2018 Continue insulin sliding scale  Proximal  colitis, possible by CT scan Asymptomatic and exam with no abdominal tenderness on palpation Close monitoring  Uncontrolled hypertension Continue home metoprolol and amlodipine Losartan held for now Continue to closely monitor vital signs IV hydralazine as needed for SBP greater than 180  History of seizures During her last admission in January 2020 she was placed on Keppra but this has been discontinued at  neurology office.'  Physical debility/ambulatory dysfunction PT OT to assess Fall precautions  Risks: Patient is high risk for decompensation due to persistent hyponatremia, anasarca, multiple comorbidities and advanced age.  Patient will require at least 2 midnights for further evaluation and treatment of present condition.   DVT prophylaxis:  Subcu Lovenox daily Family communication:  Family not at bedside this morning.  We will call family. Code status: full Disposition: Discharge to home versus SNF possibly in 2 days once sodium level is close to baseline.    Objective: Vitals:   12/20/18 0400 12/20/18 0500 12/20/18 0600 12/20/18 0802  BP: (!) 140/54 (!) 122/94 (!) 146/62 (!) 146/113  Pulse: 76 81  81  Resp: 14 13 14    Temp:      TempSrc:      SpO2: 100% 100%    Weight:  80.5 kg    Height:        Intake/Output Summary (Last 24 hours) at 12/20/2018 0830 Last data filed at 12/20/2018 0500 Gross per 24 hour  Intake 109.29 ml  Output 1750 ml  Net -1640.71 ml   Filed Weights   12/20/18 0500  Weight: 80.5 kg    Exam:  . General: 78 y.o. year-old female well developed well nourished in no acute distress.  Alert.  Talkative in another language. . Cardiovascular: Regular rate and rhythm with no rubs or gallops.  No thyromegaly or JVD noted.   Marland Kitchen Respiratory: Clear to auscultation with no wheezes or rales. Good inspiratory effort. . Abdomen: Soft nontender nondistended with normal bowel sounds x4 quadrants. . Musculoskeletal: Dependent edema affecting upper and  lower extremities.  Bilaterally. Marland Kitchen Psychiatry: Mood is appropriate for condition and setting   Data Reviewed: CBC: Recent Labs  Lab 12/19/18 0247 12/20/18 0222  WBC 11.7* 9.5  NEUTROABS 8.0*  --   HGB 10.2* 10.5*  HCT 30.4* 32.2*  MCV 88.4 90.2  PLT 217 962   Basic Metabolic Panel: Recent Labs  Lab 12/19/18 0247  12/19/18 0524 12/19/18 0856 12/19/18 1430 12/19/18 1802 12/20/18 0222  NA 114*   < > 116* 118* 120* 122* 124*  K 5.2*  --   --   --   --   --  4.7  CL 85*  --   --   --   --   --  94*  CO2 23  --   --   --   --   --  22  GLUCOSE 233*  --   --   --   --   --  79  BUN 14  --   --   --   --   --  12  CREATININE 1.33*  --   --   --   --   --  1.36*  CALCIUM 8.1*  --   --   --   --   --  8.3*   < > = values in this interval not displayed.   GFR: Estimated Creatinine Clearance: 33.7 mL/min (A) (by C-G formula based on SCr of 1.36 mg/dL (H)). Liver Function Tests: Recent Labs  Lab 12/19/18 0247  AST 32  ALT 30  ALKPHOS 95  BILITOT 0.7  PROT 7.6  ALBUMIN 3.1*   No results for input(s): LIPASE, AMYLASE in the last 168 hours. Recent Labs  Lab 12/19/18 0247  AMMONIA 13   Coagulation Profile: No results for input(s): INR, PROTIME in the last 168 hours. Cardiac Enzymes: No results for input(s): CKTOTAL, CKMB, CKMBINDEX, TROPONINI in the last 168 hours. BNP (last 3 results) No results for input(s): PROBNP in the last 8760 hours. HbA1C: No results for input(s): HGBA1C in the last 72 hours. CBG: Recent Labs  Lab 12/19/18 1532 12/19/18 1916 12/19/18 2349 12/20/18 0333 12/20/18 0727  GLUCAP 94 76 85 71 72   Lipid Profile: No results for input(s): CHOL, HDL, LDLCALC, TRIG, CHOLHDL, LDLDIRECT in the last 72 hours. Thyroid Function Tests: No results for input(s): TSH, T4TOTAL, FREET4, T3FREE, THYROIDAB in the last 72 hours. Anemia Panel: No results for input(s): VITAMINB12, FOLATE, FERRITIN, TIBC, IRON, RETICCTPCT in the last 72 hours. Urine  analysis:    Component Value Date/Time   COLORURINE YELLOW 12/19/2018 0515   APPEARANCEUR CLOUDY (A) 12/19/2018 0515   LABSPEC 1.020 12/19/2018 0515   PHURINE 7.0 12/19/2018 0515   GLUCOSEU 50 (A) 12/19/2018 0515   HGBUR SMALL (A) 12/19/2018 0515   BILIRUBINUR NEGATIVE 12/19/2018 0515   KETONESUR NEGATIVE 12/19/2018 0515   PROTEINUR 100 (A) 12/19/2018 0515   NITRITE POSITIVE (A) 12/19/2018 0515   LEUKOCYTESUR LARGE (A) 12/19/2018 0515   Sepsis Labs: @LABRCNTIP (procalcitonin:4,lacticidven:4)  ) Recent Results (from the past 240 hour(s))  SARS CORONAVIRUS 2 Nasal Swab Aptima Multi Swab     Status: None   Collection Time: 12/19/18  5:24 AM   Specimen: Aptima Multi Swab; Nasal Swab  Result Value Ref Range Status   SARS Coronavirus 2 NEGATIVE NEGATIVE Final    Comment: (NOTE) SARS-CoV-2 target nucleic acids are NOT DETECTED. The SARS-CoV-2 RNA is generally detectable in upper and lower respiratory specimens during the acute phase of infection. Negative results do not preclude SARS-CoV-2 infection, do not rule out co-infections with other pathogens, and should not be used as the sole basis for treatment or other patient management decisions. Negative results must be combined with clinical observations, patient history, and epidemiological information. The expected result is Negative. Fact Sheet for Patients: SugarRoll.be Fact Sheet for Healthcare Providers: https://www.woods-mathews.com/ This test is not yet approved or cleared by the [place] FDA and  has been authorized for detection and/or diagnosis of SARS-CoV-2 by FDA under an Emergency Use Authorization (EUA). This EUA will remain  in effect (meaning this test can be used) for the duration of the COVID-19 declaration under Section 56 4(b)(1) of the Act, 21 U.S.C. section 360bbb-3(b)(1), unless the authorization is terminated or revoked sooner. Performed at [facility], [place] [address], [place], [place] [address]   MRSA PCR Screening     Status: None   Collection Time: 12/19/18 12:29 PM  Specimen: Nasal Mucosa; Nasopharyngeal  Result Value Ref Range Status   MRSA by PCR NEGATIVE NEGATIVE Final    Comment:        The GeneXpert MRSA Assay (FDA approved for NASAL specimens only), is one component of a comprehensive MRSA colonization surveillance program. It is not intended to diagnose MRSA infection nor to guide or monitor treatment for MRSA infections. Performed at [facility], [place] [address], [place], [name] [address]       Studies: Ct Head Wo Contrast  Result Date: 12/19/2018 CLINICAL DATA:  78 year old female with altered level of consciousness, unexplained. EXAM: CT HEAD WITHOUT CONTRAST TECHNIQUE: Contiguous axial images were obtained from the base of the skull through the vertex without intravenous contrast. COMPARISON:  06/18/2018 FINDINGS: Brain: No evidence of acute infarction, hemorrhage, hydrocephalus, extra-axial collection or mass lesion/mass effect. There is diffuse low-attenuation within the subcortical and periventricular white matter compatible with chronic microvascular disease. Prominence of the sulci and ventricles compatible with brain atrophy. Vascular: No hyperdense vessel or unexpected calcification. Skull: Normal. Negative for fracture or focal lesion. Sinuses/Orbits: No acute finding. Partial opacification of the left mastoid air cells. Other: None IMPRESSION: No acute intracranial abnormalities. 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 Electronically Signed   By: Kerby Moors M.D.   On: 12/19/2018 16:29    Scheduled Meds: . amLODipine  5 mg Oral Daily  . Chlorhexidine Gluconate Cloth  6 each Topical Daily  . enoxaparin (LOVENOX) injection  40 mg Subcutaneous Q24H  . insulin aspart  0-9 Units Subcutaneous Q4H  . latanoprost  1 drop Both Eyes QHS  . levothyroxine  25 mcg Oral  QAC breakfast  . metoprolol tartrate  25 mg Oral BID  . pantoprazole  40 mg Oral Daily  . rosuvastatin  10 mg Oral QHS  . sodium chloride flush  3 mL Intravenous Q12H    Continuous Infusions: . sodium chloride    . cefTRIAXone (ROCEPHIN)  IV Stopped (12/19/18 1305)     LOS: 1 day     Kayleen Memos, MD [facility] Pager [phone]  If 7PM-7AM, please contact night-coverage [email or web address] Password [email or web address] 12/20/2018, 8:30 AM

## 2018-12-20 NOTE — Progress Notes (Signed)
Spoke with Geographical information systems officer phone number. Patient able to express pain in her abdomen and answer questions appropriately. Family members updated on plan of care and express that they would prefer to be translator for her. However nursing will continue to use medical interpreter for medical questions and allow family to supplement as necessary. Family will continue to translate ADL needs.

## 2018-12-20 NOTE — Progress Notes (Addendum)
Updated her son [name] via phone. All questions answered to his satisfaction.  He would like to bring [place] tea from home which she has the habit to enjoy.

## 2018-12-20 NOTE — Progress Notes (Signed)
Echocardiogram 2D Echocardiogram has been performed.  [name] [name] 12/20/2018, 9:48 AM

## 2018-12-21 DIAGNOSIS — E039 Hypothyroidism, unspecified: Secondary | ICD-10-CM | POA: Diagnosis not present

## 2018-12-21 DIAGNOSIS — G9341 Metabolic encephalopathy: Secondary | ICD-10-CM | POA: Diagnosis not present

## 2018-12-21 DIAGNOSIS — E871 Hypo-osmolality and hyponatremia: Secondary | ICD-10-CM | POA: Diagnosis not present

## 2018-12-21 LAB — GLUCOSE, CAPILLARY
Glucose-Capillary: 95 mg/dL (ref 70–99)
Glucose-Capillary: 129 mg/dL — ABNORMAL HIGH (ref 70–99)
Glucose-Capillary: 162 mg/dL — ABNORMAL HIGH (ref 70–99)
Glucose-Capillary: 197 mg/dL — ABNORMAL HIGH (ref 70–99)
Glucose-Capillary: 301 mg/dL — ABNORMAL HIGH (ref 70–99)
Glucose-Capillary: 306 mg/dL — ABNORMAL HIGH (ref 70–99)

## 2018-12-21 LAB — CBC WITH DIFFERENTIAL/PLATELET
Abs Immature Granulocytes: 0.03 10*3/uL (ref 0.00–0.07)
Basophils Absolute: 0 10*3/uL (ref 0.0–0.1)
Basophils Relative: 0 %
Eosinophils Absolute: 0.2 10*3/uL (ref 0.0–0.5)
Eosinophils Relative: 2 %
HCT: 31.6 % — ABNORMAL LOW (ref 36.0–46.0)
Hemoglobin: 9.9 g/dL — ABNORMAL LOW (ref 12.0–15.0)
Immature Granulocytes: 0 %
Lymphocytes Relative: 28 %
Lymphs Abs: 2.3 10*3/uL (ref 0.7–4.0)
MCH: 28.9 pg (ref 26.0–34.0)
MCHC: 31.3 g/dL (ref 30.0–36.0)
MCV: 92.1 fL (ref 80.0–100.0)
Monocytes Absolute: 1.3 10*3/uL — ABNORMAL HIGH (ref 0.1–1.0)
Monocytes Relative: 16 %
Neutro Abs: 4.5 10*3/uL (ref 1.7–7.7)
Neutrophils Relative %: 54 %
Platelets: 212 10*3/uL (ref 150–400)
RBC: 3.43 MIL/uL — ABNORMAL LOW (ref 3.87–5.11)
RDW: 14.3 % (ref 11.5–15.5)
WBC: 8.3 10*3/uL (ref 4.0–10.5)
nRBC: 0 % (ref 0.0–0.2)

## 2018-12-21 LAB — BASIC METABOLIC PANEL
Anion gap: 9 (ref 5–15)
BUN: 15 mg/dL (ref 8–23)
CO2: 21 mmol/L — ABNORMAL LOW (ref 22–32)
Calcium: 8.1 mg/dL — ABNORMAL LOW (ref 8.9–10.3)
Chloride: 97 mmol/L — ABNORMAL LOW (ref 98–111)
Creatinine, Ser: 1.44 mg/dL — ABNORMAL HIGH (ref 0.44–1.00)
GFR calc Af Amer: 40 mL/min — ABNORMAL LOW (ref >60)
GFR calc non Af Amer: 34 mL/min — ABNORMAL LOW (ref >60)
Glucose, Bld: 103 mg/dL — ABNORMAL HIGH (ref 70–99)
Potassium: 4.4 mmol/L (ref 3.5–5.1)
Sodium: 127 mmol/L — ABNORMAL LOW (ref 135–145)

## 2018-12-21 LAB — LIPASE, BLOOD: Lipase: 20 U/L (ref 11–51)

## 2018-12-21 LAB — PROCALCITONIN: Procalcitonin: <0.1 ng/mL

## 2018-12-21 LAB — LACTIC ACID, PLASMA: Lactic Acid, Venous: 0.8 mmol/L (ref 0.5–1.9)

## 2018-12-21 MED ORDER — AMLODIPINE BESYLATE 10 MG PO TABS
10.0000 mg | ORAL_TABLET | Freq: Every day | ORAL | Status: DC
  Administered 2018-12-21 – 2018-12-26 (×6): 10 mg via ORAL
  Filled 2018-12-21 (×6): qty 1

## 2018-12-21 MED ORDER — HYDRALAZINE HCL 25 MG PO TABS
25.0000 mg | ORAL_TABLET | Freq: Three times a day (TID) | ORAL | Status: DC
  Administered 2018-12-21 – 2018-12-24 (×9): 25 mg via ORAL
  Filled 2018-12-21 (×9): qty 1

## 2018-12-21 MED ORDER — LORAZEPAM 2 MG/ML IJ SOLN: 0.5000 mg | Freq: Once | INTRAMUSCULAR | Status: DC

## 2018-12-21 NOTE — Progress Notes (Signed)
PROGRESS NOTE  Lori Hardy UVO:[id number] DOB: April 11, 1941 DOA: 12/19/2018 PCP: Theressa Millard, MD (Inactive)  HPI/Recap of past 24 hours:  Lori Hardy is a 78 y.o. female with hx of DM2, HTN and cirrhosis brought to ED by family w/ AMS, nausea and abd distension. Became confused last night around 11 pm.  Hx of low Na in the past, admitted for this in Feb 2020. W/U in ED showed Na+ 114, creat 1.33, K 5.2, Cl 85, CO2 23 and albumin 3.1. eGFR 38.  WBC 11  H10  plt 217. UA was cloudy, largeLE, +nitrite, 100 prot, many bact/ > 50 wbc, 0-5 rbc.  UNa 11 and UOsm 283. CT abd pelvis showed possible proximal colitis, cirrhosis and extensive atherosclerosis, especially of the SMA and its branches; also chronic haziness of the small bowel mesentery likely from portal hypertension or prior mesenteric adenitis; also chronic mesenteric edema likely related to portal hypertension; also diffuse anasarca and NO ascites. Orders were given for 3% saline but repeat Na+ at 4 am was 119 so this was cancelled.  We are asked to see for admission.   Pt was admitted here in Feb 2020 w/ AMS due to E. Faecalis UTI. She had seizure w/ postictal Todd's paresis likely provoked in setting of ^^BS, hypoNa+, UTI/ sepsis and brain atrophy. She was started on Keppra by neurology because EEG showed R hemispheric dysfunction and she would likely have ^'d seizure risk in the future..  IV then po abx for UTI. DM and HTN as well on medications.    Patient seen in ED, poor historian.  Daughter is here , states she has been swelling up for last 2-3 weeks, drinking a lot of water / juice, thirsty.  Has been confused for about 1-2 days.    Pt was last walking good in 2018, she had a fall and since then has been not walking much.  She walked a little in 2019 and now in 2020 she is pretty much WC dependent.  Has has some abd pain the last few days, no diarrhea or N/V, no fevers.  Having headaches too, every day per the daughter.    12/20/18: Patient was seen and examined at her bedside.  She does not appear in distress.  Unable to obtain a history or review of systems due to language barrier.  Will call her family.  Vital signs noted for uncontrolled hypertension.  Lab studies noted for persistent hyponatremia from 114 on 12/19/2018 to 124 on 12/20/2018.  12/21/18: Patient was seen and examined at her bedside.  No acute events overnight.  Appears to still have abdominal pain.  She has anasarca secondary to cirrhosis with no ascites.  No reported nausea with vomiting.  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)   Cryptogenic cirrhosis of liver (HCC)   CKD (chronic kidney disease), stage III (HCC)   Encephalopathy   Debility   Diabetes mellitus type 2 in nonobese (HCC)   Benign essential HTN   Hyponatremia   Anasarca   Volume overload   Hyperkalemia   Pyuria   Pressure injury of skin   Acute metabolic encephalopathy likely secondary to severe hyponatremia versus others Presented with sodium level of 114 Sodium level improving to 124 to 127 on 12/21/2018 Continue strict fluid intake and sodium restriction less than 2 g/day Repeat BMP  Severe acute on chronic hypervolemic hyponatremia likely secondary to  cirrhosis Patient is being followed by nephrology and supposed to be on fluid restriction at home per renal at [facility] Per family has been drinking a lot of fluid at home Continue management as stated above Sodium continues to improve fluid restriction 1200 cc/day and sodium restriction 2 g/day Continue strict I's and O's and daily weight  Anasarca secondary to cirrhosis CT abdomen and pelvis with contrast done on 12/19/18 shows cirrhotic liver with surface lobulation and fissures/caudate lobe enlargement Persistent hypervolemia on exam Continue fluid and salt restriction  E. coli UTI, POA Continue Rocephin Monitor  urine output Monitor fever curve and WBC Ordered blood cultures x2 peripherally, in process  Uncontrolled hypertension Increase dose of Norvasc to 10 mg daily Continue hydralazine p.o. 25 mg 3 times daily Continue Lopressor p.o. 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  We will call family. Code status: full   Disposition: Discharge to home versus SNF possibly in 2 days once sodium level is close to baseline.    Objective: Vitals:   12/21/18 1000 12/21/18 1200 12/21/18 1334 12/21/18 1400  BP: (!) 166/59  (!) 152/61 (!) 152/53  Pulse: 74  68 66  Resp: _0 Temp:  98.7 F (37.1 C)    TempSrc:  Oral    SpO2: 99%  100% 100%  Weight:      Height:        Intake/Output Summary (Last 24 hours) at 12/21/2018 1531 Last data filed at 12/21/2018 1343 Gross per 24 hour  Intake 256.67 ml  Output 1150 ml  Net -893.33 ml   Filed Weights   12/20/18 0500  Weight: 80.5 kg    Exam:  . General: 78 y.o. year-old female well-developed well-nourished in no acute distress.   . Cardiovascular: Regular rate and rhythm no rubs or gallops no JVD or thyromegaly.   Marland Kitchen Respiratory: Clear to auscultation no wheezes or  rales.  Poor inspiratory effort.   . Abdomen: Soft no rebound on palpation.  Bowel sounds present.  . Musculoskeletal: Pitting edema affecting all 4 extremities.   Marland Kitchen Psychiatry: Mood is appropriate for condition and setting.   Data Reviewed: CBC: Recent Labs  Lab 12/19/18 0247 12/20/18 0222 12/21/18 0612  WBC 11.7* 9.5 8.3  NEUTROABS 8.0*  --  4.5  HGB 10.2* 10.5* 9.9*  HCT 30.4* 32.2* 31.6*  MCV 88.4 90.2 92.1  PLT 217 190 016   Basic Metabolic Panel: Recent Labs  Lab 12/19/18 0247  12/19/18 0856 12/19/18 1430 12/19/18 1802 12/20/18 0222 12/21/18 0211  NA 114*   < > 118* 120* 122* 124* 127*  K 5.2*  --   --   --   --  4.7 4.4  CL 85*  --   --   --   --  94* 97*  CO2 23  --   --   --   --  22 21*  GLUCOSE 233*  --   --   --   --  79 103*  BUN 14  --   --   --   --  12 15  CREATININE 1.33*  --   --   --   --  1.36* 1.44*  CALCIUM 8.1*  --   --   --   --  8.3* 8.1*   < > = values in this interval not displayed.   GFR: Estimated Creatinine Clearance: 31.8 mL/min (A) (by C-G formula based on SCr of 1.44 mg/dL (H)). Liver Function Tests: Recent Labs  Lab 12/19/18 0247  AST 32  ALT 30  ALKPHOS 95  BILITOT 0.7  PROT 7.6  ALBUMIN 3.1*   No results for input(s): LIPASE, AMYLASE in the last 168 hours. Recent Labs  Lab 12/19/18 0247  AMMONIA 13   Coagulation Profile: No results for input(s): INR, PROTIME in the last 168 hours. Cardiac Enzymes: No results for input(s): CKTOTAL, CKMB, CKMBINDEX, TROPONINI in the last 168 hours. BNP (last 3 results) No results for input(s): PROBNP in the last 8760 hours. HbA1C: No results for input(s): HGBA1C in the last 72 hours. CBG: Recent Labs  Lab 12/20/18 1938 12/20/18 2312 12/21/18 0308 12/21/18 0759 12/21/18 1119  GLUCAP 144* 146* 95 129* 162*   Lipid Profile: No results for input(s): CHOL, HDL, LDLCALC, TRIG, CHOLHDL, LDLDIRECT in the last 72 hours. Thyroid Function Tests: No results for input(s): TSH,  T4TOTAL, FREET4, T3FREE, THYROIDAB in the last 72 hours. Anemia Panel: No results for input(s): VITAMINB12, FOLATE, FERRITIN, TIBC, IRON, RETICCTPCT in the last 72 hours. Urine analysis:    Component Value Date/Time   COLORURINE YELLOW 12/19/2018 0515   APPEARANCEUR CLOUDY (A) 12/19/2018 0515   LABSPEC 1.020 12/19/2018 0515   PHURINE 7.0 12/19/2018 0515   GLUCOSEU 50 (A) 12/19/2018 0515   HGBUR SMALL (A) 12/19/2018 0515   BILIRUBINUR NEGATIVE 12/19/2018 0515   KETONESUR NEGATIVE 12/19/2018 0515   PROTEINUR 100 (A) 12/19/2018 0515   NITRITE POSITIVE (A) 12/19/2018 0515   LEUKOCYTESUR LARGE (A) 12/19/2018 0515   Sepsis Labs: _0 (procalcitonin:4,lacticidven:4)  ) Recent Results (from the past 240 hour(s))  Urine culture     Status: Abnormal (Preliminary result)   Collection Time: 12/19/18  5:15 AM   Specimen: Urine, Catheterized  Result Value Ref Range Status   Specimen Description   Final    URINE, CATHETERIZED Performed at [facility], [place] [address], [place], [place] [address]    Special Requests   Final    NONE Performed at [facility], [place] [address] [place], [place] [address]    Culture (A)  Final    >=100,000 COLONIES/mL ESCHERICHIA COLI SUSCEPTIBILITIES TO FOLLOW Performed at [facility], [place] [address], [place], [place] [address]    Report Status PENDING  Incomplete  SARS CORONAVIRUS 2 Nasal Swab Aptima Multi Swab     Status: None   Collection Time: 12/19/18  5:24 AM   Specimen: Aptima Multi Swab; Nasal Swab  Result Value Ref Range Status   SARS Coronavirus 2 NEGATIVE NEGATIVE Final    Comment: (NOTE) SARS-CoV-2 target nucleic acids are NOT DETECTED. The SARS-CoV-2 RNA is generally detectable in upper and lower respiratory specimens during the acute phase of infection. Negative results do not preclude SARS-CoV-2 infection, do not rule out co-infections with other pathogens, and  should not be used as the  sole basis for treatment or other patient management decisions. Negative results must be combined with clinical observations, patient history, and epidemiological information. The expected result is Negative. Fact Sheet for Patients: SugarRoll.be Fact Sheet for Healthcare Providers: https://www.woods-mathews.com/ This test is not yet approved or cleared by the [place] FDA and  has been authorized for detection and/or diagnosis of SARS-CoV-2 by FDA under an Emergency Use Authorization (EUA). This EUA will remain  in effect (meaning this test can be used) for the duration of the COVID-19 declaration under Section 56 4(b)(1) of the Act, 21 U.S.C. section 360bbb-3(b)(1), unless the authorization is terminated or revoked sooner. Performed at [facility], [place] [address], [place], [place] [address]   MRSA PCR Screening     Status: None   Collection Time: 12/19/18 12:29 PM   Specimen: Nasal Mucosa; Nasopharyngeal  Result Value Ref Range Status   MRSA by PCR NEGATIVE NEGATIVE Final    Comment:        The GeneXpert MRSA Assay (FDA approved for NASAL specimens only), is one component of a comprehensive MRSA colonization surveillance program. It is not intended to diagnose MRSA infection nor to guide or monitor treatment for MRSA infections. Performed at [facility], [place] [address] [place], [place] [address]       Studies: No results found.  Scheduled Meds: . amLODipine  10 mg Oral Daily  . Chlorhexidine Gluconate Cloth  6 each Topical Daily  . enoxaparin (LOVENOX) injection  40 mg Subcutaneous Q24H  . hydrALAZINE  25 mg Oral Q8H  . insulin aspart  0-9 Units Subcutaneous Q4H  . latanoprost  1 drop Both Eyes QHS  . levothyroxine  25 mcg Oral QAC breakfast  . LORazepam  0.5 mg Intravenous Once  . metoprolol tartrate  25 mg Oral BID  . pantoprazole  40 mg Oral Daily  . rosuvastatin  10 mg Oral QHS  .  sodium chloride flush  3 mL Intravenous Q12H    Continuous Infusions: . sodium chloride    . cefTRIAXone (ROCEPHIN)  IV Stopped (12/21/18 1158)     LOS: 2 days     Kayleen Memos, MD [facility] Pager [phone]  If 7PM-7AM, please contact night-coverage [email or web address] Password [facility] 12/21/2018, 3:31 PM

## 2018-12-21 NOTE — Progress Notes (Addendum)
[facility] Progress Note   Subjective/Interval events:    Afebrile overnight, HTNive.  Noted new blood cultures collected, lactate (WNL), procalcitonin (WNL) - appears were part of admission sepsis bundle. I/Os yesterday 400 / 2000 after 20IV lasix.  Collected with purewick.  Per RN po intake not robust.  Urine culture with E coli > 100k cfu/mL TTE normal EF, DD, no significant valvular disease.  Cr 1.36 yesterday 1.44 today.    Objective Vitals:   12/21/18 0400 12/21/18 0500 12/21/18 0600 12/21/18 0800  BP: (!) 152/62 (!) 165/69 (!) 179/72   Pulse: 65 64 70   Resp: 18 15 15    Temp:    98.4 F (36.9 C)  TempSrc:    Axillary  SpO2: 99% 98% 100%   Weight:      Height:       Physical Exam General: more awake today, lying at 45degrees in no distress  Heart: RRR, no rub Lungs: clear ant Abdomen: soft, mod distended and mod tender with deep palpation - no rebound or guarding. Extremities: trace LE edema now  Additional Objective Labs: Basic Metabolic Panel: Recent Labs  Lab 12/19/18 0247  12/19/18 1802 12/20/18 0222 12/21/18 0211  NA 114*   < > 122* 124* 127*  K 5.2*  --   --  4.7 4.4  CL 85*  --   --  94* 97*  CO2 23  --   --  22 21*  GLUCOSE 233*  --   --  79 103*  BUN 14  --   --  12 15  CREATININE 1.33*  --   --  1.36* 1.44*  CALCIUM 8.1*  --   --  8.3* 8.1*   < > = values in this interval not displayed.   Liver Function Tests: Recent Labs  Lab 12/19/18 0247  AST 32  ALT 30  ALKPHOS 95  BILITOT 0.7  PROT 7.6  ALBUMIN 3.1*   No results for input(s): LIPASE, AMYLASE in the last 168 hours. CBC: Recent Labs  Lab 12/19/18 0247 12/20/18 0222 12/21/18 0612  WBC 11.7* 9.5 8.3  NEUTROABS 8.0*  --  4.5  HGB 10.2* 10.5* 9.9*  HCT 30.4* 32.2* 31.6*  MCV 88.4 90.2 92.1  PLT 217 190 212   Blood Culture    Component Value Date/Time   SDES  12/19/2018 0515    URINE, CATHETERIZED Performed at [facility], [place]  [address], [place], [place] [address]    SPECREQUEST  12/19/2018 0515    NONE Performed at [facility], [place] [address], [place], [place] [address]    CULT (A) 12/19/2018 0515    >=100,000 COLONIES/mL ESCHERICHIA COLI SUSCEPTIBILITIES TO FOLLOW Performed at [place] [address], [place], [place] [address]    REPTSTATUS PENDING 12/19/2018 0515    Cardiac Enzymes: No results for input(s): CKTOTAL, CKMB, CKMBINDEX, TROPONINI in the last 168 hours. CBG: Recent Labs  Lab 12/20/18 1551 12/20/18 1938 12/20/18 2312 12/21/18 0308 12/21/18 0759  GLUCAP 88 144* 146* 95 129*   Iron Studies: No results for input(s): IRON, TIBC, TRANSFERRIN, FERRITIN in the last 72 hours. @lablastinr3 @ Studies/Results: Ct Head Wo Contrast  Result Date: 12/19/2018 CLINICAL DATA:  78 year old female with altered level of consciousness, unexplained. EXAM: CT HEAD WITHOUT CONTRAST TECHNIQUE: Contiguous axial images were obtained from the base of the skull through the vertex without intravenous contrast. COMPARISON:  06/18/2018 FINDINGS: Brain: No evidence of acute infarction, hemorrhage, hydrocephalus, extra-axial collection  or mass lesion/mass effect. There is diffuse low-attenuation within the subcortical and periventricular white matter compatible with chronic microvascular disease. Prominence of the sulci and ventricles compatible with brain atrophy. Vascular: No hyperdense vessel or unexpected calcification. Skull: Normal. Negative for fracture or focal lesion. Sinuses/Orbits: No acute finding. Partial opacification of the left mastoid air cells. Other: None IMPRESSION: No acute intracranial abnormalities. 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 Electronically Signed   By: Signa Kellaylor  Stroud M.D.   On: 12/19/2018 16:29   Medications: . sodium chloride    . cefTRIAXone (ROCEPHIN)  IV Stopped (12/20/18 1236)   . amLODipine  10 mg Oral Daily  . Chlorhexidine  Gluconate Cloth  6 each Topical Daily  . enoxaparin (LOVENOX) injection  40 mg Subcutaneous Q24H  . insulin aspart  0-9 Units Subcutaneous Q4H  . latanoprost  1 drop Both Eyes QHS  . levothyroxine  25 mcg Oral QAC breakfast  . LORazepam  0.5 mg Intravenous Once  . metoprolol tartrate  25 mg Oral BID  . pantoprazole  40 mg Oral Daily  . rosuvastatin  10 mg Oral QHS  . sodium chloride flush  3 mL Intravenous Q12H    Assessment/Plan: **Hyponatremia:  Severe with acute on chronic; per care everywhere appear to have chronic mild hyponatremia with BL in the ~130 range. Per exam she is quite volume overloaded and this is consistent with hypervolemic hyponatremia most likely related to cirrhosis (though BP normal to high and no ascites).    With fluid restriction and diuresis has trended up to now 127 on last check.  She appears to be globally improving.   --Check AM sodium --cont diet with fluid restriction 124100mL/day, 2g sodium diet --Hold IV diuresis today but hold volume expansion as well -Strict I/Os (use purewick or even foley if needed to tract accurately), daily weights ordered  **anasarca:  Per above likely related to cirrhosis but a bit unusual with her high BP and lack of ascites.  TTE was ok.  Sodium and fluid restriction --Hold on lasix today --May need diuretic at discharge to maintain euvolemia but may just need fluid and sodium restriction, TBD  **Abd pain:  Initial CT with possible colitis, no ascites.  Family says abd distention noted for weeks-months now.  Further eval per primary.    **E coli UTI:  On ceftriaxone, culture with e coli, sens pending.   **HTN: on home BB and amlodipine, ARB on hold for now. Will initiate hydralazine 25 BID.   **CKD3:  At baseline Cr 1.3.   **H/o seizure d/o:  Had seizure in similar setting, was started on keppra but d/c'd at neurology f/u.  Correcting underlying issues that may have promoted seizure in past.   Estill BakesLindsay Dunia Pringle  MD 12/21/2018, 9:37 AM  [facility] Pager: [phone]

## 2018-12-22 ENCOUNTER — Inpatient Hospital Stay (HOSPITAL_COMMUNITY): Payer: Medicare Other

## 2018-12-22 DIAGNOSIS — E039 Hypothyroidism, unspecified: Secondary | ICD-10-CM | POA: Diagnosis not present

## 2018-12-22 DIAGNOSIS — E871 Hypo-osmolality and hyponatremia: Secondary | ICD-10-CM | POA: Diagnosis not present

## 2018-12-22 DIAGNOSIS — R400 Somnolence: Secondary | ICD-10-CM | POA: Diagnosis not present

## 2018-12-22 DIAGNOSIS — G9341 Metabolic encephalopathy: Secondary | ICD-10-CM | POA: Diagnosis not present

## 2018-12-22 LAB — BASIC METABOLIC PANEL
Anion gap: 11 (ref 5–15)
Anion gap: 9 (ref 5–15)
BUN: 18 mg/dL (ref 8–23)
BUN: 20 mg/dL (ref 8–23)
CO2: 19 mmol/L — ABNORMAL LOW (ref 22–32)
CO2: 23 mmol/L (ref 22–32)
Calcium: 7.9 mg/dL — ABNORMAL LOW (ref 8.9–10.3)
Calcium: 8.2 mg/dL — ABNORMAL LOW (ref 8.9–10.3)
Chloride: 94 mmol/L — ABNORMAL LOW (ref 98–111)
Chloride: 95 mmol/L — ABNORMAL LOW (ref 98–111)
Creatinine, Ser: 1.53 mg/dL — ABNORMAL HIGH (ref 0.44–1.00)
Creatinine, Ser: 1.5 mg/dL — ABNORMAL HIGH (ref 0.44–1.00)
GFR calc Af Amer: 37 mL/min — ABNORMAL LOW (ref >60)
GFR calc Af Amer: 38 mL/min — ABNORMAL LOW (ref >60)
GFR calc non Af Amer: 32 mL/min — ABNORMAL LOW (ref >60)
GFR calc non Af Amer: 33 mL/min — ABNORMAL LOW (ref >60)
Glucose, Bld: 208 mg/dL — ABNORMAL HIGH (ref 70–99)
Glucose, Bld: 216 mg/dL — ABNORMAL HIGH (ref 70–99)
Potassium: 4.3 mmol/L (ref 3.5–5.1)
Potassium: 4.7 mmol/L (ref 3.5–5.1)
Sodium: 124 mmol/L — ABNORMAL LOW (ref 135–145)
Sodium: 127 mmol/L — ABNORMAL LOW (ref 135–145)

## 2018-12-22 LAB — HEMOGLOBIN A1C
Hgb A1c MFr Bld: 7.3 % — ABNORMAL HIGH (ref 4.8–5.6)
Mean Plasma Glucose: 162.81 mg/dL

## 2018-12-22 LAB — GLUCOSE, CAPILLARY
Glucose-Capillary: 220 mg/dL — ABNORMAL HIGH (ref 70–99)
Glucose-Capillary: 132 mg/dL — ABNORMAL HIGH (ref 70–99)
Glucose-Capillary: 172 mg/dL — ABNORMAL HIGH (ref 70–99)
Glucose-Capillary: 206 mg/dL — ABNORMAL HIGH (ref 70–99)
Glucose-Capillary: 254 mg/dL — ABNORMAL HIGH (ref 70–99)

## 2018-12-22 LAB — ALBUMIN: Albumin: 2.8 g/dL — ABNORMAL LOW (ref 3.5–5.0)

## 2018-12-22 MED ORDER — INSULIN ASPART 100 UNIT/ML ~~LOC~~ SOLN
0.0000 [IU] | Freq: Every day | SUBCUTANEOUS | Status: DC
  Administered 2018-12-22 – 2018-12-24 (×2): 3 [IU] via SUBCUTANEOUS
  Administered 2018-12-25 – 2018-12-27 (×2): 2 [IU] via SUBCUTANEOUS

## 2018-12-22 MED ORDER — ALBUMIN HUMAN 25 % IV SOLN
25.0000 g | Freq: Three times a day (TID) | INTRAVENOUS | Status: DC
  Administered 2018-12-22 – 2018-12-24 (×7): 25 g via INTRAVENOUS
  Filled 2018-12-22: qty 100
  Filled 2018-12-22 (×2): qty 50
  Filled 2018-12-22 (×5): qty 100

## 2018-12-22 MED ORDER — SODIUM BICARBONATE 650 MG PO TABS
1300.0000 mg | ORAL_TABLET | Freq: Two times a day (BID) | ORAL | Status: AC
  Administered 2018-12-22 – 2018-12-23 (×4): 1300 mg via ORAL
  Filled 2018-12-22 (×4): qty 2

## 2018-12-22 MED ORDER — INSULIN ASPART 100 UNIT/ML ~~LOC~~ SOLN
0.0000 [IU] | Freq: Three times a day (TID) | SUBCUTANEOUS | Status: DC
  Administered 2018-12-22: 08:00:00 2 [IU] via SUBCUTANEOUS
  Administered 2018-12-22: 17:00:00 5 [IU] via SUBCUTANEOUS
  Administered 2018-12-22: 12:00:00 3 [IU] via SUBCUTANEOUS
  Administered 2018-12-23: 08:00:00 5 [IU] via SUBCUTANEOUS
  Administered 2018-12-23: 13:00:00 3 [IU] via SUBCUTANEOUS
  Administered 2018-12-23: 18:00:00 5 [IU] via SUBCUTANEOUS
  Administered 2018-12-24: 18:00:00 3 [IU] via SUBCUTANEOUS
  Administered 2018-12-24: 10:00:00 8 [IU] via SUBCUTANEOUS
  Administered 2018-12-24: 13:00:00 3 [IU] via SUBCUTANEOUS
  Administered 2018-12-25 (×2): 8 [IU] via SUBCUTANEOUS
  Administered 2018-12-25 – 2018-12-26 (×2): 5 [IU] via SUBCUTANEOUS
  Administered 2018-12-26 – 2018-12-27 (×4): 3 [IU] via SUBCUTANEOUS
  Administered 2018-12-27: 08:00:00 5 [IU] via SUBCUTANEOUS
  Administered 2018-12-28 (×2): 3 [IU] via SUBCUTANEOUS

## 2018-12-22 MED ORDER — FUROSEMIDE 10 MG/ML IJ SOLN
20.0000 mg | Freq: Two times a day (BID) | INTRAMUSCULAR | Status: DC
  Administered 2018-12-22: 10:00:00 20 mg via INTRAVENOUS
  Filled 2018-12-22: qty 2

## 2018-12-22 NOTE — Progress Notes (Signed)
Called the patient's son [name] to give update. No answer. Left a voicemail. Will call again.

## 2018-12-22 NOTE — Progress Notes (Signed)
PROGRESS NOTE  [name] UMP:[id number] DOB: 12/21/1940 DOA: 12/19/2018 PCP: Theressa Millard, MD (Inactive)  HPI/Recap of past 24 hours:  Lori Hardy is a 78 y.o. female with hx of DM2, HTN and cirrhosis brought to ED by family w/ AMS, nausea and abd distension. Became confused last night around 11 pm.  Hx of low Na in the past, admitted for this in Feb 2020. W/U in ED showed Na+ 114, creat 1.33, K 5.2, Cl 85, CO2 23 and albumin 3.1. eGFR 38.  WBC 11  H10  plt 217. UA was cloudy, largeLE, +nitrite, 100 prot, many bact/ > 50 wbc, 0-5 rbc.  UNa 11 and UOsm 283. CT abd pelvis showed possible proximal colitis, cirrhosis and extensive atherosclerosis, especially of the SMA and its branches; also chronic haziness of the small bowel mesentery likely from portal hypertension or prior mesenteric adenitis; also chronic mesenteric edema likely related to portal hypertension; also diffuse anasarca and NO ascites. Orders were given for 3% saline but repeat Na+ at 4 am was 119 so this was cancelled.  We are asked to see for admission.   Pt was admitted here in Feb 2020 w/ AMS due to E. Faecalis UTI. She had seizure w/ postictal Todd's paresis likely provoked in setting of ^^BS, hypoNa+, UTI/ sepsis and brain atrophy. She was started on Keppra by neurology because EEG showed R hemispheric dysfunction and she would likely have ^'d seizure risk in the future..  IV then po abx for UTI. DM and HTN as well on medications.    Patient seen in ED, poor historian.  Daughter is here , states she has been swelling up for last 2-3 weeks, drinking a lot of water / juice, thirsty.  Has been confused for about 1-2 days.    Pt was last walking good in 2018, she had a fall and since then has been not walking much.  She walked a little in 2019 and now in 2020 she is pretty much WC dependent.  Has has some abd pain the last few days, no diarrhea or N/V, no fevers.  Having headaches too, every day per the daughter.    Hospital course complicated by uncontrolled hypertension for which hydralazine was started by nephrology.  AKI with: 74 intravascular volume depletion despite anasarca.  12/22/18: Patient was seen and examined at her bedside.  Does not appear to have the same level of pain in her abdomen on exam today compared to yesterday.  Abdominal x-ray done this morning 12/22/2018 showed no acute abnormality, personally reviewed.  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)   Cryptogenic cirrhosis of liver (HCC)   CKD (chronic kidney disease), stage III (HCC)   Encephalopathy   Debility   Diabetes mellitus type 2 in nonobese (HCC)   Benign essential HTN   Hyponatremia   Anasarca   Volume overload   Hyperkalemia   Pyuria   Pressure injury of skin   Acute metabolic encephalopathy likely secondary to severe hyponatremia versus others Presented with sodium level of 114 Sodium level improving to 124 to 127 on 12/21/2018 Continue strict fluid intake and sodium restriction less than 2 g/day Appears to be improving, she is more alert and more talkative today  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  Highly appreciated.  Anasarca secondary to cirrhosis with hypoalbuminemia CT abdomen and pelvis with contrast done on 12/19/18 shows cirrhotic liver with surface lobulation and fissures/caudate lobe enlargement Persistent hypervolemia on exam but appears to be improving compared to yesterday  Continue fluid and salt restriction Albumin 2.8 on 12/22/2018  E. coli UTI, POA Continue Rocephin day number 3 out of 5 Monitor urine output Monitor fever curve and WBC Cultures drawn on 12/21/2018 no growth x1 day  Type 2 diabetes uncontrolled with hyperglycemia Hemoglobin A1c 7.3 on 12/21/2018 Continue insulin sliding scale  Uncontrolled hypertension Blood pressure is better controlled now with the addition of hydralazine Continue increase dose of Norvasc to 10 mg daily Continue hydralazine p.o. 25 mg 3 times daily Continue Lopressor p.o. 25 mg twice daily Continue to monitor vital signs  CKD 3 Baseline creatinine appears to be 1.3 with GFR 46 Creatinine worsening 1.53 on 12/22/2018 with GFR of 32.  Appears to be intravascular volume depleted Started on IV albumin 25 g 3 times daily per nephrology Continue to closely monitor renal function and urine output Continue to avoid nephrotoxins  Obesity BMI 32 Recommend weight loss outpatient when more stable with healthy dieting and regular physical activity  Proximal colitis, possible by CT scan Lactic acid and procalcitonin negative on 12/21/2018 Abdominal x-ray done on 12/22/2018 personally reviewed, no acute findings.  History of seizures During her last admission in January 2020 she was placed on Keppra but this has been discontinued at neurology office.'  Physical debility/ambulatory dysfunction PT OT to assess Fall precautions    DVT prophylaxis:  Subcu Lovenox daily Family communication:  Family not at bedside this morning.  We will call family. Code status: full   Disposition: Discharge to home when sodium level is close to baseline with hemodynamic stability and nephrology signs off.    Objective: Vitals:   12/22/18 0700 12/22/18 0800 12/22/18 0825 12/22/18 1200  BP: 136/66  (!) 160/85   Pulse: 86 92    Resp: (!) 27 20    Temp:  98.2 F (36.8 C)  98 F (36.7 C)  TempSrc:  Oral  Oral  SpO2: 98% 100%     Weight:      Height:        Intake/Output Summary (Last 24 hours) at 12/22/2018 1357 Last data filed at 12/21/2018 1840 Gross per 24 hour  Intake -  Output 300 ml  Net -300 ml   Filed Weights   12/20/18 0500 12/22/18 0500  Weight: 80.5 kg 84.3 kg    Exam:  . General: 78 y.o. year-old female well-developed well-nourished no acute distress.  Alert and very talkative.   . Cardiovascular: The rate and rhythm no rubs or gallops no JVD or thyromegaly.   Marland Kitchen Respiratory: Clear to auscultation no wheezes or rales.  Poor inspiratory effort.   . Abdomen: Soft no rebound on palpation.  Not as tender with palpation as in prior days. . Musculoskeletal: Dependent edema affecting all 4 extremities. Marland Kitchen Psychiatry: Mood is appropriate for condition and setting.   Data Reviewed: CBC: Recent Labs  Lab 12/19/18 0247 12/20/18 0222 12/21/18 0612  WBC 11.7* 9.5 8.3  NEUTROABS 8.0*  --  4.5  HGB 10.2* 10.5* 9.9*  HCT 30.4* 32.2* 31.6*  MCV 88.4 90.2 92.1  PLT 217 190 767   Basic Metabolic Panel: Recent Labs  Lab 12/19/18 0247  12/19/18 1430 12/19/18 1802 12/20/18 0222 12/21/18 0211 12/22/18 0215  NA 114*   < > 120* 122* 124* 127* 124*  K 5.2*  --   --   --  4.7 4.4 4.3  CL 85*  --   --   --  94* 97* 94*  CO2 23  --   --   --  22 21* 19*  GLUCOSE 233*  --   --   --  79 103* 208*  BUN 14  --   --   --  12 15 18   CREATININE 1.33*  --   --   --  1.36* 1.44* 1.53*  CALCIUM 8.1*  --   --   --  8.3* 8.1* 7.9*   < > = values in this interval not displayed.   GFR: Estimated Creatinine Clearance: 30.7 mL/min (A) (by C-G formula based on SCr of 1.53 mg/dL (H)). Liver Function Tests: Recent Labs  Lab 12/19/18 0247 12/22/18 0215  AST 32  --   ALT 30  --   ALKPHOS 95  --   BILITOT 0.7  --   PROT 7.6  --   ALBUMIN 3.1* 2.8*   Recent Labs  Lab 12/21/18 0630  LIPASE 20   Recent Labs  Lab 12/19/18 0247  AMMONIA 13   Coagulation Profile: No results for input(s): INR, PROTIME in  the last 168 hours. Cardiac Enzymes: No results for input(s): CKTOTAL, CKMB, CKMBINDEX, TROPONINI in the last 168 hours. BNP (last 3 results) No results for input(s): PROBNP in the last 8760 hours. HbA1C: Recent Labs    12/21/18 0612  HGBA1C 7.3*   CBG: Recent Labs  Lab 12/21/18 1926 12/21/18 2257 12/22/18 0435 12/22/18 0740 12/22/18 1127  GLUCAP 301* 306* 220* 132* 172*   Lipid Profile: No results for input(s): CHOL, HDL, LDLCALC, TRIG, CHOLHDL, LDLDIRECT in the last 72 hours. Thyroid Function Tests: No results for input(s): TSH, T4TOTAL, FREET4, T3FREE, THYROIDAB in the last 72 hours. Anemia Panel: No results for input(s): VITAMINB12, FOLATE, FERRITIN, TIBC, IRON, RETICCTPCT in the last 72 hours. Urine analysis:    Component Value Date/Time   COLORURINE YELLOW 12/19/2018 0515   APPEARANCEUR CLOUDY (A) 12/19/2018 0515   LABSPEC 1.020 12/19/2018 0515   PHURINE 7.0 12/19/2018 0515   GLUCOSEU 50 (A) 12/19/2018 0515   HGBUR SMALL (A) 12/19/2018 0515   BILIRUBINUR NEGATIVE 12/19/2018 0515   KETONESUR NEGATIVE 12/19/2018 0515   PROTEINUR 100 (A) 12/19/2018 0515   NITRITE POSITIVE (A) 12/19/2018 0515   LEUKOCYTESUR LARGE (A) 12/19/2018 0515   Sepsis Labs: @LABRCNTIP (procalcitonin:4,lacticidven:4)  ) Recent Results (from the past 240 hour(s))  Urine culture     Status: Abnormal (Preliminary result)   Collection Time: 12/19/18  5:15 AM   Specimen: Urine, Catheterized  Result Value Ref Range Status   Specimen Description   Final    URINE, CATHETERIZED Performed at [facility], [place] [address], [place], [place] [address]    Special Requests   Final    NONE Performed at [facility], [place] [address], [place], [place] [address]    Culture (A)  Final    >=100,000 COLONIES/mL ESCHERICHIA COLI REPEATING TO CONFIRM SUSCEPTIBILITIES Performed at [facility], [place] [address], [place], [place] [address]    Report Status PENDING   Incomplete  SARS CORONAVIRUS  2 Nasal Swab Aptima Multi Swab     Status: None   Collection Time: 12/19/18  5:24 AM   Specimen: Aptima Multi Swab; Nasal Swab  Result Value Ref Range Status   SARS Coronavirus 2 NEGATIVE NEGATIVE Final    Comment: (NOTE) SARS-CoV-2 target nucleic acids are NOT DETECTED. The SARS-CoV-2 RNA is generally detectable in upper and lower respiratory specimens during the acute phase of infection. Negative results do not preclude SARS-CoV-2 infection, do not rule out co-infections with other pathogens, and should not be used as the sole basis for treatment or other patient management decisions. Negative results must be combined with clinical observations, patient history, and epidemiological information. The expected result is Negative. Fact Sheet for Patients: SugarRoll.be Fact Sheet for Healthcare Providers: https://www.woods-mathews.com/ This test is not yet approved or cleared by the [place] FDA and  has been authorized for detection and/or diagnosis of SARS-CoV-2 by FDA under an Emergency Use Authorization (EUA). This EUA will remain  in effect (meaning this test can be used) for the duration of the COVID-19 declaration under Section 56 4(b)(1) of the Act, 21 U.S.C. section 360bbb-3(b)(1), unless the authorization is terminated or revoked sooner. Performed at [facility], [place] [address] [place], [place] [address]   MRSA PCR Screening     Status: None   Collection Time: 12/19/18 12:29 PM   Specimen: Nasal Mucosa; Nasopharyngeal  Result Value Ref Range Status   MRSA by PCR NEGATIVE NEGATIVE Final    Comment:        The GeneXpert MRSA Assay (FDA approved for NASAL specimens only), is one component of a comprehensive MRSA colonization surveillance program. It is not intended to diagnose MRSA infection nor to guide or monitor treatment for MRSA infections. Performed at [facility], [place] [address], [place], [place] [address]   Culture, blood (routine x 2)     Status: None (Preliminary result)   Collection Time: 12/21/18  6:12 AM   Specimen: BLOOD RIGHT ARM  Result Value Ref Range Status   Specimen Description   Final    BLOOD RIGHT ARM Performed at [facility] [address] [place], [place] [address]    Special Requests   Final    BOTTLES DRAWN AEROBIC ONLY Blood Culture adequate volume Performed at [facility] [address] [place], [place] [address]    Culture   Final    NO GROWTH 1 DAY Performed at [facility], [place] [address], [place], [place] [address]    Report Status PENDING  Incomplete  Culture, blood (routine x 2)     Status: None (Preliminary result)   Collection Time: 12/21/18  6:13 AM   Specimen: BLOOD RIGHT HAND  Result Value Ref Range Status   Specimen Description   Final    BLOOD RIGHT HAND Performed at [place] [address], [place], [place] [address]    Special Requests   Final    BOTTLES DRAWN AEROBIC ONLY Blood Culture adequate volume Performed at [place] [address] [place], [place] [address]    Culture   Final    NO GROWTH 1 DAY Performed at [facility], [place] [address], [place],  [address]    Report Status PENDING  Incomplete      Studies: Dg Abd Portable 1v  Result Date: 12/22/2018 CLINICAL DATA:  Abdominal pain EXAM: PORTABLE ABDOMEN - 1 VIEW COMPARISON:  06/25/2018 FINDINGS:  Scattered large and small bowel gas is noted. Mild retained fecal material is noted. No obstructive changes are seen. No free air is noted. Degenerative changes of lumbar spine are seen. IMPRESSION: No acute abnormality noted. Electronically Signed   By: Inez Catalina M.D.   On: 12/22/2018 08:48    Scheduled Meds: . amLODipine  10 mg Oral Daily  . Chlorhexidine Gluconate Cloth  6 each Topical Daily  . enoxaparin (LOVENOX)  injection  40 mg Subcutaneous Q24H  . hydrALAZINE  25 mg Oral Q8H  . insulin aspart  0-15 Units Subcutaneous TID WC  . insulin aspart  0-5 Units Subcutaneous QHS  . latanoprost  1 drop Both Eyes QHS  . levothyroxine  25 mcg Oral QAC breakfast  . LORazepam  0.5 mg Intravenous Once  . metoprolol tartrate  25 mg Oral BID  . pantoprazole  40 mg Oral Daily  . rosuvastatin  10 mg Oral QHS  . sodium bicarbonate  1,300 mg Oral BID  . sodium chloride flush  3 mL Intravenous Q12H    Continuous Infusions: . sodium chloride 10 mL/hr at 12/22/18 1127  . albumin human 25 g (12/22/18 0941)  . cefTRIAXone (ROCEPHIN)  IV Stopped (12/22/18 1158)     LOS: 3 days     Kayleen Memos, MD [facility] Pager [phone]  If 7PM-7AM, please contact night-coverage [email or web address] Password [facility] 12/22/2018, 1:57 PM

## 2018-12-22 NOTE — Progress Notes (Signed)
[facility] Progress Note   Subjective/Interval events:    Afebrile, BPs range 113-160.  On RA.  I/O 220 / 300 yesterday with no IVF and no diuretics.  Serum sodium down to 124 this AM, cr 1.53, albumin 2.8.  Unable to reach family via telephone - home or cell.    Objective Vitals:   12/22/18 0505 12/22/18 0700 12/22/18 0800 12/22/18 0825  BP: 140/72 136/66  (!) 160/85  Pulse: 82 86 92   Resp: 16 (!) 27 20   Temp:   98.2 F (36.8 C)   TempSrc:   Oral   SpO2: 96% 98% 100%   Weight:      Height:       Physical Exam General: awake, lying at 45degrees in no distress; 5 drink cups on her tray but each is nearly full Heart: RRR, no rub Lungs: clear ant Abdomen: soft, mod distended but no TTP today Extremities: 1+ dependent thigh edema 1+ UE edema, no ankle edema  Additional Objective Labs: Basic Metabolic Panel: Recent Labs  Lab 12/20/18 0222 12/21/18 0211 12/22/18 0215  NA 124* 127* 124*  K 4.7 4.4 4.3  CL 94* 97* 94*  CO2 22 21* 19*  GLUCOSE 79 103* 208*  BUN 12 15 18   CREATININE 1.36* 1.44* 1.53*  CALCIUM 8.3* 8.1* 7.9*   Liver Function Tests: Recent Labs  Lab 12/19/18 0247 12/22/18 0215  AST 32  --   ALT 30  --   ALKPHOS 95  --   BILITOT 0.7  --   PROT 7.6  --   ALBUMIN 3.1* 2.8*   Recent Labs  Lab 12/21/18 0630  LIPASE 20   CBC: Recent Labs  Lab 12/19/18 0247 12/20/18 0222 12/21/18 0612  WBC 11.7* 9.5 8.3  NEUTROABS 8.0*  --  4.5  HGB 10.2* 10.5* 9.9*  HCT 30.4* 32.2* 31.6*  MCV 88.4 90.2 92.1  PLT 217 190 212   Blood Culture    Component Value Date/Time   SDES  12/21/2018 9833    BLOOD RIGHT HAND Performed at [facility], [place] [address], [place], [place] [address]    SPECREQUEST  12/21/2018 [phone]    BOTTLES DRAWN AEROBIC ONLY Blood Culture adequate volume Performed at [facility] [address], [place], [place] [address]    CULT  12/21/2018 [phone]    NO GROWTH 1  DAY Performed at [facility] [address], [place], [place] [address]    REPTSTATUS PENDING 12/21/2018 3532    Cardiac Enzymes: No results for input(s): CKTOTAL, CKMB, CKMBINDEX, TROPONINI in the last 168 hours. CBG: Recent Labs  Lab 12/21/18 1926 12/21/18 2257 12/22/18 0435 12/22/18 0740 12/22/18 1127  GLUCAP 301* 306* 220* 132* 172*   Iron Studies: No results for input(s): IRON, TIBC, TRANSFERRIN, FERRITIN in the last 72 hours. @lablastinr3 @ Studies/Results: Dg Abd Portable 1v  Result Date: 12/22/2018 CLINICAL DATA:  Abdominal pain EXAM: PORTABLE ABDOMEN - 1 VIEW COMPARISON:  06/25/2018 FINDINGS: Scattered large and small bowel gas is noted. Mild retained fecal material is noted. No obstructive changes are seen. No free air is noted. Degenerative changes of lumbar spine are seen. IMPRESSION: No acute abnormality noted. Electronically Signed   By: Inez Catalina M.D.   On: 12/22/2018 08:48   Medications: . sodium chloride 10 mL/hr at 12/22/18 1127  . albumin human 25 g (12/22/18 0941)  . cefTRIAXone (ROCEPHIN)  IV 1 g (12/22/18 1128)   . amLODipine  10 mg  Oral Daily  . Chlorhexidine Gluconate Cloth  6 each Topical Daily  . enoxaparin (LOVENOX) injection  40 mg Subcutaneous Q24H  . furosemide  20 mg Intravenous BID  . hydrALAZINE  25 mg Oral Q8H  . insulin aspart  0-15 Units Subcutaneous TID WC  . insulin aspart  0-5 Units Subcutaneous QHS  . latanoprost  1 drop Both Eyes QHS  . levothyroxine  25 mcg Oral QAC breakfast  . LORazepam  0.5 mg Intravenous Once  . metoprolol tartrate  25 mg Oral BID  . pantoprazole  40 mg Oral Daily  . rosuvastatin  10 mg Oral QHS  . sodium bicarbonate  1,300 mg Oral BID  . sodium chloride flush  3 mL Intravenous Q12H    Assessment/Plan: **Hyponatremia:  Severe with acute on chronic; per care everywhere appear to have chronic mild hyponatremia with BL in the ~130 range. Per exam she is quite volume overloaded and this is consistent  with hypervolemic hyponatremia most likely related to cirrhosis (though BP normal to high and no ascites).    With fluid restriction and diuresis had trended up to 127 however this AM down to 124 again.  Poor po intake, suspect intravascular volume depletion despite anasarca.  --Albumin 25g IV TID today --Hold IV diuresis today --Check 1600 and AM sodium --cont diet with fluid restriction 123100mL/day, 2g sodium diet -Strict I/Os (use purewick or even foley if needed to tract accurately), daily weights ordered -- daily weights haven't been recorded, but wt was obtained this AM  **anasarca:  Per above likely related to cirrhosis but a bit unusual with her high BP and lack of ascites.  TTE was ok.  Sodium and fluid restriction --Per above concern for intravascular volume depletion with worsening serum sodium and poor po intake. --Hold on lasix today --May need diuretic at discharge to maintain euvolemia but may just need fluid and sodium restriction, TBD  **Abd pain:  Initial CT with possible colitis, no ascites.  Family says abd distention noted for weeks-months now.  Further eval per primary.    **E coli UTI:  On ceftriaxone, culture with e coli, sens pending.   **HTN: on home BB and amlodipine, ARB on hold for now. Will cont hydralazine 25 BID initiated 8/13.  **CKD3:  Mild AKI now which I suspect is related to mild intravascular volume depletion.  Volume expansion with IV albumin per above.  **H/o seizure d/o:  Had seizure in similar setting, was started on keppra but d/c'd at neurology f/u.  Correcting underlying issues that may have promoted seizure in past.   Estill BakesLindsay Raini Tiley MD 12/22/2018, 12:03 PM  [facility] Pager: [phone]

## 2018-12-23 ENCOUNTER — Inpatient Hospital Stay (HOSPITAL_COMMUNITY): Payer: Medicare Other

## 2018-12-23 DIAGNOSIS — E871 Hypo-osmolality and hyponatremia: Secondary | ICD-10-CM | POA: Diagnosis not present

## 2018-12-23 DIAGNOSIS — G9341 Metabolic encephalopathy: Secondary | ICD-10-CM | POA: Diagnosis not present

## 2018-12-23 DIAGNOSIS — E039 Hypothyroidism, unspecified: Secondary | ICD-10-CM | POA: Diagnosis not present

## 2018-12-23 LAB — BASIC METABOLIC PANEL
Anion gap: 12 (ref 5–15)
BUN: 19 mg/dL (ref 8–23)
CO2: 20 mmol/L — ABNORMAL LOW (ref 22–32)
Calcium: 8.2 mg/dL — ABNORMAL LOW (ref 8.9–10.3)
Chloride: 94 mmol/L — ABNORMAL LOW (ref 98–111)
Creatinine, Ser: 1.49 mg/dL — ABNORMAL HIGH (ref 0.44–1.00)
GFR calc Af Amer: 38 mL/min — ABNORMAL LOW (ref >60)
GFR calc non Af Amer: 33 mL/min — ABNORMAL LOW (ref >60)
Glucose, Bld: 234 mg/dL — ABNORMAL HIGH (ref 70–99)
Potassium: 4.4 mmol/L (ref 3.5–5.1)
Sodium: 126 mmol/L — ABNORMAL LOW (ref 135–145)

## 2018-12-23 LAB — GLUCOSE, CAPILLARY
Glucose-Capillary: 232 mg/dL — ABNORMAL HIGH (ref 70–99)
Glucose-Capillary: 197 mg/dL — ABNORMAL HIGH (ref 70–99)
Glucose-Capillary: 185 mg/dL — ABNORMAL HIGH (ref 70–99)

## 2018-12-23 MED ORDER — SIMETHICONE 80 MG PO CHEW
80.0000 mg | CHEWABLE_TABLET | Freq: Three times a day (TID) | ORAL | Status: AC
  Administered 2018-12-23 – 2018-12-25 (×6): 80 mg via ORAL
  Filled 2018-12-23 (×6): qty 1

## 2018-12-23 MED ORDER — FUROSEMIDE 10 MG/ML IJ SOLN
40.0000 mg | Freq: Once | INTRAMUSCULAR | Status: AC
  Administered 2018-12-23: 13:00:00 40 mg via INTRAVENOUS
  Filled 2018-12-23: qty 4

## 2018-12-23 MED ORDER — INSULIN GLARGINE 100 UNIT/ML ~~LOC~~ SOLN
7.0000 [IU] | Freq: Every day | SUBCUTANEOUS | Status: DC
  Administered 2018-12-23 – 2018-12-28 (×6): 7 [IU] via SUBCUTANEOUS
  Filled 2018-12-23 (×6): qty 0.07

## 2018-12-23 MED ORDER — POLYETHYLENE GLYCOL 3350 17 G PO PACK
17.0000 g | PACK | Freq: Two times a day (BID) | ORAL | Status: DC
  Administered 2018-12-23 – 2018-12-27 (×7): 17 g via ORAL
  Filled 2018-12-23 (×7): qty 1

## 2018-12-23 MED ORDER — SENNOSIDES-DOCUSATE SODIUM 8.6-50 MG PO TABS
2.0000 | ORAL_TABLET | Freq: Two times a day (BID) | ORAL | Status: DC
  Administered 2018-12-23 – 2018-12-27 (×7): 2 via ORAL
  Filled 2018-12-23 (×8): qty 2

## 2018-12-23 NOTE — Progress Notes (Signed)
PROGRESS NOTE  Lori Hardy GUR:[id number] DOB: 10-Jun-1940 DOA: 12/19/2018 PCP: Theressa Millard, MD (Inactive)  HPI/Recap of past 24 hours:  Lori Hardy is a 78 y.o. female with hx of DM2, HTN and cirrhosis brought to ED by family w/ AMS, nausea and abd distension. Became confused last night around 11 pm.  Hx of low Na in the past, admitted for this in Feb 2020. W/U in ED showed Na+ 114, creat 1.33, K 5.2, Cl 85, CO2 23 and albumin 3.1. eGFR 38.  WBC 11  H10  plt 217. UA was cloudy, largeLE, +nitrite, 100 prot, many bact/ > 50 wbc, 0-5 rbc.  UNa 11 and UOsm 283. CT abd pelvis showed possible proximal colitis, cirrhosis and extensive atherosclerosis, especially of the SMA and its branches; also chronic haziness of the small bowel mesentery likely from portal hypertension or prior mesenteric adenitis; also chronic mesenteric edema likely related to portal hypertension; also diffuse anasarca and NO ascites. Orders were given for 3% saline but repeat Na+ at 4 am was 119 so this was cancelled.  We are asked to see for admission.   Pt was admitted here in Feb 2020 w/ AMS due to E. Faecalis UTI. She had seizure w/ postictal Todd's paresis likely provoked in setting of ^^BS, hypoNa+, UTI/ sepsis and brain atrophy. She was started on Keppra by neurology because EEG showed R hemispheric dysfunction and she would likely have ^'d seizure risk in the future..  IV then po abx for UTI. DM and HTN as well on medications.    Patient seen in ED, poor historian.  Daughter is here , states she has been swelling up for last 2-3 weeks, drinking a lot of water / juice, thirsty.  Has been confused for about 1-2 days.    Pt was last walking good in 2018, she had a fall and since then has been not walking much.  She walked a little in 2019 and now in 2020 she is pretty much WC dependent.  Has has some abd pain the last few days, no diarrhea or N/V, no fevers.  Having headaches too, every day per the daughter.    Hospital course complicated by uncontrolled hypertension for which hydralazine was started by nephrology.  AKI with: 74 intravascular volume depletion despite anasarca.  12/23/18: Patient was seen and examined at bedside this morning.  Still having abdominal pain with distention.  X-ray abdomen unrevealing.  Nonobstructive gas pattern.  Simethicone 3 times daily added.    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)   Cryptogenic cirrhosis of liver (HCC)   CKD (chronic kidney disease), stage III (HCC)   Encephalopathy   Debility   Diabetes mellitus type 2 in nonobese (HCC)   Benign essential HTN   Hyponatremia   Anasarca   Volume overload   Hyperkalemia   Pyuria   Pressure injury of skin   Acute metabolic encephalopathy likely secondary to severe hyponatremia versus others Presented with sodium level of 114 Sodium 126 on 12/23/2018 from 127  Continue strict fluid intake and sodium restriction less than 2 g/day Mentation appears to be improved  Severe acute on chronic hypervolemic hyponatremia likely secondary to cirrhosis Patient is being followed by nephrology and supposed to be on fluid restriction at home per renal at [facility] Per family has been drinking a lot of fluid at home Continue management as stated above Sodium dropped  from 127 to 124 on 12/22/2018 to 127 to 126 on 12/23/2018. Continue fluid restriction 1200 mg daily and sodium restriction 2 g daily Continue strict I's and O's and daily weight Management per nephrology  Abdominal distention Abdominal x-ray unrevealing Personally reviewed showed nonobstructive abdominal gas pattern Simethicone added 80 mg 3 times daily x2 days  Type 2 diabetes with uncontrolled hyperglycemia Significantly elevated blood sugars this morning Added Lantus 7 units daily She is on 14 units daily of Lantus at home Increase sliding scale to  moderate, continue insulin sliding scale Hemoglobin A1c 7.3 on 12/21/2018  CKD 3 Baseline creatinine appears to be 1.3 with GFR 46 Slight improvement of creatinine from 1.50 to 1.49 on 12/23/2018 Appears to be intravascular volume depleted Started on IV albumin 25 g 3 times daily per nephrology Continue to closely monitor renal function and urine output Continue to avoid nephrotoxins Net I&O -3.0 L since admission  Chronic constipation No bowel movement documented per bedside RN Started scheduled bowel regimen On PRN MiraLAX  Anasarca secondary to cirrhosis with hypoalbuminemia CT abdomen and pelvis with contrast done on 12/19/18 shows cirrhotic liver with surface lobulation and fissures/caudate lobe enlargement Persistent hypervolemia on exam but appears to be improving compared to yesterday Continue fluid and salt restriction Albumin 2.8 on 12/22/2018  E. coli UTI, POA Continue Rocephin day number 4 out of 5 Monitor urine output Monitor fever curve and WBC Cultures drawn on 12/21/2018 no growth x1 day  Uncontrolled hypertension Blood pressure is better controlled now with the addition of hydralazine Continue increase dose of Norvasc to 10 mg daily Continue hydralazine p.o. 25 mg 3 times daily Continue Lopressor p.o. 25 mg twice daily Continue to monitor vital signs  Obesity BMI 32 Recommend weight loss outpatient when more stable with healthy dieting and regular physical activity  Proximal colitis, possible by CT scan Lactic acid and procalcitonin negative on 12/21/2018 Abdominal x-ray done on 12/22/2018 personally reviewed, no acute findings.  History of seizures During her last admission in January 2020 she was placed on Keppra but this has been discontinued at neurology office.'  Physical debility/ambulatory dysfunction PT OT to assess Fall precautions    DVT prophylaxis:  Subcu Lovenox daily Family communication:  Family not at bedside this morning.  We will call  family. Code status: full   Disposition: Discharge to home when sodium level is close to baseline with hemodynamic stability and nephrology signs off.    Objective: Vitals:   12/23/18 1019 12/23/18 1100 12/23/18 1200 12/23/18 1300  BP: (!) 114/55 (!) 116/99 (!) 146/69 (!) 136/54  Pulse: 81 69 76 75  Resp:  14 18 14   Temp:   97.9 F (36.6 C)   TempSrc:   Oral   SpO2:  100% 93% 98%  Weight:      Height:        Intake/Output Summary (Last 24 hours) at 12/23/2018 1327 Last data filed at 12/23/2018 0300 Gross per 24 hour  Intake 694.95 ml  Output 400 ml  Net 294.95 ml   Filed Weights   12/20/18 0500 12/22/18 0500 12/23/18 0440  Weight: 80.5 kg 84.3 kg 80.4 kg    Exam:  . General: 78 y.o. year-old female well-developed well-nourished no acute distress.  Alert and interactive.   . Cardiovascular: Regular rate and rhythm no rubs or gallops.  No JVD or thyromegaly. Marland Kitchen Respiratory: Clear to Auscultation No Wheezes or Rales.  Poor inspiratory effort.   . Abdomen: Obese distended with hypoactive bowel sounds. Marland Kitchen  Musculoskeletal: Dependent edema in all 4 extremities. Marland Kitchen Psychiatry: Mood is appropriate for condition and setting.  Data Reviewed: CBC: Recent Labs  Lab 12/19/18 0247 12/20/18 0222 12/21/18 0612  WBC 11.7* 9.5 8.3  NEUTROABS 8.0*  --  4.5  HGB 10.2* 10.5* 9.9*  HCT 30.4* 32.2* 31.6*  MCV 88.4 90.2 92.1  PLT 217 190 301   Basic Metabolic Panel: Recent Labs  Lab 12/20/18 0222 12/21/18 0211 12/22/18 0215 12/22/18 1640 12/23/18 0148  NA 124* 127* 124* 127* 126*  K 4.7 4.4 4.3 4.7 4.4  CL 94* 97* 94* 95* 94*  CO2 22 21* 19* 23 20*  GLUCOSE 79 103* 208* 216* 234*  BUN 12 15 18 20 19   CREATININE 1.36* 1.44* 1.53* 1.50* 1.49*  CALCIUM 8.3* 8.1* 7.9* 8.2* 8.2*   GFR: Estimated Creatinine Clearance: 30.7 mL/min (A) (by C-G formula based on SCr of 1.49 mg/dL (H)). Liver Function Tests: Recent Labs  Lab 12/19/18 0247 12/22/18 0215  AST 32  --   ALT 30   --   ALKPHOS 95  --   BILITOT 0.7  --   PROT 7.6  --   ALBUMIN 3.1* 2.8*   Recent Labs  Lab 12/21/18 0630  LIPASE 20   Recent Labs  Lab 12/19/18 0247  AMMONIA 13   Coagulation Profile: No results for input(s): INR, PROTIME in the last 168 hours. Cardiac Enzymes: No results for input(s): CKTOTAL, CKMB, CKMBINDEX, TROPONINI in the last 168 hours. BNP (last 3 results) No results for input(s): PROBNP in the last 8760 hours. HbA1C: Recent Labs    12/21/18 0612  HGBA1C 7.3*   CBG: Recent Labs  Lab 12/22/18 1127 12/22/18 1625 12/22/18 2128 12/23/18 0732 12/23/18 1138  GLUCAP 172* 206* 254* 232* 197*   Lipid Profile: No results for input(s): CHOL, HDL, LDLCALC, TRIG, CHOLHDL, LDLDIRECT in the last 72 hours. Thyroid Function Tests: No results for input(s): TSH, T4TOTAL, FREET4, T3FREE, THYROIDAB in the last 72 hours. Anemia Panel: No results for input(s): VITAMINB12, FOLATE, FERRITIN, TIBC, IRON, RETICCTPCT in the last 72 hours. Urine analysis:    Component Value Date/Time   COLORURINE YELLOW 12/19/2018 0515   APPEARANCEUR CLOUDY (A) 12/19/2018 0515   LABSPEC 1.020 12/19/2018 0515   PHURINE 7.0 12/19/2018 0515   GLUCOSEU 50 (A) 12/19/2018 0515   HGBUR SMALL (A) 12/19/2018 0515   BILIRUBINUR NEGATIVE 12/19/2018 0515   KETONESUR NEGATIVE 12/19/2018 0515   PROTEINUR 100 (A) 12/19/2018 0515   NITRITE POSITIVE (A) 12/19/2018 0515   LEUKOCYTESUR LARGE (A) 12/19/2018 0515   Sepsis Labs: @LABRCNTIP (procalcitonin:4,lacticidven:4)  ) Recent Results (from the past 240 hour(s))  Urine culture     Status: Abnormal (Preliminary result)   Collection Time: 12/19/18  5:15 AM   Specimen: Urine, Catheterized  Result Value Ref Range Status   Specimen Description   Final    URINE, CATHETERIZED Performed at [facility], [place] [address] [place], [place] [address]    Special Requests   Final    NONE Performed at [facility], [place]  [address] [place], [place] [address]    Culture (A)  Final    >=100,000 COLONIES/mL ESCHERICHIA COLI REPEATING TO CONFIRM SUSCEPTIBILITIES Performed at [facility], [place] [address], [place], [place] [address]    Report Status PENDING  Incomplete  SARS CORONAVIRUS 2 Nasal Swab Aptima Multi Swab     Status: None   Collection Time: 12/19/18  5:24 AM   Specimen: Aptima Multi Swab; Nasal  Swab  Result Value Ref Range Status   SARS Coronavirus 2 NEGATIVE NEGATIVE Final    Comment: (NOTE) SARS-CoV-2 target nucleic acids are NOT DETECTED. The SARS-CoV-2 RNA is generally detectable in upper and lower respiratory specimens during the acute phase of infection. Negative results do not preclude SARS-CoV-2 infection, do not rule out co-infections with other pathogens, and should not be used as the sole basis for treatment or other patient management decisions. Negative results must be combined with clinical observations, patient history, and epidemiological information. The expected result is Negative. Fact Sheet for Patients: SugarRoll.be Fact Sheet for Healthcare Providers: https://www.woods-mathews.com/ This test is not yet approved or cleared by the [place] FDA and  has been authorized for detection and/or diagnosis of SARS-CoV-2 by FDA under an Emergency Use Authorization (EUA). This EUA will remain  in effect (meaning this test can be used) for the duration of the COVID-19 declaration under Section 56 4(b)(1) of the Act, 21 U.S.C. section 360bbb-3(b)(1), unless the authorization is terminated or revoked sooner. Performed at [facility], [place] [address], [place], [place] [address]   MRSA PCR Screening     Status: None   Collection Time: 12/19/18 12:29 PM   Specimen: Nasal Mucosa; Nasopharyngeal  Result Value Ref Range Status   MRSA by PCR NEGATIVE NEGATIVE Final    Comment:        The GeneXpert MRSA Assay (FDA approved for  NASAL specimens only), is one component of a comprehensive MRSA colonization surveillance program. It is not intended to diagnose MRSA infection nor to guide or monitor treatment for MRSA infections. Performed at [facility], [place] [address], [place], [place] [address]   Culture, blood (routine x 2)     Status: None (Preliminary result)   Collection Time: 12/21/18  6:12 AM   Specimen: BLOOD RIGHT ARM  Result Value Ref Range Status   Specimen Description   Final    BLOOD RIGHT ARM Performed at [facility] [place] [address], [place], [place] [address]    Special Requests   Final    BOTTLES DRAWN AEROBIC ONLY Blood Culture adequate volume Performed at [place] [address], [place], [place] [address]    Culture   Final    NO GROWTH 2 DAYS Performed at [place] [address] [place], [place] [address]    Report Status PENDING  Incomplete  Culture, blood (routine x 2)     Status: None (Preliminary result)   Collection Time: 12/21/18  6:13 AM   Specimen: BLOOD RIGHT HAND  Result Value Ref Range Status   Specimen Description   Final    BLOOD RIGHT HAND Performed at [place] [address] [place], [place] [address]    Special Requests   Final    BOTTLES DRAWN AEROBIC ONLY Blood Culture adequate volume Performed at [facility] [address], [place], [place] [address]    Culture   Final    NO GROWTH 2 DAYS Performed at [place] [address] [place], [place] [address]    Report Status PENDING  Incomplete      Studies: Dg Abd Portable 1v  Result Date: 12/23/2018 CLINICAL DATA:  Abdominal pain and distention. EXAM: PORTABLE ABDOMEN - 1 VIEW COMPARISON:  None. FINDINGS: The bowel gas pattern is normal. No radio-opaque calculi or other significant radiographic abnormality are seen. Right upper quadrant surgical clips noted. Surgical clip also  noted in the left pelvis. Pelvic vascular calcification also noted IMPRESSION: Unremarkable bowel gas pattern.  No acute findings. Electronically Signed   By: Marlaine Hind M.D.   On: 12/23/2018 08:24    Scheduled Meds: . amLODipine  10 mg Oral Daily  . Chlorhexidine Gluconate Cloth  6 each Topical Daily  . enoxaparin (LOVENOX) injection  40 mg Subcutaneous Q24H  . hydrALAZINE  25 mg Oral Q8H  . insulin aspart  0-15 Units Subcutaneous TID WC  . insulin aspart  0-5 Units Subcutaneous QHS  . insulin glargine  7 Units Subcutaneous Daily  . latanoprost  1 drop Both Eyes QHS  . levothyroxine  25 mcg Oral QAC breakfast  . LORazepam  0.5 mg Intravenous Once  . metoprolol tartrate  25 mg Oral BID  . pantoprazole  40 mg Oral Daily  . rosuvastatin  10 mg Oral QHS  . simethicone  80 mg Oral TID  . sodium bicarbonate  1,300 mg Oral BID  . sodium chloride flush  3 mL Intravenous Q12H    Continuous Infusions: . sodium chloride 10 mL/hr at 12/22/18 1127  . albumin human 25 g (12/23/18 0846)  . cefTRIAXone (ROCEPHIN)  IV 1 g (12/23/18 1152)     LOS: 4 days     Kayleen Memos, MD [facility] Pager [phone]  If 7PM-7AM, please contact night-coverage [email or web address] Password [facility] 12/23/2018, 1:27 PM

## 2018-12-23 NOTE — Progress Notes (Signed)
[facility] Progress Note   Subjective/Interval events:    Afebrile, SBPs range 114-145.  On RA.  I/O 695 / 400 yesterday with albumin 25g IV TID Serum sodium down to 126 this AM, cr 1.49. Abd X ray this AM unrevealing.  Family at bedside this AM - says overall improved but she still c/o abd pain.  She denies worsening edema, dyspnea or orthopnea   Objective Vitals:   12/23/18 0440 12/23/18 0800 12/23/18 1018 12/23/18 1019  BP: (!) 145/64  (!) 114/55 (!) 114/55  Pulse: 84   81  Resp: 17     Temp:  98.1 F (36.7 C)    TempSrc:  Oral    SpO2: 100%     Weight: 80.4 kg     Height:       Physical Exam General: awake, lying at 45degrees in no distress  Heart: RRR, no rub Lungs: clear ant Abdomen: soft, mod distended but no TTP today Extremities: 1+ dependent thigh edema 1+ UE edema, no ankle edema  Additional Objective Labs: Basic Metabolic Panel: Recent Labs  Lab 12/22/18 0215 12/22/18 1640 12/23/18 0148  NA 124* 127* 126*  K 4.3 4.7 4.4  CL 94* 95* 94*  CO2 19* 23 20*  GLUCOSE 208* 216* 234*  BUN 18 20 19   CREATININE 1.53* 1.50* 1.49*  CALCIUM 7.9* 8.2* 8.2*   Liver Function Tests: Recent Labs  Lab 12/19/18 0247 12/22/18 0215  AST 32  --   ALT 30  --   ALKPHOS 95  --   BILITOT 0.7  --   PROT 7.6  --   ALBUMIN 3.1* 2.8*   Recent Labs  Lab 12/21/18 0630  LIPASE 20   CBC: Recent Labs  Lab 12/19/18 0247 12/20/18 0222 12/21/18 0612  WBC 11.7* 9.5 8.3  NEUTROABS 8.0*  --  4.5  HGB 10.2* 10.5* 9.9*  HCT 30.4* 32.2* 31.6*  MCV 88.4 90.2 92.1  PLT 217 190 212   Blood Culture    Component Value Date/Time   SDES  12/21/2018 1610    BLOOD RIGHT HAND Performed at [facility], [place] [address], [place], [place] [address]    SPECREQUEST  12/21/2018 [phone]    BOTTLES DRAWN AEROBIC ONLY Blood Culture adequate volume Performed at [facility] [address] [place], [place] [address]    CULT   12/21/2018 [phone]    NO GROWTH 2 DAYS Performed at [facility], [place] [address], [place], [place] [address]    REPTSTATUS PENDING 12/21/2018 0865    Cardiac Enzymes: No results for input(s): CKTOTAL, CKMB, CKMBINDEX, TROPONINI in the last 168 hours. CBG: Recent Labs  Lab 12/22/18 0740 12/22/18 1127 12/22/18 1625 12/22/18 2128 12/23/18 0732  GLUCAP 132* 172* 206* 254* 232*   Iron Studies: No results for input(s): IRON, TIBC, TRANSFERRIN, FERRITIN in the last 72 hours. @lablastinr3 @ Studies/Results: Dg Abd Portable 1v  Result Date: 12/23/2018 CLINICAL DATA:  Abdominal pain and distention. EXAM: PORTABLE ABDOMEN - 1 VIEW COMPARISON:  None. FINDINGS: The bowel gas pattern is normal. No radio-opaque calculi or other significant radiographic abnormality are seen. Right upper quadrant surgical clips noted. Surgical clip also noted in the left pelvis. Pelvic vascular calcification also noted IMPRESSION: Unremarkable bowel gas pattern.  No acute findings. Electronically Signed   By: Marlaine Hind M.D.   On: 12/23/2018 08:24   Dg Abd Portable 1v  Result Date: 12/22/2018 CLINICAL DATA:  Abdominal pain EXAM: PORTABLE ABDOMEN - 1  VIEW COMPARISON:  06/25/2018 FINDINGS: Scattered large and small bowel gas is noted. Mild retained fecal material is noted. No obstructive changes are seen. No free air is noted. Degenerative changes of lumbar spine are seen. IMPRESSION: No acute abnormality noted. Electronically Signed   By: Alcide CleverMark  Lukens M.D.   On: 12/22/2018 08:48   Medications: . sodium chloride 10 mL/hr at 12/22/18 1127  . albumin human 25 g (12/23/18 0846)  . cefTRIAXone (ROCEPHIN)  IV Stopped (12/22/18 1158)   . amLODipine  10 mg Oral Daily  . Chlorhexidine Gluconate Cloth  6 each Topical Daily  . enoxaparin (LOVENOX) injection  40 mg Subcutaneous Q24H  . hydrALAZINE  25 mg Oral Q8H  . insulin aspart  0-15 Units Subcutaneous TID WC  . insulin aspart  0-5 Units Subcutaneous QHS  . insulin  glargine  7 Units Subcutaneous Daily  . latanoprost  1 drop Both Eyes QHS  . levothyroxine  25 mcg Oral QAC breakfast  . LORazepam  0.5 mg Intravenous Once  . metoprolol tartrate  25 mg Oral BID  . pantoprazole  40 mg Oral Daily  . rosuvastatin  10 mg Oral QHS  . sodium bicarbonate  1,300 mg Oral BID  . sodium chloride flush  3 mL Intravenous Q12H    Assessment/Plan: **Hyponatremia:  Severe with acute on chronic; per care everywhere appear to have chronic mild hyponatremia with BL in the ~130 range. Per exam she is quite volume overloaded and this is consistent with hypervolemic hyponatremia most likely related to cirrhosis (though BP normal to high and no ascites).    With fluid restriction and diuresis had trended up to 127 initially, however down to 124 on 8/14 and in light of poor po intake, suspected intravascular volume depletion despite anasarca and given TID albumin 8/14 with improvement in serum sodium to 126-127.   --Albumin 25g IV TID + lasix 40 IV  --Check 1600 and AM sodium --cont diet with fluid restriction 126000mL/day, 2g sodium diet -Strict I/Os (use purewick or even foley if needed to tract accurately), daily weights ordered -- daily weights ? Bed weights  **anasarca:  Per above likely related to cirrhosis but a bit unusual with her high BP and lack of ascites.  TTE was ok.  Sodium and fluid restriction --Per above concern for intravascular volume depletion with worsening serum sodium and poor po intake. --albumin + lasix today --May need diuretic at discharge to maintain euvolemia but may just need fluid and sodium restriction, TBD  **Abd pain:  Initial CT with possible colitis, no ascites.  Serial abd x rays unrevealing.    **E coli UTI:  On ceftriaxone, culture with e coli, sens still pending.   **HTN: on home BB and amlodipine, ARB on hold for now. Will cont hydralazine 25 BID initiated 8/13.  **CKD3:  Mild AKI now which I suspect is related to mild  intravascular volume depletion.  Volume expansion with IV albumin per above.  **H/o seizure d/o:  Had seizure in similar setting, was started on keppra but d/c'd at neurology f/u.  Correcting underlying issues that may have promoted seizure in past.   Estill BakesLindsay Kruska MD 12/23/2018, 11:36 AM  [facility] Pager: [phone]

## 2018-12-24 DIAGNOSIS — E871 Hypo-osmolality and hyponatremia: Secondary | ICD-10-CM | POA: Diagnosis not present

## 2018-12-24 DIAGNOSIS — R400 Somnolence: Secondary | ICD-10-CM | POA: Diagnosis not present

## 2018-12-24 DIAGNOSIS — R601 Generalized edema: Secondary | ICD-10-CM | POA: Diagnosis not present

## 2018-12-24 DIAGNOSIS — G9341 Metabolic encephalopathy: Secondary | ICD-10-CM | POA: Diagnosis not present

## 2018-12-24 DIAGNOSIS — E039 Hypothyroidism, unspecified: Secondary | ICD-10-CM | POA: Diagnosis not present

## 2018-12-24 LAB — GLUCOSE, CAPILLARY
Glucose-Capillary: 237 mg/dL — ABNORMAL HIGH (ref 70–99)
Glucose-Capillary: 258 mg/dL — ABNORMAL HIGH (ref 70–99)
Glucose-Capillary: 189 mg/dL — ABNORMAL HIGH (ref 70–99)
Glucose-Capillary: 193 mg/dL — ABNORMAL HIGH (ref 70–99)
Glucose-Capillary: 298 mg/dL — ABNORMAL HIGH (ref 70–99)

## 2018-12-24 LAB — CARBAPENEM RESISTANCE PANEL
Carba Resistance IMP Gene: NOT DETECTED
Carba Resistance KPC Gene: NOT DETECTED
Carba Resistance NDM Gene: DETECTED — AB
Carba Resistance OXA48 Gene: NOT DETECTED
Carba Resistance VIM Gene: NOT DETECTED

## 2018-12-24 LAB — BASIC METABOLIC PANEL
Anion gap: 11 (ref 5–15)
BUN: 19 mg/dL (ref 8–23)
CO2: 21 mmol/L — ABNORMAL LOW (ref 22–32)
Calcium: 8.5 mg/dL — ABNORMAL LOW (ref 8.9–10.3)
Chloride: 96 mmol/L — ABNORMAL LOW (ref 98–111)
Creatinine, Ser: 1.41 mg/dL — ABNORMAL HIGH (ref 0.44–1.00)
GFR calc Af Amer: 41 mL/min — ABNORMAL LOW (ref >60)
GFR calc non Af Amer: 35 mL/min — ABNORMAL LOW (ref >60)
Glucose, Bld: 145 mg/dL — ABNORMAL HIGH (ref 70–99)
Potassium: 5.3 mmol/L — ABNORMAL HIGH (ref 3.5–5.1)
Sodium: 128 mmol/L — ABNORMAL LOW (ref 135–145)

## 2018-12-24 LAB — RETICULOCYTES
Immature Retic Fract: 21.4 % — ABNORMAL HIGH (ref 2.3–15.9)
RBC.: 2.86 MIL/uL — ABNORMAL LOW (ref 3.87–5.11)
Retic Count, Absolute: 48.6 10*3/uL (ref 19.0–186.0)
Retic Ct Pct: 1.7 % (ref 0.4–3.1)

## 2018-12-24 LAB — URINE CULTURE: Culture: >=100000 — AB

## 2018-12-24 LAB — CBC WITH DIFFERENTIAL/PLATELET
Abs Immature Granulocytes: 0.03 10*3/uL (ref 0.00–0.07)
Basophils Absolute: 0 10*3/uL (ref 0.0–0.1)
Basophils Relative: 0 %
Eosinophils Absolute: 0.3 10*3/uL (ref 0.0–0.5)
Eosinophils Relative: 3 %
HCT: 26.4 % — ABNORMAL LOW (ref 36.0–46.0)
Hemoglobin: 8.4 g/dL — ABNORMAL LOW (ref 12.0–15.0)
Immature Granulocytes: 0 %
Lymphocytes Relative: 19 %
Lymphs Abs: 1.6 10*3/uL (ref 0.7–4.0)
MCH: 28.9 pg (ref 26.0–34.0)
MCHC: 31.8 g/dL (ref 30.0–36.0)
MCV: 90.7 fL (ref 80.0–100.0)
Monocytes Absolute: 0.8 10*3/uL (ref 0.1–1.0)
Monocytes Relative: 9 %
Neutro Abs: 5.7 10*3/uL (ref 1.7–7.7)
Neutrophils Relative %: 69 %
Platelets: 158 10*3/uL (ref 150–400)
RBC: 2.91 MIL/uL — ABNORMAL LOW (ref 3.87–5.11)
RDW: 14.4 % (ref 11.5–15.5)
WBC: 8.5 10*3/uL (ref 4.0–10.5)
nRBC: 0 % (ref 0.0–0.2)

## 2018-12-24 LAB — FERRITIN: Ferritin: 55 ng/mL (ref 11–307)

## 2018-12-24 LAB — IRON AND TIBC
Iron: 32 ug/dL (ref 28–170)
Saturation Ratios: 14 % (ref 10.4–31.8)
TIBC: 222 ug/dL — ABNORMAL LOW (ref 250–450)
UIBC: 190 ug/dL

## 2018-12-24 LAB — OCCULT BLOOD X 1 CARD TO LAB, STOOL: Fecal Occult Bld: NEGATIVE

## 2018-12-24 LAB — VITAMIN B12: Vitamin B-12: 354 pg/mL (ref 180–914)

## 2018-12-24 MED ORDER — BISACODYL 10 MG RE SUPP: 10.0000 mg | Freq: Once | RECTAL | Status: DC

## 2018-12-24 MED ORDER — GLUCERNA SHAKE PO LIQD
237.0000 mL | Freq: Three times a day (TID) | ORAL | Status: DC
  Administered 2018-12-24 – 2018-12-26 (×2): 237 mL via ORAL
  Filled 2018-12-24 (×14): qty 237

## 2018-12-24 MED ORDER — HYDROMORPHONE HCL 1 MG/ML IJ SOLN
0.5000 mg | Freq: Once | INTRAMUSCULAR | Status: AC
  Administered 2018-12-24: 10:00:00 0.5 mg via INTRAVENOUS
  Filled 2018-12-24: qty 1

## 2018-12-24 MED ORDER — FOSFOMYCIN TROMETHAMINE 3 G PO PACK
3.0000 g | PACK | Freq: Once | ORAL | Status: AC
  Administered 2018-12-24: 19:00:00 3 g via ORAL
  Filled 2018-12-24: qty 3

## 2018-12-24 MED ORDER — HYDRALAZINE HCL 50 MG PO TABS
50.0000 mg | ORAL_TABLET | Freq: Three times a day (TID) | ORAL | Status: DC
  Administered 2018-12-24 – 2018-12-26 (×7): 50 mg via ORAL
  Filled 2018-12-24 (×9): qty 1

## 2018-12-24 MED ORDER — FUROSEMIDE 10 MG/ML IJ SOLN
40.0000 mg | Freq: Once | INTRAMUSCULAR | Status: AC
  Administered 2018-12-24: 14:00:00 40 mg via INTRAVENOUS
  Filled 2018-12-24: qty 4

## 2018-12-24 MED ORDER — ALBUMIN HUMAN 25 % IV SOLN
25.0000 g | Freq: Three times a day (TID) | INTRAVENOUS | Status: DC
  Administered 2018-12-24 – 2018-12-25 (×2): 25 g via INTRAVENOUS
  Filled 2018-12-24 (×2): qty 100

## 2018-12-24 MED ORDER — OXYCODONE HCL 5 MG PO TABS
5.0000 mg | ORAL_TABLET | ORAL | Status: DC | PRN
  Administered 2018-12-25 – 2018-12-28 (×4): 5 mg via ORAL
  Filled 2018-12-24 (×4): qty 1

## 2018-12-24 MED ORDER — HYDROMORPHONE HCL 1 MG/ML IJ SOLN: 0.5000 mg | INTRAMUSCULAR | Status: DC | PRN

## 2018-12-24 MED ORDER — BISACODYL 10 MG RE SUPP
10.0000 mg | Freq: Once | RECTAL | Status: AC
  Administered 2018-12-24: 20:00:00 10 mg via RECTAL
  Filled 2018-12-24: qty 1

## 2018-12-24 MED ORDER — ADULT MULTIVITAMIN W/MINERALS CH
1.0000 | ORAL_TABLET | Freq: Every day | ORAL | Status: DC
  Administered 2018-12-24 – 2018-12-26 (×3): 1 via ORAL
  Filled 2018-12-24 (×2): qty 1

## 2018-12-24 NOTE — Consult Note (Signed)
Referring Provider:  Dr.  Lacretia Nicksaldwell Powell Primary Care Physician:  Ron ParkerJenkins, Harvette C, MD (Inactive) Primary Gastroenterologist: None (unassigned; had previously been seen by inpatient gastroenterologist at [facility])  Reason for Consultation: Abdominal pain  HPI: Lori Hardy is a 78 y.o. female with cryptogenic cirrhosis, presumably NASH, given history of diabetes, diagnosed several years ago at [facility].    The patient is from [place] and speaks a language called [place]jar[place], for which the interpreting service did not readily have an interpreter.  However, the patient's son was at the bedside and was able to provide useful history.    Basically, it sounds as though the patient has been having decreased functional status over the past several months, taking her meals in bed instead of getting up for meals.  They have noticed during this time a gradual increase in abdominal size, with the patient eating a lot but also, more recently, having fluid retention including swelling of the legs.  There has been no constipation.    More recent symptoms have included abdominal discomfort prompting the use of application of a topical gel from [place] that seems to help the pain, as well as use of a heating pad.  She has been having some pruritus and, today, also some gagging but there is no antecedent history of reflux symptoms.  The patient was finally brought into the hospital several days ago when she had altered mental status, associated with hyponatremia, something which had happened roughly 6 months earlier.  Since then, there has been improvement in the hyponatremia but the patient has persistent abdominal pain.    A CT scan on admission shows atherosclerotic changes in the SMA and a question of possible colitis involving the proximal colon, but interestingly, did not show any evidence of ascites despite the patient's anasarca and abdominal distention.  Of note, the  patient had an echo recently which does not show any significant cardiac dysfunction or right heart failure.   Past Medical History:  Diagnosis Date  . Aortic atherosclerosis (HCC) 12/15/2015   Overview:  By imaging, xray 11/2015.  . Closed fracture of right proximal humerus 04/06/2017  . Compression fracture of L4 lumbar vertebra, closed, initial encounter (HCC) 01/12/2017  . Diabetes mellitus without complication (HCC)   . Exposure to TB 09/26/2015  . 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  Differential includes diet, medications, possible pancreatic insufficiency.  Negative C. difficile 09/27/2015  . H. pylori infection 10/07/2016   Overview:  BX confirmed. 09/15/2016- Dr. Noe GensPeters, [facility].   Marland Kitchen. History of compression fracture of spine 11/20/2015   Overview:  T12, on prolia.  Marland Kitchen. History of Helicobacter pylori infection 11/20/2015   Overview:  By EGD 2017.  [facility] with [facility]  . Hx of pancreatitis 09/26/2015   Overview:  08/2015 [facility]  . Hypertension   . Hyponatremia 04/18/2017  . 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 No active disease on chest xray 11/25/15. Pt from [place], past exposure to TB. Pt agreed to referral and treatment by [facility]- referral sent 12/15/2015 (telephone [phone], fax #[phone]).   . Metabolic encephalopathy   . 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 Pseudophakia  06/11/2015  . Seasonal allergic rhinitis 09/02/2016  . Senile osteoporosis 06/26/2015   Overview:  Bone density 12/2014 [facility]. T-4.4 and -3.8. On prolia managed by Dr. Posey Pronto.  Normal intact PTH in 2017.  Marland Kitchen Thrombocytopenia ([place]) 09/26/2015  . Xerosis of  skin 04/14/2016    Past Surgical History:  Procedure Laterality Date  . cataracts      Prior to Admission medications   Medication Sig Start Date End Date Taking? Authorizing Provider  amLODipine (NORVASC) 5 MG tablet Take 1 tablet (5 mg total) by mouth daily. 06/27/18   Angiulli, Lavon Paganini, PA-C  glipiZIDE (GLUCOTROL) 10 MG tablet Take 10 mg by mouth 2 (two) times daily. 09/03/18   [provider]  Insulin Glargine (BASAGLAR KWIKPEN) 100 UNIT/ML SOPN Inject 14 Units into the skin daily. 11/27/18   [provider]  latanoprost (XALATAN) 0.005 % ophthalmic solution Place 1 drop into both eyes at bedtime. 06/17/18   [provider]  levothyroxine (SYNTHROID, LEVOTHROID) 25 MCG tablet Take 1 tablet (25 mcg total) by mouth daily before breakfast. 06/27/18   Angiulli, Lavon Paganini, PA-C  losartan (COZAAR) 100 MG tablet Take 0.5 tablets (50 mg total) by mouth daily. Patient taking differently: Take 100 mg by mouth daily.  06/27/18   Angiulli, Lavon Paganini, PA-C  metoprolol tartrate (LOPRESSOR) 25 MG tablet Take 1 tablet (25 mg total) by mouth 2 (two) times daily. 06/27/18   Angiulli, Lavon Paganini, PA-C  omeprazole (PRILOSEC) 40 MG capsule Take 40 mg by mouth daily.    [provider]  pioglitazone (ACTOS) 45 MG tablet Take 45 mg by mouth daily. 09/03/18   [provider]  rosuvastatin (CRESTOR) 10 MG tablet Take 1 tablet (10 mg total) by mouth at bedtime. 06/27/18   Angiulli, Lavon Paganini, PA-C    Current Facility-Administered Medications  Medication Dose Route Frequency Provider Last Rate Last Dose  . 0.9 %  sodium chloride infusion  250 mL Intravenous PRN Roney Jaffe, MD 10 mL/hr at 12/22/18 1127    . acetaminophen (TYLENOL) tablet 650 mg  650 mg Oral Q6H PRN Roney Jaffe, MD   650 mg at 12/23/18 0820   Or  . acetaminophen (TYLENOL) suppository 650 mg  650 mg Rectal Q6H PRN Roney Jaffe, MD      . albumin human 25 % solution 25 g  25 g Intravenous Q8H Jannifer Hick A, MD      . amLODipine (NORVASC) tablet 10 mg  10 mg Oral Daily Irene Pap N, DO   10 mg at 12/24/18 0948  . bisacodyl (DULCOLAX) suppository 10 mg  10 mg Rectal Once Brewster Wolters, MD      . cefTRIAXone (ROCEPHIN) 1 g in sodium chloride 0.9 % 100 mL IVPB  1 g Intravenous Q24H Roney Jaffe, MD 200 mL/hr at 12/24/18 1441 1 g at 12/24/18 1441  . Chlorhexidine Gluconate Cloth 2 % PADS 6 each  6 each Topical Daily Roney Jaffe, MD   6 each at 12/24/18 [phone]  . enoxaparin (LOVENOX) injection 40 mg  40 mg Subcutaneous Q24H Hall, Carole N, DO   40 mg at 12/24/18 1042  . feeding supplement (GLUCERNA SHAKE) (GLUCERNA SHAKE) liquid 237 mL  237 mL Oral TID BM Hall, Carole N, DO   237 mL at 12/24/18 1341  . hydrALAZINE (APRESOLINE) tablet 50 mg  50 mg Oral Q8H Justin Mend, MD   50 mg at 12/24/18 1339  . HYDROmorphone (DILAUDID) injection 0.5 mg  0.5 mg Intravenous Q4H  PRN Dow AdolphHall, Carole N, DO      . insulin aspart (novoLOG) injection 0-15 Units  0-15 Units Subcutaneous TID WC Dow AdolphHall, Carole N, DO   3 Units at 12/24/18 1256  . insulin aspart (novoLOG) injection 0-5 Units  0-5 Units Subcutaneous QHS Darlin DropHall, Carole N, DO   3 Units at 12/22/18 2139  . insulin glargine (LANTUS) injection 7 Units  7 Units Subcutaneous Daily Darlin DropHall, Carole N, DO   7 Units at 12/24/18 [id number]  . latanoprost (XALATAN) 0.005 % ophthalmic solution 1 drop  1 drop Both Eyes QHS Delano MetzSchertz, Miyanna Wiersma, MD   1 drop at 12/23/18 2143  . levothyroxine (SYNTHROID) tablet 25 mcg  25 mcg Oral QAC breakfast Delano MetzSchertz, Sohana Austell, MD   25 mcg at 12/24/18 0525  . LORazepam (ATIVAN) injection 0.5 mg  0.5 mg Intravenous Once Kirby-Graham, Beather ArbourKaren J, NP      . metoprolol tartrate (LOPRESSOR) tablet 25 mg  25 mg Oral BID Delano MetzSchertz, Briseidy Spark, MD   25 mg at 12/24/18 0948  . multivitamin with minerals tablet 1 tablet  1 tablet Oral Daily Dow AdolphHall, Carole N, DO   1 tablet at 12/24/18 1341  . ondansetron (ZOFRAN) tablet 4 mg  4 mg Oral Q6H PRN Delano MetzSchertz, Galina Haddox, MD        Or  . ondansetron [facility](ZOFRAN) injection 4 mg  4 mg Intravenous Q6H PRN Delano MetzSchertz, Jayce Boyko, MD   4 mg at 12/24/18 1435  . oxyCODONE (Oxy IR/ROXICODONE) immediate release tablet 5 mg  5 mg Oral Q4H PRN Dow AdolphHall, Carole N, DO      . pantoprazole (PROTONIX) EC tablet 40 mg  40 mg Oral Daily Delano MetzSchertz, Melda Mermelstein, MD   40 mg at 12/24/18 0946  . polyethylene glycol (MIRALAX / GLYCOLAX) packet 17 g  17 g Oral BID Dow AdolphHall, Carole N, DO   17 g at 12/24/18 0950  . rosuvastatin (CRESTOR) tablet 10 mg  10 mg Oral QHS Delano MetzSchertz, Eamonn Sermeno, MD   10 mg at 12/23/18 2142  . senna-docusate (Senokot-S) tablet 2 tablet  2 tablet Oral BID Dow AdolphHall, Carole N, DO   2 tablet at 12/24/18 0946  . simethicone (MYLICON) chewable tablet 80 mg  80 mg Oral TID Dow AdolphHall, Carole N, DO   80 mg at 12/24/18 0948  . sodium chloride flush (NS) 0.9 % injection 3 mL  3 mL Intravenous Q12H Delano MetzSchertz, Brandin Stetzer, MD   3 mL at 12/24/18 0950  . sodium chloride flush (NS) 0.9 % injection 3 mL  3 mL Intravenous PRN Delano MetzSchertz, Shawneequa Baldridge, MD   3 mL at 12/23/18 0830    Allergies as of 12/19/2018 - Review Complete 12/19/2018  Allergen Reaction Noted  . Brimonidine Itching 06/11/2015    History reviewed. No pertinent family history.  Social History   Socioeconomic History  . Marital status: Widowed    Spouse name: Not on file  . Number of children: Not on file  . Years of education: Not on file  . Highest education level: Not on file  Occupational History  . Not on file  Social Needs  . Financial resource strain: Not on file  . Food insecurity    Worry: Not on file    Inability: Not on file  . Transportation needs    Medical: Not on file    Non-medical: Not on file  Tobacco Use  . Smoking status: Never Smoker  . Smokeless tobacco: Never Used  Substance and Sexual Activity  . Alcohol use: Not on file  . Drug use: Not on  file  . Sexual activity: Not on file  Lifestyle  . Physical activity    Days per week: Not on file    Minutes per session: Not on file  .  Stress: Not on file  Relationships  . Social Musician on phone: Not on file    Gets together: Not on file    Attends religious service: Not on file    Active member of club or organization: Not on file    Attends meetings of clubs or organizations: Not on file    Relationship status: Not on file  . Intimate partner violence    Fear of current or ex partner: Not on file    Emotionally abused: Not on file    Physically abused: Not on file    Forced sexual activity: Not on file  Other Topics Concern  . Not on file  Social History Narrative   Lives with Daughter in law [place], son, two grandkids, 27 & 28 years old.    Daily care giver [facility], sees patient daily for 2 hours.     Review of Systems: See HPI  Physical Exam: Vital signs in last 24 hours: Temp:  [97.6 F (36.4 C)-99 F (37.2 C)] 99 F (37.2 C) (08/16 1244) Pulse Rate:  [74-95] 79 (08/16 1600) Resp:  [16-28] 18 (08/16 1600) BP: (109-189)/(40-101) 183/59 (08/16 1600) SpO2:  [91 %-100 %] 97 % (08/16 1600) Weight:  [86.4 kg] 86.4 kg (08/16 0500) Last BM Date: (unable to assess due to language barrier)  This is an overweight (?  Due to anasarca?)  [place] female in no acute distress, but periodically moaning or calling out, seemingly incoherently.  She is scratching her forearms due to pruritus.  She is anicteric and without overt pallor.  Chest is clear anteriorly, heart normal, abdomen rather distended and moderately tympanitic but without ascites or mass-effect, guarding or tenderness.  At present, lower extremities are not significantly swollen.  No evident focal neurologic deficits.  Rectal exam shows a mild fecal impaction of firm but not hard brown stool, submitted to the lab for Hemoccult testing.  Intake/Output from previous day: 08/15 0701 - 08/16 0700 In: 540 [P.O.:240; IV Piggyback:300] Out: 750 [Urine:750] Intake/Output this shift: Total I/O In: 73.3 [IV Piggyback:73.3] Out: -   Lab  Results: Recent Labs    12/24/18 0738  WBC 8.5  HGB 8.4*  HCT 26.4*  PLT 158   BMET Recent Labs    12/22/18 1640 12/23/18 0148 12/24/18 0530  NA 127* 126* 128*  K 4.7 4.4 5.3*  CL 95* 94* 96*  CO2 23 20* 21*  GLUCOSE 216* 234* 145*  BUN 20 19 19   CREATININE 1.50* 1.49* 1.41*  CALCIUM 8.2* 8.2* 8.5*   LFT Recent Labs    12/22/18 0215  ALBUMIN 2.8*   PT/INR No results for input(s): LABPROT, INR in the last 72 hours.  Studies/Results: Dg Abd Portable 1v  Result Date: 12/23/2018 CLINICAL DATA:  Abdominal pain and distention. EXAM: PORTABLE ABDOMEN - 1 VIEW COMPARISON:  None. FINDINGS: The bowel gas pattern is normal. No radio-opaque calculi or other significant radiographic abnormality are seen. Right upper quadrant surgical clips noted. Surgical clip also noted in the left pelvis. Pelvic vascular calcification also noted IMPRESSION: Unremarkable bowel gas pattern.  No acute findings. Electronically Signed   By: Danae Orleans M.D.   On: 12/23/2018 08:24    Impression: 1.  Cryptogenic cirrhosis, without ascites 2.  Nonspecific abdominal pain  3.  Fecal impaction, mild, treated with digital disimpaction 4.  CT evidence of possible mild proximal colitis in the distribution of the SMA (which is atherosclerotic in this patient), raising the question of possible ischemic colitis. 5.  Anasarca, hyponatremia  Plan: 1.  K pad for abdominal pain 2. Suppository (in addition to existing laxative regimen) to facilitate defecation. 3.  The patient's son is anxious to take the patient back home when she is ready; it is unclear to what degree her decreased functional status may reflect her underlying liver disease, in which case a palliative care discussion may be worthwhile 4.  Await stool Hemoccult result.  Given the radiographic abnormality of her colon and her known SMA atherosclerosis, it is conceivable she has proximal ischemic colitis.  If so, colonoscopy could be considered;  however, it is not clear that putting this patient through a colonoscopy would be either necessary or beneficial, since ischemic colitis (which could be accounting for her abdominal pain) typically resolve spontaneously. 5.  It is unclear whether this patient's abdominal pain has a discrete causation, such as fecal obstipation or ischemic colitis, or whether it is more functional in character.  It seems rather nonspecific, and of course the language barrier makes it even more difficult to give a clear understanding of what the pain is like. 6.  Continue PPI therapy although it is not clear that the patient's gagging or retching is related to GERD.   LOS: 5 days    Katy FitchRobert V Semaya Vida  12/24/2018, 4:13 PM   Pager [phone] If no answer or after 5 PM call [phone]

## 2018-12-24 NOTE — Progress Notes (Signed)
[facility] Progress Note   Subjective/Interval events:    Afebrile, SBPs range 150-180s now overnight.  On RA.  I/O 540 / 750 yesterday with albumin 25g IV TID + lasix 40 IV Serum sodium up to 128 this AM, cr 1.41. Abd pain persists - GI consult called today   Objective Vitals:   12/24/18 0710 12/24/18 0800 12/24/18 0900 12/24/18 1000  BP:  (!) 174/70 (!) 189/87   Pulse:  92 92 90  Resp:  (!) 21 19 17   Temp: 97.7 F (36.5 C)     TempSrc: Axillary     SpO2:  92% 95% 91%  Weight:      Height:       Physical Exam General: awake, lying at 45degrees in no distress  Heart: RRR, no rub Lungs: clear ant Abdomen: soft, mod distended, mildly TTP Extremities: 1+ dependent thigh edema 1+ UE edema, no ankle edema  Additional Objective Labs: Basic Metabolic Panel: Recent Labs  Lab 12/22/18 1640 12/23/18 0148 12/24/18 0530  NA 127* 126* 128*  K 4.7 4.4 5.3*  CL 95* 94* 96*  CO2 23 20* 21*  GLUCOSE 216* 234* 145*  BUN 20 19 19   CREATININE 1.50* 1.49* 1.41*  CALCIUM 8.2* 8.2* 8.5*   Liver Function Tests: Recent Labs  Lab 12/19/18 0247 12/22/18 0215  AST 32  --   ALT 30  --   ALKPHOS 95  --   BILITOT 0.7  --   PROT 7.6  --   ALBUMIN 3.1* 2.8*   Recent Labs  Lab 12/21/18 0630  LIPASE 20   CBC: Recent Labs  Lab 12/19/18 0247 12/20/18 0222 12/21/18 0612 12/24/18 0738  WBC 11.7* 9.5 8.3 8.5  NEUTROABS 8.0*  --  4.5 5.7  HGB 10.2* 10.5* 9.9* 8.4*  HCT 30.4* 32.2* 31.6* 26.4*  MCV 88.4 90.2 92.1 90.7  PLT 217 190 212 158   Blood Culture    Component Value Date/Time   SDES  12/21/2018 [id number]    BLOOD RIGHT HAND Performed at [facility], [address] [place], [place] [address]    SPECREQUEST  12/21/[phone]    BOTTLES DRAWN AEROBIC ONLY Blood Culture adequate volume Performed at [facility], [address] [place], [place] [address]    CULT  12/21/2018 [phone]    NO GROWTH 3 DAYS Performed at  [facility], [address] [place], [place] [address]    REPTSTATUS PENDING 12/21/2018 [id number]    Cardiac Enzymes: No results for input(s): CKTOTAL, CKMB, CKMBINDEX, TROPONINI in the last 168 hours. CBG: Recent Labs  Lab 12/23/18 0732 12/23/18 1138 12/23/18 2145 12/24/18 0745 12/24/18 1118  GLUCAP 232* 197* 185* 258* 189*   Iron Studies: No results for input(s): IRON, TIBC, TRANSFERRIN, FERRITIN in the last 72 hours. @lablastinr3 @ Studies/Results: Dg Abd Portable 1v  Result Date: 12/23/2018 CLINICAL DATA:  Abdominal pain and distention. EXAM: PORTABLE ABDOMEN - 1 VIEW COMPARISON:  None. FINDINGS: The bowel gas pattern is normal. No radio-opaque calculi or other significant radiographic abnormality are seen. Right upper quadrant surgical clips noted. Surgical clip also noted in the left pelvis. Pelvic vascular calcification also noted IMPRESSION: Unremarkable bowel gas pattern.  No acute findings. Electronically Signed   By: Danae OrleansJohn A Stahl M.D.   On: 12/23/2018 08:24   Medications: . sodium chloride 10 mL/hr at 12/22/18 1127  . albumin human 25 g (12/24/18 0953)  . cefTRIAXone (ROCEPHIN)  IV Stopped (12/23/18 1222)   .  amLODipine  10 mg Oral Daily  . Chlorhexidine Gluconate Cloth  6 each Topical Daily  . enoxaparin (LOVENOX) injection  40 mg Subcutaneous Q24H  . hydrALAZINE  25 mg Oral Q8H  . insulin aspart  0-15 Units Subcutaneous TID WC  . insulin aspart  0-5 Units Subcutaneous QHS  . insulin glargine  7 Units Subcutaneous Daily  . latanoprost  1 drop Both Eyes QHS  . levothyroxine  25 mcg Oral QAC breakfast  . LORazepam  0.5 mg Intravenous Once  . metoprolol tartrate  25 mg Oral BID  . pantoprazole  40 mg Oral Daily  . polyethylene glycol  17 g Oral BID  . rosuvastatin  10 mg Oral QHS  . senna-docusate  2 tablet Oral BID  . simethicone  80 mg Oral TID  . sodium chloride flush  3 mL Intravenous Q12H    Assessment/Plan: **Hyponatremia:  Severe with acute on  chronic; per care everywhere appear to have chronic mild hyponatremia with BL in the ~130 range. Per exam she is quite volume overloaded and this is consistent with hypervolemic hyponatremia most likely related to cirrhosis (though BP normal to high and no ascites).    With fluid restriction and diuresis had trended up to 127 initially, however down to 124 on 8/14 and in light of poor po intake, suspected intravascular volume depletion despite anasarca and given TID albumin 8/14 with improvement in serum sodium to 126-127 now improved further with IV albumin and diuresis yesterday   --Cont Albumin 25g IV TID + lasix 40 IV today --cont diet with fluid restriction 1247mL/day, 2g sodium diet -Strict I/Os (use purewick or even foley if needed to tract accurately), daily weights ordered -- daily weights variable ? Bed weights  **anasarca:  Per above likely related to cirrhosis but a bit unusual with her high BP and lack of ascites.  TTE was ok.  Sodium and fluid restriction --albumin + lasix today --May need diuretic at discharge to maintain euvolemia but may just need fluid and sodium restriction, TBD  **Abd pain:  Initial CT with possible colitis, no ascites.  Serial abd x rays unrevealing.  GI consult pending.    **E coli UTI:  On ceftriaxone, culture with e coli, sens still pending.   **HTN: on home BB and amlodipine, ARB on hold for now. Will increase hydralazine 50 BID today.  **CKD3:  Mild AKI now which I suspect is related to mild intravascular volume depletion.  Volume expansion with IV albumin per above.  **H/o seizure d/o:  Had seizure in similar setting, was started on keppra but d/c'd at neurology f/u.  Correcting underlying issues that may have promoted seizure in past.   Jannifer Hick MD 12/24/2018, 11:31 AM  [facility] Pager: [phone]

## 2018-12-24 NOTE — Evaluation (Signed)
Occupational Therapy Evaluation Patient Details Name: Lori Hardy Vonbehren MRN: [id number] DOB: 1940/10/16 Today's Date: 12/24/2018    History of Present Illness Lori Hardy Haji is a 78 y.o. female with hx of DM2, HTN and cirrhosis brought to ED by family w/ AMS, nausea and abd distension. Became confused at home.  Hx of low Na in the past, admitted for this in Feb 2020.   Clinical Impression   Pt admitted with above diagnoses, with AMS and generalized weakness limiting ability to engage in BADL at desired elvel of ind. Son present and translating for pt, reports she speaks no english. Pt is baseline total care for BADLs, usually uses wheelchair when she does leave the bed. At this time pt is mod A +2 for bed mobility- deferred standing this date due to pt fatigue. Will continue to follow to return pt to PLOF for family to care at home, otherwise no OT follow up. Pt has HHA 4 hours a day 7x per week at baseline. Will continue to follow while acute per POC listed below.     Follow Up Recommendations  Supervision/Assistance - 24 hour    Equipment Recommendations  None recommended by OT    Recommendations for Other Services       Precautions / Restrictions Precautions Precautions: Fall Precaution Comments: speaks no english Restrictions Weight Bearing Restrictions: No      Mobility Bed Mobility Overal bed mobility: Needs Assistance Bed Mobility: Supine to Sit     Supine to sit: Mod assist;+2 for physical assistance;+2 for safety/equipment     General bed mobility comments: need significanty encouragement and initation of BLE translation to EOB; use of bed pad to move hips  Transfers                 General transfer comment: attmepted, pt trying to lay down several times so deferred this date    Balance Overall balance assessment: Needs assistance Sitting-balance support: Bilateral upper extremity supported;Single extremity supported Sitting balance-Leahy Scale:  Poor Sitting balance - Comments: poor safety awareness, will lie down without warning                                   ADL either performed or assessed with clinical judgement   ADL Overall ADL's : Needs assistance/impaired Eating/Feeding: Moderate assistance;Sitting   Grooming: Moderate assistance;Sitting   Upper Body Bathing: Total assistance Upper Body Bathing Details (indicate cue type and reason): shower table baseline Lower Body Bathing: Total assistance Lower Body Bathing Details (indicate cue type and reason): shower table baseline Upper Body Dressing : Total assistance   Lower Body Dressing: Total assistance   Toilet Transfer: Total assistance   Toileting- Clothing Manipulation and Hygiene: Total assistance   Tub/ Shower Transfer: Total assistance   Functional mobility during ADLs: Moderate assistance;+2 for physical assistance;+2 for safety/equipment;Rolling walker General ADL Comments: mod A +2 for functional mobility but baseline total care for BADL     Vision Patient Visual Report: No change from baseline       Perception     Praxis      Pertinent Vitals/Pain Pain Assessment: No/denies pain     Hand Dominance     Extremity/Trunk Assessment Upper Extremity Assessment Upper Extremity Assessment: RUE deficits/detail;LUE deficits/detail RUE Deficits / Details: roughly 3-/5 LUE Deficits / Details: roughly 3-/5   Lower Extremity Assessment Lower Extremity Assessment: Generalized weakness       Communication Communication Communication:  Prefers language other than English   Cognition Arousal/Alertness: Awake/alert Behavior During Therapy: WFL for tasks assessed/performed Overall Cognitive Status: Impaired/Different from baseline                                 General Comments: difficult to fully assess given language barrier. Pt appears disoriented to situation, family cont to endorse confusion stating pt not at  baseline. Pt scratching OT this date in attempts to communicate   General Comments       Exercises     Shoulder Instructions      Home Living Family/patient expects to be discharged to:: Private residence Living Arrangements: Children Available Help at Discharge: Family;Available 24 hours/day Type of Home: House Home Access: Stairs to enter [employer] of Steps: 4 Entrance Stairs-Rails: Can reach both Home Layout: Multi-level;Able to live on main level with bedroom/bathroom;Full bath on main level;Laundry or work area in basement     [employer] Shower/Tub: [facility];Other (comment)(has shower table)   Bathroom Toilet: Standard     Home Equipment: Walker - 2 wheels;Wheelchair - [employer];Other (comment)(shower table)   Additional Comments: has aid 4 hours per day x7 days per week      Prior Functioning/Environment Level of Independence: Needs assistance  Gait / Transfers Assistance Needed: RW to wheelchair, son reports pt "never" gets out of bed ADL's / Homemaking Assistance Needed: baseline total care for dressing, bathing, toileting, etc.            OT Problem List: Decreased strength;Decreased knowledge of use of DME or AE;Decreased range of motion;Decreased activity tolerance;Decreased cognition;Impaired balance (sitting and/or standing);Decreased safety awareness      OT Treatment/Interventions: Self-care/ADL training;Therapeutic exercise;Patient/family education;Balance training;Energy conservation;Therapeutic activities;DME and/or AE instruction    OT Goals(Current goals can be found in the care plan section) Acute Rehab OT Goals Patient Stated Goal: unable to state OT Goal Formulation: Patient unable to participate in goal setting Time For Goal Achievement: 01/07/19 Potential to Achieve Goals: Good  OT Frequency: Min 1X/week   Barriers to D/C:            Co-evaluation PT/OT/SLP Co-Evaluation/Treatment: Yes Reason for  Co-Treatment: Complexity of the patient's impairments (multi-system involvement);Necessary to address cognition/behavior during functional activity;For patient/therapist safety;To address functional/ADL transfers PT goals addressed during session: Mobility/safety with mobility OT goals addressed during session: ADL's and self-care      AM-PAC OT "6 Clicks" Daily Activity     Outcome Measure Help from another person eating meals?: A Lot Help from another person taking care of personal grooming?: A Lot Help from another person toileting, which includes using toliet, bedpan, or urinal?: Total Help from another person bathing (including washing, rinsing, drying)?: Total Help from another person to put on and taking off regular upper body clothing?: Total Help from another person to put on and taking off regular lower body clothing?: Total 6 Click Score: 8   End of Session Equipment Utilized During Treatment: Gait belt Nurse Communication: Mobility status  Activity Tolerance: Patient tolerated treatment well Patient left: with call bell/phone within reach;with family/visitor present;in bed  OT Visit Diagnosis: Other abnormalities of gait and mobility (R26.89);Muscle weakness (generalized) (M62.81)                Time: 1025-8527 OT Time Calculation (min): 24 min Charges:  OT General Charges $OT Visit: 1 Visit OT Evaluation $OT Eval Moderate Complexity: 1 Mod  Zenovia Jarred, MSOT, OTR/L Behavioral  Health OT/ Acute Relief OT [facility] Office: [phone]   Dalphine HandingKaylee Lillianne Eick 12/24/2018, 4:48 PM

## 2018-12-24 NOTE — Progress Notes (Signed)
Initial Nutrition Assessment  INTERVENTION:   -Glucerna Shake po TID, each supplement provides 220 kcal and 10 grams of protein -Multivitamin with minerals daily  NUTRITION DIAGNOSIS:   Increased nutrient needs related to chronic illness(cirrhosis) as evidenced by estimated needs.  GOAL:   Patient will meet greater than or equal to 90% of their needs  MONITOR:   PO intake, Supplement acceptance, Labs, Weight trends, I & O's, Skin  REASON FOR ASSESSMENT:   Consult Assessment of nutrition requirement/status  ASSESSMENT:   78 y.o. female with hx of DM2, HTN and cirrhosis brought to ED by family w/ AMS, nausea and abd distension.  **RD working remotely**  Per family, pt had increased confusion PTA with nausea. Pt mainly drank water and juice. Pt continues to have abdominal pain today. Pt continues to mainly consume liquids such as jello. Will order Glucerna shakes for additional kcal and protein.   Per weight records, weights have increased, most likely d/t fluid. Pt weighed 149 lbs on 3/4. Admission weight was 177 lbs. Per I/Os: -3.2L since admit. Per nursing documentation, pt with moderate UE and mild LE edema.   Medications: IV Lasix once  Labs reviewed: CBGs: 189-258 Low Na Elevated Na GFR: 35  NUTRITION - FOCUSED PHYSICAL EXAM:  Unable to perform -working remotely.  Diet Order:   Diet Order            Diet 2 gram sodium Room service appropriate? Yes; Fluid consistency: Thin; Fluid restriction: 1200 mL Fluid  Diet effective now              EDUCATION NEEDS:   Not appropriate for education at this time  Skin:  Skin Assessment: Skin Integrity Issues: Skin Integrity Issues:: DTI DTI: right heel  Last BM:  unknown  Height:   Ht Readings from Last 1 Encounters:  12/19/18 5\' 3"  (1.6 m)    Weight:   Wt Readings from Last 1 Encounters:  12/24/18 86.4 kg    Ideal Body Weight:  52.3 kg  BMI:  Body mass index is 33.74 kg/m.  Estimated Nutritional  Needs:   Kcal:  2094-7096  Protein:  80-90g  Fluid:  1.9L/day   Clayton Bibles, MS, RD, LDN [place] Dietitian Pager: [phone] After Hours Pager: [phone]

## 2018-12-24 NOTE — Evaluation (Signed)
Physical Therapy Evaluation Patient Details Name: Lori Hardy MRN: [id number] DOB: [birth year] Today's Date: 12/24/2018   History of Present Illness  Lori Hardy is a 78 y.o. female with hx of DM2, HTN and cirrhosis brought to ED by family w/ AMS, nausea and abd distension. Became confused at home.  Hx of low Na in the past, admitted for this in Feb 2020.  Clinical Impression  Pt admitted as above and presenting with generalized weakness limiting functional mobility.  Son present and translating for pt, reports she speaks no english. Per son, pt is baseline total care for BADLs, usually uses wheelchair when she does leave the bed but is able to walk up/down 4 stairs with bilat rails and assist to enter/exit home. At this time pt is mod A +2 for bed mobility- deferred standing this date due to pt fatigue. Will continue to follow to return pt to PLOF for family to care at home, otherwise no PT follow up. Pt has HHA 4 hours a day 7x per week at baseline. Will continue to follow while acute per POC listed below    Follow Up Recommendations No PT follow up(If pt can reach PLOF )    Equipment Recommendations  None recommended by PT    Recommendations for Other Services       Precautions / Restrictions Precautions Precautions: Fall Precaution Comments: speaks no english Restrictions Weight Bearing Restrictions: No      Mobility  Bed Mobility Overal bed mobility: Needs Assistance Bed Mobility: Supine to Sit;Sit to Supine     Supine to sit: Mod assist;+2 for physical assistance;+2 for safety/equipment Sit to supine: Mod assist;+2 for physical assistance;+2 for safety/equipment   General bed mobility comments: need significanty encouragement and initation of BLE translation to EOB; use of bed pad to move hips;  On return to bed, pt throwing trunk bkwd unexpectedly  Transfers                 General transfer comment: attempted, pt trying to lay down several times so deferred  this date  Ambulation/Gait                Stairs            Wheelchair Mobility    Modified Rankin (Stroke Patients Only)       Balance Overall balance assessment: Needs assistance Sitting-balance support: Bilateral upper extremity supported;Single extremity supported Sitting balance-Leahy Scale: Poor Sitting balance - Comments: poor safety awareness, will lie down without warning                                     Pertinent Vitals/Pain Pain Assessment: No/denies pain    Home Living Family/patient expects to be discharged to:: Private residence Living Arrangements: Children Available Help at Discharge: Family;Available 24 hours/day Type of Home: House Home Access: Stairs to enter Entrance Stairs-Rails: Can reach both Entrance Stairs-Number of Steps: 4 Home Layout: Multi-level;Able to live on main level with bedroom/bathroom;Full bath on main level;Laundry or work area in [employer]basement Home Equipment: Environmental consultantWalker - 2 wheels;Wheelchair - [employer]manual;Bedside commode;Other (comment) Additional Comments: has aid 4 hours per day x7 days per week    Prior Function Level of Independence: Needs assistance   Gait / Transfers Assistance Needed: RW to wheelchair, son reports pt "never" gets out of bed.  Son also reports pt climbs 4 stairs in/out of house with 2 rails and assist  ADL's /  Homemaking Assistance Needed: baseline total care for dressing, bathing, toileting, etc.        Hand Dominance   Dominant Hand: Right    Extremity/Trunk Assessment   Upper Extremity Assessment Upper Extremity Assessment: Defer to OT evaluation RUE Deficits / Details: roughly 3-/5 LUE Deficits / Details: roughly 3-/5    Lower Extremity Assessment Lower Extremity Assessment: Generalized weakness       Communication   Communication: Prefers language other than English  Cognition Arousal/Alertness: Awake/alert Behavior During Therapy: WFL for tasks  assessed/performed Overall Cognitive Status: Impaired/Different from baseline                                 General Comments: difficult to fully assess given language barrier. Pt appears disoriented to situation, family cont to endorse confusion stating pt not at baseline. Pt scratching OT this date in attempts to communicate      General Comments      Exercises     Assessment/Plan    PT Assessment Patient needs continued PT services  PT Problem List Decreased strength;Decreased range of motion;Decreased activity tolerance;Decreased balance;Decreased mobility;Pain;Decreased knowledge of use of DME;Obesity;Decreased safety awareness       PT Treatment Interventions DME instruction;Gait training;Stair training;Functional mobility training;Therapeutic activities;Therapeutic exercise;Patient/family education    PT Goals (Current goals can be found in the Care Plan section)  Acute Rehab PT Goals Patient Stated Goal: No goals stated PT Goal Formulation: With patient/family Time For Goal Achievement: 01/07/19 Potential to Achieve Goals: Fair    Frequency Min 3X/week   Barriers to discharge        Co-evaluation PT/OT/SLP Co-Evaluation/Treatment: Yes Reason for Co-Treatment: Complexity of the patient's impairments (multi-system involvement) PT goals addressed during session: Mobility/safety with mobility OT goals addressed during session: ADL's and self-care       AM-PAC PT "6 Clicks" Mobility  Outcome Measure Help needed turning from your back to your side while in a flat bed without using bedrails?: A Lot Help needed moving from lying on your back to sitting on the side of a flat bed without using bedrails?: A Lot Help needed moving to and from a bed to a chair (including a wheelchair)?: Total Help needed standing up from a chair using your arms (e.g., wheelchair or bedside chair)?: Total Help needed to walk in hospital room?: Total Help needed climbing 3-5  steps with a railing? : Total 6 Click Score: 8    End of Session   Activity Tolerance: Patient limited by fatigue Patient left: in bed;with call bell/phone within reach;with bed alarm set;with family/visitor present Nurse Communication: Mobility status PT Visit Diagnosis: Muscle weakness (generalized) (M62.81);Difficulty in walking, not elsewhere classified (R26.2)    Time: 4097-3532 PT Time Calculation (min) (ACUTE ONLY): 23 min   Charges:   PT Evaluation $PT Eval Moderate Complexity: 1 Mod          [place] Pager [phone] Office [phone]   Eddie Koc 12/24/2018, 5:21 PM

## 2018-12-24 NOTE — Progress Notes (Addendum)
PROGRESS NOTE  Lori Hardy NTI:[id number] DOB: May 04, 1941 DOA: 12/19/2018 PCP: Theressa Millard, MD (Inactive)  HPI/Recap of past 24 hours:  Lori Hardy is a 78 y.o. female with hx of DM2, HTN and cirrhosis diagnosed in 2017 at [facility], brought to ED by family w/ AMS, nausea and abd distension. Became confused last night around 11 pm.  Hx of low Na in the past, admitted for this in Feb 2020. W/U in ED showed Na+ 114, creat 1.33, K 5.2, Cl 85, CO2 23 and albumin 3.1. eGFR 38.  WBC 11  H10  plt 217. UA was cloudy, largeLE, +nitrite, 100 prot, many bact/ > 50 wbc, 0-5 rbc.  UNa 11 and UOsm 283. CT abd pelvis showed possible proximal colitis, cirrhosis and extensive atherosclerosis, especially of the SMA and its branches; also chronic haziness of the small bowel mesentery likely from portal hypertension or prior mesenteric adenitis; also chronic mesenteric edema likely related to portal hypertension; also diffuse anasarca and NO ascites. Orders were given for 3% saline but repeat Na+ at 4 am was 119 so this was cancelled.  We are asked to see for admission.   Pt was admitted here in Feb 2020 w/ AMS due to E. Faecalis UTI. She had seizure w/ postictal Todd's paresis likely provoked in setting of ^^BS, hypoNa+, UTI/ sepsis and brain atrophy. She was started on Keppra by neurology because EEG showed R hemispheric dysfunction and she would likely have ^'d seizure risk in the future..  IV then po abx for UTI. DM and HTN as well on medications.    Patient seen in ED, poor historian.  Daughter is here , states she has been swelling up for last 2-3 weeks, drinking a lot of water / juice, thirsty.  Has been confused for about 1-2 days.    Pt was last walking good in 2018, she had a fall and since then has been not walking much.  She walked a little in 2019 and now in 2020 she is pretty much WC dependent.  Has has some abd pain the last few days, no diarrhea or N/V, no fevers.  Having  headaches too, every day per the daughter.   Hospital course complicated by uncontrolled hypertension for which hydralazine was started by nephrology.  AKI with intravascular volume depletion despite anasarca.  12/24/18: Patient was seen and examined at her bedside.  Has significant abdominal pain.  Was given IV Dilaudid 0.5 mg once.  Personally reviewed abdominal x-ray done on 12/23/2018 which showed normal pattern of bowel gas.  Unclear etiology of her persistent abdominal pain.  GI consulted to further assess.     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)   Cryptogenic cirrhosis of liver (HCC)   CKD (chronic kidney disease), stage III (HCC)   Encephalopathy   Debility   Diabetes mellitus type 2 in nonobese (HCC)   Benign essential HTN   Hyponatremia   Anasarca   Volume overload   Hyperkalemia   Pyuria   Pressure injury of skin   Acute metabolic encephalopathy likely secondary to severe hyponatremia versus others Presented with sodium level of 114 Sodium 126 on 12/23/2018 from 127  Continue strict fluid intake and sodium restriction less than 2 g/day Mentation appears to be improved  Abdominal distention with intractable pain in the setting of cryptogenic cirrhosis of liver diagnosed in 2017 at [facility] Diagnosed by imaging detected on CT from [facility] on 08/2015. Apparently work-up to confirm diagnosis in process by Dr. Gertie Fey. FibroScan completed 03/22/2016.  Meld-Na score of 7 on 02/2016. Abdominal x-ray done on 12/22/18 and 12/23/18 unrevealing. Optimize pain control GI consulted to further assess.  Dr. Cristina Gong will see in consultation.  Highly appreciated.  Carbapenem resistant E. coli UTI, POA Completed Rocephin day number 5 out of 5 Urine culture repeated to confirm susceptibilities, results are not back as of 12/24/2018. Results back  >> carbapenem resistant Ecoli  Contact precautions ID contacted Dr. Tommy Medal Blood cultures x2 no growth x2 days Continue to monitor closely  Severe acute on chronic hypervolemic hyponatremia likely secondary to cirrhosis Patient is being followed by nephrology and supposed to be on fluid restriction at home per renal at [facility] Per family has been drinking a lot of fluid at home Currently on fluid restriction 1200 cc daily Sodium improving from 124 on 814 20-1 28 on 12/24/2018 Continue fluid restriction 20 mg daily and sodium restriction 2 g daily Continue strict I's and O's and daily weight Management by nephrology  Type 2 diabetes with uncontrolled hyperglycemia, improved CBG improved with increase doses in Lantus and NovoLog. Continue Lantus 7 units daily She is on 14 units daily of Lantus at home Increase sliding scale to moderate, continue insulin sliding scale Hemoglobin A1c 7.3 on 12/21/2018  CKD 3 Baseline creatinine appears to be 1.3 with GFR 46 Creatinine continues to improve. Creatinine from 1.50 to 1.49 on 12/23/2018>> 1.41 on 12/24/2018. Intravascularly volume depleted Started on IV albumin 25 g 3 times daily per nephrology Continue to closely monitor renal function and urine output Continue to avoid nephrotoxins Net I&O -3.2 L since admission  Chronic constipation On aggressive bowel regimen Still no bowel movement as of 12/24/2018 morning Continue bowel regimen with Senokot 2 tablet twice daily, MiraLAX twice daily and prune juice daily GI has been consulted due to severe abdominal distention and intractable abdominal pain  Anasarca secondary to cirrhosis with hypoalbuminemia CT abdomen and pelvis with contrast done on 12/19/18 shows cirrhotic liver with surface lobulation and fissures/caudate lobe enlargement Persistent hypervolemia on exam but appears to be improving  Continue fluid and salt restriction Albumin 2.8 on 12/22/2018  Anemia of chronic disease   Hemoglobin dropped from 9.9 on 12/20/2020 8.4 on 12/24/2018 Obtain iron studies No sign of overt bleeding No bowel movements x5 days  Uncontrolled hypertension, suspect contributed by her pain Optimize pain control Currently on Norvasc 10 mg daily, p.o. hydralazine 25 mg 3 times daily, Lopressor p.o. 25 mg twice daily Continue to closely monitor vital signs  Obesity BMI 32 Recommend weight loss outpatient when more stable with healthy dieting and regular physical activity  Proximal colitis, possible by CT scan Lactic acid and procalcitonin negative on 12/21/2018 Abdominal x-ray done on 12/22/2018 personally reviewed, no acute findings. Repeated abdominal x-ray done on 12/23/2018 reviewed personally showed normal bowel gas pattern.  History of seizures During her last admission in January 2020 she was placed on Keppra but this has been discontinued at neurology office.'  Physical debility/ambulatory dysfunction PT OT to assess Fall precautions    DVT prophylaxis:  Subcu Lovenox daily Family communication:  Family not at bedside this morning.  We will call family. Code status: full Consultants: Nephrology, GI on 12/24/2018.   Disposition: Discharge to home when sodium level is close to baseline and abdominal distention is improved or when nephrology and GI signed off.  Objective: Vitals:   12/24/18 0710 12/24/18 0800 12/24/18 0900 12/24/18 1000  BP:  (!) 174/70 (!) 189/87   Pulse:  92 92 90  Resp:  (!) 21 19 17   Temp: 97.7 F (36.5 C)     TempSrc: Axillary     SpO2:  92% 95% 91%  Weight:      Height:        Intake/Output Summary (Last 24 hours) at 12/24/2018 1116 Last data filed at 12/24/2018 6578 Gross per 24 hour  Intake 440 ml  Output 750 ml  Net -310 ml   Filed Weights   12/22/18 0500 12/23/18 0440 12/24/18 0500  Weight: 84.3 kg 80.4 kg 86.4 kg    Exam:  . General: 78 y.o. year-old female obese appears very uncomfortable due to abdominal pain and  distention.   . Cardiovascular: Regular rate and rhythm no rubs or gallops no JVD or thyromegaly.   Marland Kitchen Respiratory: Clear to auscultation no wheezes or rales.  Poor inspiratory effort.   . Abdomen: Moderately distended and diffusely tender.  Hypoactive bowel sounds. . Musculoskeletal: Dependent edema in all 4 extremities. Marland Kitchen Psychiatry: Mood is appropriate for condition and setting.    Data Reviewed: CBC: Recent Labs  Lab 12/19/18 0247 12/20/18 0222 12/21/18 0612 12/24/18 0738  WBC 11.7* 9.5 8.3 8.5  NEUTROABS 8.0*  --  4.5 5.7  HGB 10.2* 10.5* 9.9* 8.4*  HCT 30.4* 32.2* 31.6* 26.4*  MCV 88.4 90.2 92.1 90.7  PLT 217 190 212 469   Basic Metabolic Panel: Recent Labs  Lab 12/21/18 0211 12/22/18 0215 12/22/18 1640 12/23/18 0148 12/24/18 0530  NA 127* 124* 127* 126* 128*  K 4.4 4.3 4.7 4.4 5.3*  CL 97* 94* 95* 94* 96*  CO2 21* 19* 23 20* 21*  GLUCOSE 103* 208* 216* 234* 145*  BUN 15 18 20 19 19   CREATININE 1.44* 1.53* 1.50* 1.49* 1.41*  CALCIUM 8.1* 7.9* 8.2* 8.2* 8.5*   GFR: Estimated Creatinine Clearance: 33.7 mL/min (A) (by C-G formula based on SCr of 1.41 mg/dL (H)). Liver Function Tests: Recent Labs  Lab 12/19/18 0247 12/22/18 0215  AST 32  --   ALT 30  --   ALKPHOS 95  --   BILITOT 0.7  --   PROT 7.6  --   ALBUMIN 3.1* 2.8*   Recent Labs  Lab 12/21/18 0630  LIPASE 20   Recent Labs  Lab 12/19/18 0247  AMMONIA 13   Coagulation Profile: No results for input(s): INR, PROTIME in the last 168 hours. Cardiac Enzymes: No results for input(s): CKTOTAL, CKMB, CKMBINDEX, TROPONINI in the last 168 hours. BNP (last 3 results) No results for input(s): PROBNP in the last 8760 hours. HbA1C: No results for input(s): HGBA1C in the last 72 hours. CBG: Recent Labs  Lab 12/22/18 2128 12/23/18 0732 12/23/18 1138 12/23/18 2145 12/24/18 0745  GLUCAP 254* 232* 197* 185* 258*   Lipid Profile: No results for input(s): CHOL, HDL, LDLCALC, TRIG, CHOLHDL,  LDLDIRECT in the last 72 hours. Thyroid Function Tests: No results for input(s): TSH, T4TOTAL, FREET4, T3FREE, THYROIDAB in the last 72 hours. Anemia Panel: No results for input(s): VITAMINB12, FOLATE, FERRITIN, TIBC, IRON, RETICCTPCT in the last 72 hours. Urine analysis:    Component Value Date/Time   COLORURINE YELLOW 12/19/2018 0515   APPEARANCEUR CLOUDY (A) 12/19/2018 0515   LABSPEC 1.020 12/19/2018 0515   PHURINE 7.0 12/19/2018 0515   GLUCOSEU 50 (A) 12/19/2018 0515   HGBUR SMALL (A) 12/19/2018 0515   BILIRUBINUR  NEGATIVE 12/19/2018 0515   KETONESUR NEGATIVE 12/19/2018 0515   PROTEINUR 100 (A) 12/19/2018 0515   NITRITE POSITIVE (A) 12/19/2018 0515   LEUKOCYTESUR LARGE (A) 12/19/2018 0515   Sepsis Labs: @LABRCNTIP (procalcitonin:4,lacticidven:4)  ) Recent Results (from the past 240 hour(s))  Urine culture     Status: Abnormal (Preliminary result)   Collection Time: 12/19/18  5:15 AM   Specimen: Urine, Catheterized  Result Value Ref Range Status   Specimen Description   Final    URINE, CATHETERIZED Performed at [facility], [place] [address], [place], [place] [address]    Special Requests   Final    NONE Performed at [facility], [place] [address], [place], [place] [address]    Culture (A)  Final    >=100,000 COLONIES/mL ESCHERICHIA COLI REPEATING TO CONFIRM SUSCEPTIBILITIES Performed at [facility], [place] [address], [place], [place] [address]    Report Status PENDING  Incomplete  SARS CORONAVIRUS 2 Nasal Swab Aptima Multi Swab     Status: None   Collection Time: 12/19/18  5:24 AM   Specimen: Aptima Multi Swab; Nasal Swab  Result Value Ref Range Status   SARS Coronavirus 2 NEGATIVE NEGATIVE Final    Comment: (NOTE) SARS-CoV-2 target nucleic acids are NOT DETECTED. The SARS-CoV-2 RNA is generally detectable in upper and lower respiratory specimens during the acute phase of infection. Negative results do not preclude  SARS-CoV-2 infection, do not rule out co-infections with other pathogens, and should not be used as the sole basis for treatment or other patient management decisions. Negative results must be combined with clinical observations, patient history, and epidemiological information. The expected result is Negative. Fact Sheet for Patients: SugarRoll.be Fact Sheet for Healthcare Providers: https://www.woods-mathews.com/ This test is not yet approved or cleared by the [place] FDA and  has been authorized for detection and/or diagnosis of SARS-CoV-2 by FDA under an Emergency Use Authorization (EUA). This EUA will remain  in effect (meaning this test can be used) for the duration of the COVID-19 declaration under Section 56 4(b)(1) of the Act, 21 U.S.C. section 360bbb-3(b)(1), unless the authorization is terminated or revoked sooner. Performed at [facility], [place] [address] [place], [place] [address]   MRSA PCR Screening     Status: None   Collection Time: 12/19/18 12:29 PM   Specimen: Nasal Mucosa; Nasopharyngeal  Result Value Ref Range Status   MRSA by PCR NEGATIVE NEGATIVE Final    Comment:        The GeneXpert MRSA Assay (FDA approved for NASAL specimens only), is one component of a comprehensive MRSA colonization surveillance program. It is not intended to diagnose MRSA infection nor to guide or monitor treatment for MRSA infections. Performed at [facility], [place] [address] [place], [place] [address]   Culture, blood (routine x 2)     Status: None (Preliminary result)   Collection Time: 12/21/18  6:12 AM   Specimen: BLOOD RIGHT ARM  Result Value Ref Range Status   Specimen Description   Final    BLOOD RIGHT ARM Performed at [place] [address], [place], [place] [address]    Special Requests   Final    BOTTLES DRAWN AEROBIC ONLY Blood Culture adequate volume Performed at  [place] [address] [place],  [address]    Culture   Final    NO GROWTH 3 DAYS Performed at [facility], [place] [address],  [place], [place] [address]    Report Status PENDING  Incomplete  Culture, blood (routine x 2)     Status: None (Preliminary result)   Collection Time: 12/21/18  6:13 AM   Specimen: BLOOD RIGHT HAND  Result Value Ref Range Status   Specimen Description   Final    BLOOD RIGHT HAND Performed at [place] [address] [place], [place] [address]    Special Requests   Final    BOTTLES DRAWN AEROBIC ONLY Blood Culture adequate volume Performed at [place] [address], [place], [place] [address]    Culture   Final    NO GROWTH 3 DAYS Performed at [facility], [place] [address], [place], [place] [address]    Report Status PENDING  Incomplete      Studies: No results found.  Scheduled Meds: . amLODipine  10 mg Oral Daily  . Chlorhexidine Gluconate Cloth  6 each Topical Daily  . enoxaparin (LOVENOX) injection  40 mg Subcutaneous Q24H  . hydrALAZINE  25 mg Oral Q8H  . insulin aspart  0-15 Units Subcutaneous TID WC  . insulin aspart  0-5 Units Subcutaneous QHS  . insulin glargine  7 Units Subcutaneous Daily  . latanoprost  1 drop Both Eyes QHS  . levothyroxine  25 mcg Oral QAC breakfast  . LORazepam  0.5 mg Intravenous Once  . metoprolol tartrate  25 mg Oral BID  . pantoprazole  40 mg Oral Daily  . polyethylene glycol  17 g Oral BID  . rosuvastatin  10 mg Oral QHS  . senna-docusate  2 tablet Oral BID  . simethicone  80 mg Oral TID  . sodium chloride flush  3 mL Intravenous Q12H    Continuous Infusions: . sodium chloride 10 mL/hr at 12/22/18 1127  . albumin human 25 g (12/24/18 0953)  . cefTRIAXone (ROCEPHIN)  IV Stopped (12/23/18 1222)     LOS: 5 days     Kayleen Memos, MD [facility] Pager [phone]  If 7PM-7AM, please contact  night-coverage [email or web address] Password [facility] 12/24/2018, 11:16 AM

## 2018-12-25 ENCOUNTER — Inpatient Hospital Stay (HOSPITAL_COMMUNITY): Payer: Medicare Other

## 2018-12-25 DIAGNOSIS — G9340 Encephalopathy, unspecified: Secondary | ICD-10-CM

## 2018-12-25 DIAGNOSIS — E871 Hypo-osmolality and hyponatremia: Secondary | ICD-10-CM | POA: Diagnosis not present

## 2018-12-25 DIAGNOSIS — N183 Chronic kidney disease, stage 3 (moderate): Secondary | ICD-10-CM | POA: Diagnosis not present

## 2018-12-25 DIAGNOSIS — J9601 Acute respiratory failure with hypoxia: Secondary | ICD-10-CM

## 2018-12-25 DIAGNOSIS — K7469 Other cirrhosis of liver: Secondary | ICD-10-CM | POA: Diagnosis not present

## 2018-12-25 DIAGNOSIS — I10 Essential (primary) hypertension: Secondary | ICD-10-CM | POA: Diagnosis not present

## 2018-12-25 DIAGNOSIS — G9341 Metabolic encephalopathy: Secondary | ICD-10-CM | POA: Diagnosis not present

## 2018-12-25 DIAGNOSIS — E119 Type 2 diabetes mellitus without complications: Secondary | ICD-10-CM | POA: Diagnosis not present

## 2018-12-25 DIAGNOSIS — R600 Localized edema: Secondary | ICD-10-CM

## 2018-12-25 LAB — BASIC METABOLIC PANEL
Anion gap: 12 (ref 5–15)
BUN: 21 mg/dL (ref 8–23)
CO2: 20 mmol/L — ABNORMAL LOW (ref 22–32)
Calcium: 8.8 mg/dL — ABNORMAL LOW (ref 8.9–10.3)
Chloride: 94 mmol/L — ABNORMAL LOW (ref 98–111)
Creatinine, Ser: 1.87 mg/dL — ABNORMAL HIGH (ref 0.44–1.00)
GFR calc Af Amer: 29 mL/min — ABNORMAL LOW (ref >60)
GFR calc non Af Amer: 25 mL/min — ABNORMAL LOW (ref >60)
Glucose, Bld: 220 mg/dL — ABNORMAL HIGH (ref 70–99)
Potassium: 4 mmol/L (ref 3.5–5.1)
Sodium: 126 mmol/L — ABNORMAL LOW (ref 135–145)

## 2018-12-25 LAB — FOLATE RBC
Folate, Hemolysate: 339 ng/mL
Folate, RBC: 1319 ng/mL (ref >498)
Hematocrit: 25.7 % — ABNORMAL LOW (ref 34.0–46.6)

## 2018-12-25 LAB — CBC
HCT: 25.7 % — ABNORMAL LOW (ref 36.0–46.0)
Hemoglobin: 8 g/dL — ABNORMAL LOW (ref 12.0–15.0)
MCH: 29.2 pg (ref 26.0–34.0)
MCHC: 31.1 g/dL (ref 30.0–36.0)
MCV: 93.8 fL (ref 80.0–100.0)
Platelets: 171 10*3/uL (ref 150–400)
RBC: 2.74 MIL/uL — ABNORMAL LOW (ref 3.87–5.11)
RDW: 15.1 % (ref 11.5–15.5)
WBC: 14.7 10*3/uL — ABNORMAL HIGH (ref 4.0–10.5)
nRBC: 0 % (ref 0.0–0.2)

## 2018-12-25 LAB — GLUCOSE, CAPILLARY
Glucose-Capillary: 260 mg/dL — ABNORMAL HIGH (ref 70–99)
Glucose-Capillary: 270 mg/dL — ABNORMAL HIGH (ref 70–99)
Glucose-Capillary: 311 mg/dL — ABNORMAL HIGH (ref 70–99)
Glucose-Capillary: 250 mg/dL — ABNORMAL HIGH (ref 70–99)
Glucose-Capillary: 297 mg/dL — ABNORMAL HIGH (ref 70–99)

## 2018-12-25 LAB — ALBUMIN: Albumin: 4.9 g/dL (ref 3.5–5.0)

## 2018-12-25 LAB — BRAIN NATRIURETIC PEPTIDE: B Natriuretic Peptide: 1185.2 pg/mL — ABNORMAL HIGH (ref 0.0–100.0)

## 2018-12-25 MED ORDER — DIAZEPAM 5 MG/ML IJ SOLN
2.5000 mg | Freq: Once | INTRAMUSCULAR | Status: AC
  Administered 2018-12-25: 11:00:00 2.5 mg via INTRAVENOUS
  Filled 2018-12-25: qty 2

## 2018-12-25 MED ORDER — FUROSEMIDE 10 MG/ML IJ SOLN
20.0000 mg | Freq: Once | INTRAMUSCULAR | Status: AC
  Administered 2018-12-25: 21:00:00 20 mg via INTRAVENOUS
  Filled 2018-12-25: qty 2

## 2018-12-25 MED ORDER — ENOXAPARIN SODIUM 30 MG/0.3ML ~~LOC~~ SOLN
30.0000 mg | SUBCUTANEOUS | Status: DC
  Administered 2018-12-25 – 2018-12-26 (×2): 30 mg via SUBCUTANEOUS
  Filled 2018-12-25 (×2): qty 0.3

## 2018-12-25 MED ORDER — SODIUM CHLORIDE 0.9 % IV SOLN
1.5000 g | Freq: Two times a day (BID) | INTRAVENOUS | Status: DC
  Administered 2018-12-25 – 2018-12-28 (×6): 1.5 g via INTRAVENOUS
  Filled 2018-12-25: qty 1.5
  Filled 2018-12-25: qty 4
  Filled 2018-12-25: qty 1.5
  Filled 2018-12-25 (×2): qty 4
  Filled 2018-12-25 (×2): qty 1.5

## 2018-12-25 MED ORDER — HYDRALAZINE HCL 20 MG/ML IJ SOLN
10.0000 mg | Freq: Four times a day (QID) | INTRAMUSCULAR | Status: DC | PRN
  Administered 2018-12-25: 10:00:00 10 mg via INTRAVENOUS
  Filled 2018-12-25: qty 1

## 2018-12-25 MED ORDER — LACTULOSE 10 GM/15ML PO SOLN
20.0000 g | Freq: Every day | ORAL | Status: DC | PRN
  Administered 2018-12-25: 20 g via ORAL
  Filled 2018-12-25: qty 30

## 2018-12-25 NOTE — Progress Notes (Addendum)
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 Patient was noted to have hyponatremia with sodium 114 on presentation.  She was also noted to have leg edema and abdominal distention hence started on IV diuretics for presumed anasarca.  However CT abdomen did not show any evidence of ascites.  Patient did not show any evidence of pulmonary edema.  Her albumin level on admission labs was 3.1.  She has been receiving IV albumin every 8 hours since admission. Patient has remained confused during the hospital course with intermittent retching and complaints of abdominal pain.  She was seen by GI and noted to have hard stool/possible impaction and was disimpacted.  She has had several bowel movements since-soft stools per nurse.  No melena or hematochezia.  CT abdomen on admission did raise the possibility of proximal colitis and she has history of SMA atherosclerosis which raises the concern for possible intestinal/mesenteric ischemia and GI contemplating on possible colonoscopy. Patient has also been treated with Rocephin/Flagyl during the hospital course.  Of note, patient had similar presentation in February with altered mental status and hyponatremia with sodium 124 at which time she was extensively evaluated by neurology.  She was treated for UTI/metabolic encephalopathy and subsequently discharged to rehab.  In that admission patient also received Keppra for possible seizure prophylaxis.  Her MRI showed  remote lacunar infarct and no new strokes.  At the time of discharge it appears that she was able to ambulate short distances with a walker and was arranged for home health services.  In the current admission, her urine cultures grew Carbapenem resistant E.coli and patient seen by ID, received 1 dose of fosfomycin on 8/16.   Subjective: She continues to moan intermittently with abdominal pain and appears disoriented (was able to name her son and 2 daughters as Georgiana Shorearendra, Bhavna and Bradford Woodshetna but stated she did not know the year and was unable to recognize this place. She thinks she is in a Motel). She appears to be retching intermittently/spitting saliva.  On my interview, it is noted that she is able to speak good Hindi.  According to prior discharge summary patient was also able to speak English previously. Patient asking for Jell-O repeatedly during interview but moans with abdominal pain.  It appears that she does not complain of abdominal pain when distracted.  Objective: Vitals:   12/25/18 0400 12/25/18 0500 12/25/18 0600 12/25/18 0800  BP: (!) 187/66 (!) 157/70 (!) 194/62   Pulse: 70 68 71   Resp: 16 (!) 26 (!) 21   Temp: 99.6 F (37.6 C)   99.9 F (37.7 C)  TempSrc: Axillary   Axillary  SpO2: 92% (!) 86% (!) 88%   Weight:  81.2 kg    Height:        Intake/Output Summary (Last 24 hours) at 12/25/2018 0829 Last data filed at 12/25/2018 0600 Gross per 24 hour  Intake 906.41 ml  Output 250 ml  Net 656.41 ml   Filed Weights   12/23/18 0440 12/24/18 0500 12/25/18 0500  Weight: 80.4 kg 86.4  kg 81.2 kg    Physical Examination:  General exam: Appears calm and comfortable  Respiratory system: Clear to auscultation. Respiratory effort normal. Cardiovascular system: S1 & S2 heard, RRR. No JVD, murmurs, rubs, gallops or clicks.  Trace leg/pedal edema. Gastrointestinal system: Abdomen is nondistended, soft and nontender. No organomegaly or masses felt. Normal bowel sounds heard. Central  nervous system: Alert and oriented. No focal neurological deficits. Extremities: Symmetric 5 x 5 power. Skin: No rashes, lesions or ulcers Psychiatry: Judgement and insight appear impaired.  Mood & affect anxious    Data Reviewed: I have personally reviewed following labs and imaging studies  CBC: Recent Labs  Lab 12/19/18 0247 12/20/18 0222 12/21/18 0612 12/24/18 0738  WBC 11.7* 9.5 8.3 8.5  NEUTROABS 8.0*  --  4.5 5.7  HGB 10.2* 10.5* 9.9* 8.4*  HCT 30.4* 32.2* 31.6* 26.4*  MCV 88.4 90.2 92.1 90.7  PLT 217 190 212 158   Basic Metabolic Panel: Recent Labs  Lab 12/22/18 0215 12/22/18 1640 12/23/18 0148 12/24/18 0530 12/25/18 0216  NA 124* 127* 126* 128* 126*  K 4.3 4.7 4.4 5.3* 4.0  CL 94* 95* 94* 96* 94*  CO2 19* 23 20* 21* 20*  GLUCOSE 208* 216* 234* 145* 220*  BUN 18 20 19 19 21   CREATININE 1.53* 1.50* 1.49* 1.41* 1.87*  CALCIUM 7.9* 8.2* 8.2* 8.5* 8.8*   GFR: Estimated Creatinine Clearance: 24.6 mL/min (A) (by C-G formula based on SCr of 1.87 mg/dL (H)). Liver Function Tests: Recent Labs  Lab 12/19/18 0247 12/22/18 0215 12/25/18 0216  AST 32  --   --   ALT 30  --   --   ALKPHOS 95  --   --   BILITOT 0.7  --   --   PROT 7.6  --   --   ALBUMIN 3.1* 2.8* 4.9   Recent Labs  Lab 12/21/18 0630  LIPASE 20   Recent Labs  Lab 12/19/18 0247  AMMONIA 13   Coagulation Profile: No results for input(s): INR, PROTIME in the last 168 hours. Cardiac Enzymes: No results for input(s): CKTOTAL, CKMB, CKMBINDEX, TROPONINI in the last 168 hours. BNP (last 3 results) No results for input(s): PROBNP in the last 8760 hours. HbA1C: No results for input(s): HGBA1C in the last 72 hours. CBG: Recent Labs  Lab 12/24/18 0745 12/24/18 1118 12/24/18 1557 12/24/18 2207 12/25/18 0755  GLUCAP 258* 189* 193* 298* 260*   Lipid Profile: No results for input(s): CHOL, HDL, LDLCALC, TRIG, CHOLHDL, LDLDIRECT in the last 72 hours. Thyroid Function Tests: No results for  input(s): TSH, T4TOTAL, FREET4, T3FREE, THYROIDAB in the last 72 hours. Anemia Panel: Recent Labs    12/24/18 0737 12/24/18 0738  VITAMINB12 354  --   FERRITIN 55  --   TIBC 222*  --   IRON 32  --   RETICCTPCT  --  1.7   Sepsis Labs: Recent Labs  Lab 12/19/18 0247 12/21/18 0612  PROCALCITON  --  <0.10  LATICACIDVEN 1.1 0.8    Recent Results (from the past 240 hour(s))  Urine culture     Status: Abnormal   Collection Time: 12/19/18  5:15 AM   Specimen: Urine, Catheterized  Result Value Ref Range Status   Specimen Description   Final    URINE, CATHETERIZED Performed at [facility], [address] [place], [place] [address]    Special Requests   Final    NONE Performed at [facility], [address]  [place], [place] [address]    Culture (A)  Final    >=100,000 COLONIES/mL ESCHERICHIA COLI Confirmed Extended Spectrum Beta-Lactamase Producer (ESBL).  In bloodstream infections from ESBL organisms, carbapenems are preferred over piperacillin/tazobactam. They are shown to have a lower risk of mortality. CONFIRMED CARBAPENEMASE RESISTANT ENTEROBACTERIACAE    Report Status 12/24/2018 FINAL  Final   Organism ID, Bacteria ESCHERICHIA COLI (A)  Final      Susceptibility   Escherichia coli - MIC*    AMPICILLIN >=32 RESISTANT Resistant     CEFAZOLIN >=64 RESISTANT Resistant     CEFTRIAXONE >=64 RESISTANT Resistant     CIPROFLOXACIN >=4 RESISTANT Resistant     GENTAMICIN >=16 RESISTANT Resistant     IMIPENEM >=16 RESISTANT Resistant     NITROFURANTOIN 32 SENSITIVE Sensitive     TRIMETH/SULFA >=320 RESISTANT Resistant     AMPICILLIN/SULBACTAM >=32 RESISTANT Resistant     PIP/TAZO >=128 RESISTANT Resistant     Extended ESBL POSITIVE Resistant     * >=100,000 COLONIES/mL ESCHERICHIA COLI  Carbapenem Resistance Panel     Status: Abnormal   Collection Time: 12/19/18  5:20 AM  Result Value Ref Range Status   Carba Resistance IMP Gene NOT  DETECTED NOT DETECTED Final   Carba Resistance VIM Gene NOT DETECTED NOT DETECTED Final   Carba Resistance NDM Gene DETECTED (A) NOT DETECTED Final    Comment: CRITICAL RESULT CALLED TO, READ BACK BY AND VERIFIED WITH: RN ZOE SUGGS 1644 [id number] FCP    Carba Resistance KPC Gene NOT DETECTED NOT DETECTED Final   Carba Resistance OXA48 Gene NOT DETECTED NOT DETECTED Final    Comment: (NOTE) Cepheid Carba-R is an FDA-cleared nucleic acid amplification test  (NAAT)for the detection and differentiation of genes encoding the  most prevalent carbapenemases in bacterial isolate samples. Carbapenemase gene identification and implementation of comprehensive  infection control measures are recommended by the CDC to prevent the  spread of the resistant organisms. Performed at [facility], [address] [place], [place] [address]   SARS CORONAVIRUS 2 Nasal Swab Aptima Multi Swab     Status: None   Collection Time: 12/19/18  5:24 AM   Specimen: Aptima Multi Swab; Nasal Swab  Result Value Ref Range Status   SARS Coronavirus 2 NEGATIVE NEGATIVE Final    Comment: (NOTE) SARS-CoV-2 target nucleic acids are NOT DETECTED. The SARS-CoV-2 RNA is generally detectable in upper and lower respiratory specimens during the acute phase of infection. Negative results do not preclude SARS-CoV-2 infection, do not rule out co-infections with other pathogens, and should not be used as the sole basis for treatment or other patient management decisions. Negative results must be combined with clinical observations, patient history, and epidemiological information. The expected result is Negative. Fact Sheet for Patients: HairSlick.nohttps://www.fda.gov/media/138098/download Fact Sheet for Healthcare Providers: quierodirigir.comhttps://www.fda.gov/media/138095/download This test is not yet approved or cleared by the [place] FDA and  has been authorized for detection and/or diagnosis of SARS-CoV-2 by FDA under an Emergency Use  Authorization (EUA). This EUA will remain  in effect (meaning this test can be used) for the duration of the COVID-19 declaration under Section 56 4(b)(1) of the Act, 21 U.S.C. section 360bbb-3(b)(1), unless the authorization is terminated or revoked sooner. Performed at [facility], [address] [place], [place] [address]   MRSA PCR Screening     Status: None   Collection Time: 12/19/18 12:29 PM   Specimen: Nasal Mucosa; Nasopharyngeal  Result Value Ref Range Status  MRSA by PCR NEGATIVE NEGATIVE Final    Comment:        The GeneXpert MRSA Assay (FDA approved for NASAL specimens only), is one component of a comprehensive MRSA colonization surveillance program. It is not intended to diagnose MRSA infection nor to guide or monitor treatment for MRSA infections. Performed at [facility], [address] [place], [place] [address]   Culture, blood (routine x 2)     Status: None (Preliminary result)   Collection Time: 12/21/18  6:12 AM   Specimen: BLOOD RIGHT ARM  Result Value Ref Range Status   Specimen Description   Final    BLOOD RIGHT ARM Performed at [facility], [address] [place], [place] [address]    Special Requests   Final    BOTTLES DRAWN AEROBIC ONLY Blood Culture adequate volume Performed at [facility], [address] [place], [place] [address]    Culture   Final    NO GROWTH 4 DAYS Performed at [facility], [address] [place], [place] [address]    Report Status PENDING  Incomplete  Culture, blood (routine x 2)     Status: None (Preliminary result)   Collection Time: 12/21/18  6:13 AM   Specimen: BLOOD RIGHT HAND  Result Value Ref Range Status   Specimen Description   Final    BLOOD RIGHT HAND Performed at [facility], [address] [place], [place] [address]    Special Requests   Final    BOTTLES DRAWN AEROBIC ONLY Blood Culture adequate  volume Performed at [facility], [address] [place], [place] [address]    Culture   Final    NO GROWTH 4 DAYS Performed at [facility], [address] [place], [place] [address]    Report Status PENDING  Incomplete      Radiology Studies: No results found.      Scheduled Meds:  amLODipine  10 mg Oral Daily   Chlorhexidine Gluconate Cloth  6 each Topical Daily   enoxaparin (LOVENOX) injection  30 mg Subcutaneous Q24H   feeding supplement (GLUCERNA SHAKE)  237 mL Oral TID BM   hydrALAZINE  50 mg Oral Q8H   insulin aspart  0-15 Units Subcutaneous TID WC   insulin aspart  0-5 Units Subcutaneous QHS   insulin glargine  7 Units Subcutaneous Daily   latanoprost  1 drop Both Eyes QHS   levothyroxine  25 mcg Oral QAC breakfast   LORazepam  0.5 mg Intravenous Once   metoprolol tartrate  25 mg Oral BID   multivitamin with minerals  1 tablet Oral Daily   pantoprazole  40 mg Oral Daily   polyethylene glycol  17 g Oral BID   rosuvastatin  10 mg Oral QHS   senna-docusate  2 tablet Oral BID   simethicone  80 mg Oral TID   sodium chloride flush  3 mL Intravenous Q12H   Continuous Infusions:  sodium chloride 10 mL/hr at 12/22/18 1127    Assessment & Plan:    1.  Altered mental status/metabolic encephalopathy: Secondary to hyponatremia versus UTI versus cognitive decline.  Sodium level appears to be improved and now fluctuating between 126-128 which is likely her baseline.  Of note, patient sodium level was at 128 in November 2018 as well.  Will obtain MRI to rule out myelinolysis or new stroke.  If negative, may benefit from detailed neuropsychology/cognitive evaluation as outpatient. I  suspect cognitive dysfunction as MRI in 2/20 showed eneralized cerebral cortical volume loss with advanced signal changes in the white matter, deep gray matter and pons most commonly due to chronic small vessel disease.   2.  Hyponatremia: Appears to  be chronic.  Her sodium levels per record in November 2018 was 128, she presented in February 2020 with sodium of 124 which corrected to 136 at the time of discharge.  Now she presented with sodium of 114 and improved to 128.  She has been receiving IV diuretics for volume overload/leg edema on a daily basis (20 mg IV on 8/12 and 8/14, 40 mg IV on 8/15 and 8/16).  3.  CKD stage II: Patient's baseline creatinine is around 1 -1.2.  It is now increased to 1.87 in the setting of IV diuretics.Nurse reports decreased urine output.  Her creatinine is increased to 1.8 and sodium down to 126 today.  Will hold diuretics and give mild hydration over 5 hours if PVR does not show urinary retention and if chest x-ray unremarkable.  4.  Leg edema: Secondary to hypoalbuminemia versus acute diastolic CHF in the setting of uncontrolled blood pressure.  Although her record mentions anasarca on presentation, with no evidence of ascites or pulmonary edema on admission studies.  Currently only has trace edema and looks dry.  Hold diuretics/IV albumin.  Echo in this admission showed preserved EF, mild diastolic impairment  5.  Mild hypoxic acute respiratory failure: Patient noted to be hypoxic overnight with O2 sats in the high 80s.  She was started on 2 L O2 via nasal cannula.  Currently saturating 92% on 1 L.  Will obtain chest x-ray to rule out pulmonary edema/aspiration/infiltrates.  Patient currently off all antibiotics.  May have underlying sleep apnea  6. Hypertension: Her blood pressure has fluctuated between systolic 229 to  798X during the hospital course.  Continue Norvasc, hydralazine, metoprolol.  Will add IV hydralazine for PRN use.  7.  Diabetes mellitus: Blood glucose elevated currently at 290s.  Likely due to variable diet.  She is continuously asking for Jell-O.  Will add pre-meal insulin.  8.  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  Ammonia level at 11 on  admission.  Albumin at 3.1 and now improved to 4.5.  DC albumin infusions as she is not hypotensive.  Hold diuretics for reasons mentioned above.  Would prefer using lactulose for constipation as might improve mental status in the setting of liver disease.  9.  Acute on chronic anemia: AOCD versus dilutional.  Labs show normal iron level, ferritin at 55, normal saturation and low TIBC.  Fecal occult blood test negative.  Defer need for colonoscopy to GI  10. Constipation: On MiraLAX/Senokot.  Added lactulose as needed. On opiates for pain. D/C IV dilaudid.   DVT prophylaxis: On renal dose Lovenox Code Status: Full code Family / Patient Communication: Discussed with patient in [place].  Will await family members arrival and will discuss with them regarding baseline mental status as unclear timing of cognitive decline Disposition Plan: Pending discussions with family.  Previously evaluated at rehab and arranged home health services     LOS: 6 days    Time spent: 65 minutes    Guilford Shi, MD [facility] Pager [phone]  If 7PM-7AM, please contact night-coverage [email or web address] Password [facility] 12/25/2018, 8:29 AM

## 2018-12-25 NOTE — Progress Notes (Signed)
[facility] Progress Note   Subjective/Interval events:    Afebrile, SBPs high normal, creat up 1.8 today 1st time rising. Wt's up and I/O +650 cc yest, UOP 250 cc recorded ([facility]ure[facility]).     Objective Vitals:   12/25/18 1000 12/25/18 1200 12/25/18 1214 12/25/18 1317  BP: (!) 186/77  (!) 164/59 (!) 144/60  Pulse: 76  67 90  Resp: (!) 23  (!) 24 18  Temp:  100.1 F (37.8 C)  98.9 F (37.2 C)  TempSrc:  Axillary  Oral  SpO2: 93%  92% 97%  Weight:    84.3 kg  Height:    5\' 3"  (1.6 m)   Physical Exam General: awake, alert, lying at 45degrees in no distress  Heart: RRR, no rub Lungs: clear ant Abdomen: soft, mod distended, nontender Extremities: bilat dependent hip/ thigh edema and UE edema 2+, no pretib edema Neuro: debilitated, can raise arms for a few moments though, does not move legs much  Additional Objective Labs: Basic Metabolic Panel: Recent Labs  Lab 12/23/18 0148 12/24/18 0530 12/25/18 0216  NA 126* 128* 126*  K 4.4 5.3* 4.0  CL 94* 96* 94*  CO2 20* 21* 20*  GLUCOSE 234* 145* 220*  BUN 19 19 21   CREATININE 1.49* 1.41* 1.87*  CALCIUM 8.2* 8.5* 8.8*   Liver Function Tests: Recent Labs  Lab 12/19/18 0247 12/22/18 0215 12/25/18 0216  AST 32  --   --   ALT 30  --   --   ALKPHOS 95  --   --   BILITOT 0.7  --   --   PROT 7.6  --   --   ALBUMIN 3.1* 2.8* 4.9   Recent Labs  Lab 12/21/18 0630  LIPASE 20   CBC: Recent Labs  Lab 12/19/18 0247 12/20/18 0222 12/21/18 0612 12/24/18 0738  WBC 11.7* 9.5 8.3 8.5  NEUTROABS 8.0*  --  4.5 5.7  HGB 10.2* 10.5* 9.9* 8.4*  HCT 30.4* 32.2* 31.6* 26.4*  MCV 88.4 90.2 92.1 90.7  PLT 217 190 212 158   Blood Culture    Component Value Date/Time   SDES  12/21/2018 [id number]    BLOOD RIGHT HAND Performed at [facility], [address] [place], [place] [address]    SPECREQUEST  12/21/2018 [phone]    BOTTLES DRAWN AEROBIC ONLY Blood Culture adequate volume Performed at [facility], [address] [place], [place] [address]    CULT  12/21/2018 [phone]    NO GROWTH 4 DAYS Performed at [facility], [address] [place], [place] [address]    REPTSTATUS PENDING 12/21/2018 [id number]    Cardiac Enzymes: No results for input(s): CKTOTAL, CKMB, CKMBINDEX, TROPONINI in the last 168 hours. CBG: Recent Labs  Lab 12/24/18 1557 12/24/18 2207 12/25/18 0755 12/25/18 1226 12/25/18 1325  GLUCAP 193* 298* 260* 270* 311*   Iron Studies:  Recent Labs    12/24/18 0737  IRON 32  TIBC 222*  FERRITIN 55   @lablastinr3 @ Studies/Results: Mr Brain Wo Contrast  Result Date: 12/25/2018 CLINICAL DATA:  Encephalopathy. EXAM: MRI HEAD WITHOUT CONTRAST TECHNIQUE: Multiplanar, multiecho pulse sequences of the brain and surrounding structures were obtained without intravenous contrast. COMPARISON:  Head CT 12/19/2018, CT angiogram head/neck 06/18/2018, brain MRI 06/18/2018 FINDINGS: Brain: Please note the patient did not tolerate the full examination. The acquired axial T1 weighted imaging is severely motion degraded. A coronal T2 weighted sequence could not be acquired. No evidence of acute  infarct. No evidence of intracranial mass. No chronic intracranial hemorrhage. No midline shift or extra-axial collection. Moderate generalized cerebral atrophy. Advanced scattered and confluent T2/FLAIR hyperintensity within the cerebral white matter, bilateral basal ganglia and brainstem is nonspecific, but consistent with chronic small vessel ischemic disease and similar to prior MRI 06/18/2018. Vascular: Flow voids maintained within the proximal large vessels. Skull and upper cervical spine: Normal marrow signal. Sinuses/Orbits: Imaged globes and orbits demonstrate no acute abnormality. Mild scattered paranasal sinus mucosal thickening. Bilateral mastoid effusions (greater on the left). IMPRESSION: Motion degraded and prematurely terminated examination as described. No  evidence of acute intracranial abnormality. Moderate generalized parenchymal atrophy. Advanced signal changes within the cerebral white matter, bilateral basal ganglia and brainstem which are non-specific but most commonly seen in the setting of chronic small vessel ischemic disease. Findings are similar to prior MRI 06/18/2018. Bilateral mastoid effusions. Electronically Signed   By: Kellie Simmering   On: 12/25/2018 12:12   Dg Chest Port 1 View  Result Date: 12/25/2018 CLINICAL DATA:  Increased shortness of breath today. EXAM: PORTABLE CHEST 1 VIEW COMPARISON:  Chest x-rays dated 12/19/2018 and 06/18/2018. FINDINGS: New bibasilar opacities, RIGHT greater than LEFT. Increased interstitial prominence bilaterally, presumably interstitial edema. Stable cardiomegaly. No pneumothorax seen. IMPRESSION: 1. Cardiomegaly with bilateral interstitial prominence, presumably edema, indicating CHF/volume overload. 2. New opacity at the RIGHT lung base. This could represent pneumonia, atelectasis or asymmetric pulmonary edema. Also suspect a RIGHT pleural effusion. 3. New opacity at the LEFT lung base, likely atelectasis and/or small pleural effusion. Electronically Signed   By: Franki Cabot M.D.   On: 12/25/2018 10:44   Medications: . sodium chloride 10 mL/hr at 12/22/18 1127   . amLODipine  10 mg Oral Daily  . Chlorhexidine Gluconate Cloth  6 each Topical Daily  . enoxaparin (LOVENOX) injection  30 mg Subcutaneous Q24H  . feeding supplement (GLUCERNA SHAKE)  237 mL Oral TID BM  . hydrALAZINE  50 mg Oral Q8H  . insulin aspart  0-15 Units Subcutaneous TID WC  . insulin aspart  0-5 Units Subcutaneous QHS  . insulin glargine  7 Units Subcutaneous Daily  . latanoprost  1 drop Both Eyes QHS  . levothyroxine  25 mcg Oral QAC breakfast  . metoprolol tartrate  25 mg Oral BID  . multivitamin with minerals  1 tablet Oral Daily  . pantoprazole  40 mg Oral Daily  . polyethylene glycol  17 g Oral BID  . rosuvastatin  10  mg Oral QHS  . senna-docusate  2 tablet Oral BID  . sodium chloride flush  3 mL Intravenous Q12H    Assessment/Plan: Hyponatremia:  Severe with acute on chronic w/ hypervolemia due to cirrhosis. ECHO showed normal LVEF.  Na 114 on admit improved to 126- 129 range w/ IV lasix (~40 mg/d) +albumin, and fluid restrict 1200 cc. Creat up today 1st time, agree w/ dc of lasix and albumin due to intravasc vol depletion. Overall these problems (HTN, anasarca/ renal failure/ cirrhosis) may prove to be difficult to manage. Have d/w primary team, no new suggestions, will sign off, will be available as needed.   Vol overload/ anasarca: suspected due to cirrhosis. Alb 3.1 on admit, up to 4.9 today, prob due to intravasc vol depletion from diuresis.   Abd pain:  Initial CT with possible colitis, no ascites.  Serial abd x rays unrevealing.  GI consulting.    E coli UTI: +ESBL+, on IV abx, per primary  HTN: on metop/ hydralazine/ amlodipine  CKD3:  baseline creat 1.1-1.3  H/o seizure d/o:  Had seizure in similar setting, was started on keppra but d/c'd at neurology f/u.  Correcting underlying issues that may have promoted seizure in past.   Debility: per discussions w/ family [facility].   Vinson Moselleob Margo Lama, MD 12/25/2018, 2:59 PM

## 2018-12-25 NOTE — Progress Notes (Signed)
Pharmacy Renal Dose Adjustment:  Enoxaparin 40 mg subQ daily has been changed to enoxaparin 30 mg subQ daily for CrCl < 30 mL/min.  Lenis Noon, PharmD 12/25/18 3:30 AM

## 2018-12-25 NOTE — Progress Notes (Signed)
Bladder scanner showing 0cc's in bladder.

## 2018-12-26 DIAGNOSIS — K7469 Other cirrhosis of liver: Secondary | ICD-10-CM | POA: Diagnosis not present

## 2018-12-26 DIAGNOSIS — J9601 Acute respiratory failure with hypoxia: Secondary | ICD-10-CM | POA: Diagnosis not present

## 2018-12-26 DIAGNOSIS — E119 Type 2 diabetes mellitus without complications: Secondary | ICD-10-CM | POA: Diagnosis not present

## 2018-12-26 DIAGNOSIS — Z20828 Contact with and (suspected) exposure to other viral communicable diseases: Secondary | ICD-10-CM | POA: Diagnosis not present

## 2018-12-26 DIAGNOSIS — N183 Chronic kidney disease, stage 3 (moderate): Secondary | ICD-10-CM | POA: Diagnosis not present

## 2018-12-26 DIAGNOSIS — G9341 Metabolic encephalopathy: Secondary | ICD-10-CM | POA: Diagnosis not present

## 2018-12-26 DIAGNOSIS — I10 Essential (primary) hypertension: Secondary | ICD-10-CM | POA: Diagnosis not present

## 2018-12-26 DIAGNOSIS — E871 Hypo-osmolality and hyponatremia: Secondary | ICD-10-CM | POA: Diagnosis not present

## 2018-12-26 LAB — GLUCOSE, CAPILLARY
Glucose-Capillary: 188 mg/dL — ABNORMAL HIGH (ref 70–99)
Glucose-Capillary: 195 mg/dL — ABNORMAL HIGH (ref 70–99)
Glucose-Capillary: 206 mg/dL — ABNORMAL HIGH (ref 70–99)
Glucose-Capillary: 198 mg/dL — ABNORMAL HIGH (ref 70–99)
Glucose-Capillary: 168 mg/dL — ABNORMAL HIGH (ref 70–99)

## 2018-12-26 LAB — CULTURE, BLOOD (ROUTINE X 2)
Culture: NO GROWTH
Culture: NO GROWTH
Special Requests: ADEQUATE
Special Requests: ADEQUATE

## 2018-12-26 LAB — BASIC METABOLIC PANEL
Anion gap: 13 (ref 5–15)
BUN: 29 mg/dL — ABNORMAL HIGH (ref 8–23)
CO2: 20 mmol/L — ABNORMAL LOW (ref 22–32)
Calcium: 9 mg/dL (ref 8.9–10.3)
Chloride: 94 mmol/L — ABNORMAL LOW (ref 98–111)
Creatinine, Ser: 2.42 mg/dL — ABNORMAL HIGH (ref 0.44–1.00)
GFR calc Af Amer: 21 mL/min — ABNORMAL LOW (ref >60)
GFR calc non Af Amer: 18 mL/min — ABNORMAL LOW (ref >60)
Glucose, Bld: 196 mg/dL — ABNORMAL HIGH (ref 70–99)
Potassium: 3.6 mmol/L (ref 3.5–5.1)
Sodium: 127 mmol/L — ABNORMAL LOW (ref 135–145)

## 2018-12-26 MED ORDER — QUETIAPINE FUMARATE 25 MG PO TABS
25.0000 mg | ORAL_TABLET | Freq: Every day | ORAL | Status: DC
  Administered 2018-12-26: 25 mg via ORAL
  Filled 2018-12-26: qty 1

## 2018-12-26 MED ORDER — INSULIN ASPART 100 UNIT/ML ~~LOC~~ SOLN
3.0000 [IU] | Freq: Three times a day (TID) | SUBCUTANEOUS | Status: DC
  Administered 2018-12-26 – 2018-12-28 (×6): 3 [IU] via SUBCUTANEOUS

## 2018-12-26 MED ORDER — SODIUM CHLORIDE 0.9 % IV BOLUS: 500.0000 mL | Freq: Once | INTRAVENOUS | Status: DC

## 2018-12-26 NOTE — Progress Notes (Signed)
Occupational Therapy Treatment Patient Details Name: Lori Hardy MRN: [id number] DOB: Feb 22, 1941 Today's Date: 12/26/2018    History of present illness Lori Hardy is a 78 y.o. female with hx of DM2, HTN and cirrhosis brought to ED by family w/ AMS, nausea and abd distension. Became confused at home.  Hx of low Na in the past, admitted for this in Feb 2020.   OT comments  Co treat with PT.  Pt needs SIGNIFICANT A.  Pt would have a hard time at SNF due to language and daughter not being able to visit.  Recommend 24/7 A with increased hours from caregiver to help daughter and hospital bed and HHOT   Follow Up Recommendations  Supervision/Assistance - 24 hour - [facility] bed    Recommendations for Other Services      Precautions / Restrictions Precautions Precautions: Fall Precaution Comments: speaks no english Restrictions Weight Bearing Restrictions: No       Mobility Bed Mobility Overal bed mobility: Needs Assistance Bed Mobility: Supine to Sit;Sit to Supine     Supine to sit: Max assist;+2 for physical assistance;+2 for safety/equipment Sit to supine: Max assist;+2 for physical assistance;+2 for safety/equipment   General bed mobility comments: Pt needed MAX encouragement and use of bed pads to get to EOB. Pt with tendency to try and throw self backwards.    Transfers                 General transfer comment: not attempted due to safety    Balance Overall balance assessment: Needs assistance Sitting-balance support: Single extremity supported;Bilateral upper extremity supported Sitting balance-Leahy Scale: Poor Sitting balance - Comments: poor safety awareness, will lie down without warning Postural control: Posterior lean                                 ADL either performed or assessed with clinical judgement   ADL       Grooming: Total assistance;Sitting;Brushing hair                                 General ADL Comments: pt with limited particpation with grooming activity     Vision Patient Visual Report: No change from baseline            Cognition Arousal/Alertness: Awake/alert Behavior During Therapy: WFL for tasks assessed/performed Overall Cognitive Status: Impaired/Different from baseline                                 General Comments: Difficult to assess due to language barrier. Pt swatting at PT at times. Daughter reports pt hasn't been sleeping well.                   Pertinent Vitals/ Pain       Pain Assessment: Faces Faces Pain Scale: Hurts little more Pain Location: stomach Pain Descriptors / Indicators: Grimacing Pain Intervention(s): Limited activity within patient's tolerance;Monitored during session;Repositioned      Progress Toward Goals  OT Goals(current goals can now be found in the care plan section)  Progress towards OT goals: OT to reassess next treatment         Co-evaluation      Reason for Co-Treatment: Complexity of the patient's impairments (multi-system involvement);For patient/therapist safety PT goals  addressed during session: Mobility/safety with mobility;Balance        AM-PAC OT "6 Clicks" Daily Activity     Outcome Measure   Help from another person eating meals?: A Lot Help from another person taking care of personal grooming?: Total Help from another person toileting, which includes using toliet, bedpan, or urinal?: Total Help from another person bathing (including washing, rinsing, drying)?: Total Help from another person to put on and taking off regular upper body clothing?: Total Help from another person to put on and taking off regular lower body clothing?: Total 6 Click Score: 7    End of Session        Activity Tolerance Patient tolerated treatment well   Patient Left in bed;with call bell/phone within reach   Nurse Communication Mobility status        Time:  1201-1223 OT Time Calculation (min): 22 min  Charges: OT General Charges $OT Visit: 1 Visit OT Treatments $Self Care/Home Management : 8-22 mins  Lise AuerLori Nocholas Damaso, OT Acute Rehabilitation Services [phone] Office- [phone]      Leilana Mcquire, Karin GoldenLorraine D 12/26/2018, 2:16 PM

## 2018-12-26 NOTE — Progress Notes (Signed)
[facility] Progress Note   Subjective/Interval events:    Creat up to 2.42 today , UOP 240 cc.    Objective Vitals:   12/25/18 1317 12/25/18 2036 12/26/18 0431 12/26/18 1352  BP: (!) 144/60 134/61 (!) 146/64 139/69  Pulse: 90 71 84 78  Resp: 18 17 18 18   Temp: 98.9 F (37.2 C) 98.8 F (37.1 C) 98.3 F (36.8 C) 98.6 F (37 C)  TempSrc: Oral   Oral  SpO2: 97% 96% 99% 97%  Weight: 84.3 kg  80.3 kg   Height: 5\' 3"  (1.6 m)      Physical Exam General: awake, alert, lying at 45degrees in no distress  Heart: RRR, no rub Lungs: clear ant Abdomen: soft, mod distended, nontender Extremities: bilat dependent hip/ thigh edema and UE edema 2+, no pretib edema Neuro: awake, moves arms , not legs much , interacting   CXR 8/17 yest showed no RLL infiltrates, poss IS pattern   Assessment/Plan: Hyponatremia:  Severe, acute on chronic, w/ hypervolemia and cirrhosis. ECHO showed normal LVEF.  Na 114 on admit improved to 126- 129 range w/ lasix/ albumin and fluid restriction.  Don't expect this can be resolved. Lasix on hold w/ rising creat.   AKI on CKD III-  baseline creat 1.1-1.3, creat up to 2.4 today. Will have to hold all diuretics.   Edema : due to cirrhosis most likely. Better. Holding diuretics due to ^Cr  Mild hypoxic resp failure: RLL infiltrates on yest CXR, pulm edema vs asp pneumonitis. ^WBC so started on IV Unasyn. Creat continues to rise on IV lasix which argues against "CHF" by cxr.    Abd pain:  Initial CT with possible colitis, no ascites.  Serial abd x rays unrevealing.  GI consulting.    E coli UTI: +ESBL+, on IV abx, per primary  HTN: on metop/ hydralazine/ amlodipine  H/o seizure d/o:  Had seizure in similar setting, was started on keppra but d/c'd at neurology f/u.  Correcting underlying issues that may have promoted seizure in past.   Debility: per discussions w/ family pt has become bed to [facility] sometime around 2018- 2019.  ?Consider  palliative consult for Rosana Fret, MD 12/26/2018, 4:21 PM   Additional Objective Labs: Basic Metabolic Panel: Recent Labs  Lab 12/24/18 0530 12/25/18 0216 12/26/18 0529  NA 128* 126* 127*  K 5.3* 4.0 3.6  CL 96* 94* 94*  CO2 21* 20* 20*  GLUCOSE 145* 220* 196*  BUN 19 21 29*  CREATININE 1.41* 1.87* 2.42*  CALCIUM 8.5* 8.8* 9.0   Liver Function Tests: Recent Labs  Lab 12/22/18 0215 12/25/18 0216  ALBUMIN 2.8* 4.9   Recent Labs  Lab 12/21/18 0630  LIPASE 20   CBC: Recent Labs  Lab 12/20/18 0222 12/21/18 0612 12/24/18 0738 12/24/18 1250 12/25/18 1401  WBC 9.5 8.3 8.5  --  14.7*  NEUTROABS  --  4.5 5.7  --   --   HGB 10.5* 9.9* 8.4*  --  8.0*  HCT 32.2* 31.6* 26.4* 25.7* 25.7*  MCV 90.2 92.1 90.7  --  93.8  PLT 190 212 158  --  171   Blood Culture    Component Value Date/Time   SDES  12/21/2018 3557    BLOOD RIGHT HAND Performed at [facility], [place] [address], [place], [place] [address]    SPECREQUEST  12/21/2018 [id number]    BOTTLES DRAWN AEROBIC ONLY Blood Culture adequate volume Performed at [facility],  [address] [place], [place] [address]    CULT  12/21/2018 [phone]    NO GROWTH 5 DAYS Performed at [facility], [address] [place], [place] [address]    REPTSTATUS 12/26/2018 FINAL 12/21/2018 [id number]    Cardiac Enzymes: No results for input(s): CKTOTAL, CKMB, CKMBINDEX, TROPONINI in the last 168 hours. CBG: Recent Labs  Lab 12/25/18 2037 12/26/18 0430 12/26/18 0757 12/26/18 1132 12/26/18 1618  GLUCAP 297* 188* 195* 206* 198*   Iron Studies:  Recent Labs    12/24/18 0737  IRON 32  TIBC 222*  FERRITIN 55   @lablastinr3 @ Studies/Results: Mr Brain Wo Contrast  Result Date: 12/25/2018 CLINICAL DATA:  Encephalopathy. EXAM: MRI HEAD WITHOUT CONTRAST TECHNIQUE: Multiplanar, multiecho pulse sequences of the brain and surrounding structures were obtained without intravenous contrast.  COMPARISON:  Head CT 12/19/2018, CT angiogram head/neck 06/18/2018, brain MRI 06/18/2018 FINDINGS: Brain: Please note the patient did not tolerate the full examination. The acquired axial T1 weighted imaging is severely motion degraded. A coronal T2 weighted sequence could not be acquired. No evidence of acute infarct. No evidence of intracranial mass. No chronic intracranial hemorrhage. No midline shift or extra-axial collection. Moderate generalized cerebral atrophy. Advanced scattered and confluent T2/FLAIR hyperintensity within the cerebral white matter, bilateral basal ganglia and brainstem is nonspecific, but consistent with chronic small vessel ischemic disease and similar to prior MRI 06/18/2018. Vascular: Flow voids maintained within the proximal large vessels. Skull and upper cervical spine: Normal marrow signal. Sinuses/Orbits: Imaged globes and orbits demonstrate no acute abnormality. Mild scattered paranasal sinus mucosal thickening. Bilateral mastoid effusions (greater on the left). IMPRESSION: Motion degraded and prematurely terminated examination as described. No evidence of acute intracranial abnormality. Moderate generalized parenchymal atrophy. Advanced signal changes within the cerebral white matter, bilateral basal ganglia and brainstem which are non-specific but most commonly seen in the setting of chronic small vessel ischemic disease. Findings are similar to prior MRI 06/18/2018. Bilateral mastoid effusions. Electronically Signed   By: Jackey LogeKyle  Golden   On: 12/25/2018 12:12   Dg Chest Port 1 View  Result Date: 12/25/2018 CLINICAL DATA:  Increased shortness of breath today. EXAM: PORTABLE CHEST 1 VIEW COMPARISON:  Chest x-rays dated 12/19/2018 and 06/18/2018. FINDINGS: New bibasilar opacities, RIGHT greater than LEFT. Increased interstitial prominence bilaterally, presumably interstitial edema. Stable cardiomegaly. No pneumothorax seen. IMPRESSION: 1. Cardiomegaly with bilateral interstitial  prominence, presumably edema, indicating CHF/volume overload. 2. New opacity at the RIGHT lung base. This could represent pneumonia, atelectasis or asymmetric pulmonary edema. Also suspect a RIGHT pleural effusion. 3. New opacity at the LEFT lung base, likely atelectasis and/or small pleural effusion. Electronically Signed   By: Bary RichardStan  Maynard M.D.   On: 12/25/2018 10:44   Medications: . sodium chloride 10 mL/hr at 12/22/18 1127  . ampicillin-sulbactam (UNASYN) IV Stopped (12/26/18 0914)  . sodium chloride     . amLODipine  10 mg Oral Daily  . Chlorhexidine Gluconate Cloth  6 each Topical Daily  . enoxaparin (LOVENOX) injection  30 mg Subcutaneous Q24H  . feeding supplement (GLUCERNA SHAKE)  237 mL Oral TID BM  . hydrALAZINE  50 mg Oral Q8H  . insulin aspart  0-15 Units Subcutaneous TID WC  . insulin aspart  0-5 Units Subcutaneous QHS  . insulin aspart  3 Units Subcutaneous TID WC  . insulin glargine  7 Units Subcutaneous Daily  . latanoprost  1 drop Both Eyes QHS  . levothyroxine  25 mcg Oral QAC breakfast  . metoprolol tartrate  25 mg Oral BID  . multivitamin with minerals  1 tablet Oral Daily  . pantoprazole  40 mg Oral Daily  . polyethylene glycol  17 g Oral BID  . QUEtiapine  25 mg Oral QHS  . rosuvastatin  10 mg Oral QHS  . senna-docusate  2 tablet Oral BID  . sodium chloride flush  3 mL Intravenous Q12H

## 2018-12-26 NOTE — Progress Notes (Signed)
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 Patient was noted to have hyponatremia with sodium 114 on presentation.  She was also noted to have leg edema and abdominal distention hence started on IV diuretics for presumed anasarca.  However CT abdomen did not show any evidence of ascites.  Patient did not show any evidence of pulmonary edema.  Her albumin level on admission labs was 3.1.  She has been receiving IV albumin every 8 hours since admission. Patient has remained confused during the hospital course with intermittent retching and complaints of abdominal pain.  She was seen by GI and noted to have hard stool/possible impaction and was disimpacted.  She has had several bowel movements since-soft stools per nurse.  No melena or hematochezia.  CT abdomen on admission did raise the possibility of proximal colitis and she has history of SMA atherosclerosis which raises the concern for possible intestinal/mesenteric ischemia and GI contemplating on possible colonoscopy. Patient has also been treated with Rocephin/Flagyl during the hospital course.  Of note, patient had similar presentation in February with altered mental status and hyponatremia with sodium 124 at which time she was extensively evaluated by neurology.  She was treated for UTI/metabolic encephalopathy and subsequently discharged to rehab.  In that admission patient also received Keppra for possible seizure prophylaxis.  Her MRI showed  remote lacunar infarct and no new strokes.  At the time of discharge it appears that she was able to ambulate short distances with a walker and was arranged for home health services.  In the current admission, her urine cultures grew Carbapenem resistant E.coli and patient seen by ID, received 1 dose of fosfomycin on 8/16.   Subjective: Patient calm and drowsy when seen this morning in rounds although she could not name her children or place today and disoriented to time as well.  Son visited her this afternoon and apparently disoriented/calling out names of old friends/relatives in [place].  O2 requirement at 2 L today.  Objective: Vitals:   12/25/18 1317 12/25/18 2036 12/26/18 0431 12/26/18 1352  BP: (!) 144/60 134/61 (!) 146/64 139/69  Pulse: 90 71 84 78  Resp: 18 17 18 18   Temp: 98.9 F (37.2 C) 98.8 F (37.1 C) 98.3 F (36.8 C) 98.6 F (37 C)  TempSrc: Oral   Oral  SpO2: 97% 96% 99% 97%  Weight: 84.3 kg  80.3 kg   Height: 5\' 3"  (1.6 m)       Intake/Output Summary (Last 24 hours) at 12/26/2018 1534 Last data filed at 12/26/2018 0900 Gross per 24 hour  Intake 280 ml  Output 278 ml  Net 2 ml   Filed Weights   12/25/18 0500 12/25/18 1317 12/26/18 0431  Weight: 81.2 kg 84.3 kg 80.3 kg    Physical Examination:  General exam: Appears calm and comfortable  Respiratory system: Clear to auscultation. Respiratory effort normal. Cardiovascular system: S1 & S2 heard, RRR. No JVD, murmurs, rubs, gallops or clicks.  Trace leg/pedal edema. Gastrointestinal system: Abdomen is nondistended, soft and nontender. No organomegaly or masses  felt. Normal bowel sounds heard. Central nervous system: Somnolent, disoriented. No focal neurological deficits. Extremities: Moving all extremities, trace leg edema Skin: No rashes, lesions or ulcers Psychiatry: Judgement and insight appear impaired.  Mood & affect anxious    Data Reviewed: I have personally reviewed following labs and imaging  studies  CBC: Recent Labs  Lab 12/20/18 0222 12/21/18 0612 12/24/18 0738 12/24/18 1250 12/25/18 1401  WBC 9.5 8.3 8.5  --  14.7*  NEUTROABS  --  4.5 5.7  --   --   HGB 10.5* 9.9* 8.4*  --  8.0*  HCT 32.2* 31.6* 26.4* 25.7* 25.7*  MCV 90.2 92.1 90.7  --  93.8  PLT 190 212 158  --  171   Basic Metabolic Panel: Recent Labs  Lab 12/22/18 1640 12/23/18 0148 12/24/18 0530 12/25/18 0216 12/26/18 0529  NA 127* 126* 128* 126* 127*  K 4.7 4.4 5.3* 4.0 3.6  CL 95* 94* 96* 94* 94*  CO2 23 20* 21* 20* 20*  GLUCOSE 216* 234* 145* 220* 196*  BUN 20 19 19 21  29*  CREATININE 1.50* 1.49* 1.41* 1.87* 2.42*  CALCIUM 8.2* 8.2* 8.5* 8.8* 9.0   GFR: Estimated Creatinine Clearance: 18.9 mL/min (A) (by C-G formula based on SCr of 2.42 mg/dL (H)). Liver Function Tests: Recent Labs  Lab 12/22/18 0215 12/25/18 0216  ALBUMIN 2.8* 4.9   Recent Labs  Lab 12/21/18 0630  LIPASE 20   No results for input(s): AMMONIA in the last 168 hours. Coagulation Profile: No results for input(s): INR, PROTIME in the last 168 hours. Cardiac Enzymes: No results for input(s): CKTOTAL, CKMB, CKMBINDEX, TROPONINI in the last 168 hours. BNP (last 3 results) No results for input(s): PROBNP in the last 8760 hours. HbA1C: No results for input(s): HGBA1C in the last 72 hours. CBG: Recent Labs  Lab 12/25/18 1559 12/25/18 2037 12/26/18 0430 12/26/18 0757 12/26/18 1132  GLUCAP 250* 297* 188* 195* 206*   Lipid Profile: No results for input(s): CHOL, HDL, LDLCALC, TRIG, CHOLHDL, LDLDIRECT in the last 72 hours. Thyroid Function Tests: No results for input(s): TSH, T4TOTAL, FREET4, T3FREE, THYROIDAB in the last 72 hours. Anemia Panel: Recent Labs    12/24/18 0737 12/24/18 0738  VITAMINB12 354  --   FERRITIN 55  --   TIBC 222*  --   IRON 32  --   RETICCTPCT  --  1.7   Sepsis Labs: Recent Labs  Lab 12/21/18 0612  PROCALCITON <0.10  LATICACIDVEN 0.8    Recent Results (from the past 240  hour(s))  Urine culture     Status: Abnormal   Collection Time: 12/19/18  5:15 AM   Specimen: Urine, Catheterized  Result Value Ref Range Status   Specimen Description   Final    URINE, CATHETERIZED Performed at [facility], [address], [place], [place] [address]    Special Requests   Final    NONE Performed at [facility], [address] [place], [place] [address]    Culture (A)  Final    >=100,000 COLONIES/mL ESCHERICHIA COLI Confirmed Extended Spectrum Beta-Lactamase Producer (ESBL).  In bloodstream infections from ESBL organisms, carbapenems are preferred over piperacillin/tazobactam. They are shown to have a lower risk of mortality. CONFIRMED CARBAPENEMASE RESISTANT ENTEROBACTERIACAE    Report Status 12/24/2018 FINAL  Final   Organism ID, Bacteria ESCHERICHIA COLI (A)  Final      Susceptibility   Escherichia coli - MIC*    AMPICILLIN >=32 RESISTANT Resistant  CEFAZOLIN >=64 RESISTANT Resistant     CEFTRIAXONE >=64 RESISTANT Resistant     CIPROFLOXACIN >=4 RESISTANT Resistant     GENTAMICIN >=16 RESISTANT Resistant     IMIPENEM >=16 RESISTANT Resistant     NITROFURANTOIN 32 SENSITIVE Sensitive     TRIMETH/SULFA >=320 RESISTANT Resistant     AMPICILLIN/SULBACTAM >=32 RESISTANT Resistant     PIP/TAZO >=128 RESISTANT Resistant     Extended ESBL POSITIVE Resistant     * >=100,000 COLONIES/mL ESCHERICHIA COLI  Carbapenem Resistance Panel     Status: Abnormal   Collection Time: 12/19/18  5:20 AM  Result Value Ref Range Status   Carba Resistance IMP Gene NOT DETECTED NOT DETECTED Final   Carba Resistance VIM Gene NOT DETECTED NOT DETECTED Final   Carba Resistance NDM Gene DETECTED (A) NOT DETECTED Final    Comment: CRITICAL RESULT CALLED TO, READ BACK BY AND VERIFIED WITH: RN ZOE SUGGS 1644 [id number] FCP    Carba Resistance KPC Gene NOT DETECTED NOT DETECTED Final   Carba Resistance OXA48 Gene NOT DETECTED NOT DETECTED Final     Comment: (NOTE) Cepheid Carba-R is an FDA-cleared nucleic acid amplification test  (NAAT)for the detection and differentiation of genes encoding the  most prevalent carbapenemases in bacterial isolate samples. Carbapenemase gene identification and implementation of comprehensive  infection control measures are recommended by the CDC to prevent the  spread of the resistant organisms. Performed at [facility], [address] [place], [place] [address]   SARS CORONAVIRUS 2 Nasal Swab Aptima Multi Swab     Status: None   Collection Time: 12/19/18  5:24 AM   Specimen: Aptima Multi Swab; Nasal Swab  Result Value Ref Range Status   SARS Coronavirus 2 NEGATIVE NEGATIVE Final    Comment: (NOTE) SARS-CoV-2 target nucleic acids are NOT DETECTED. The SARS-CoV-2 RNA is generally detectable in upper and lower respiratory specimens during the acute phase of infection. Negative results do not preclude SARS-CoV-2 infection, do not rule out co-infections with other pathogens, and should not be used as the sole basis for treatment or other patient management decisions. Negative results must be combined with clinical observations, patient history, and epidemiological information. The expected result is Negative. Fact Sheet for Patients: HairSlick.nohttps://www.fda.gov/media/138098/download Fact Sheet for Healthcare Providers: quierodirigir.comhttps://www.fda.gov/media/138095/download This test is not yet approved or cleared by the [place] FDA and  has been authorized for detection and/or diagnosis of SARS-CoV-2 by FDA under an Emergency Use Authorization (EUA). This EUA will remain  in effect (meaning this test can be used) for the duration of the COVID-19 declaration under Section 56 4(b)(1) of the Act, 21 U.S.C. section 360bbb-3(b)(1), unless the authorization is terminated or revoked sooner. Performed at [facility], [address] [place], [place] [address]   MRSA PCR Screening     Status: None    Collection Time: 12/19/18 12:29 PM   Specimen: Nasal Mucosa; Nasopharyngeal  Result Value Ref Range Status   MRSA by PCR NEGATIVE NEGATIVE Final    Comment:        The GeneXpert MRSA Assay (FDA approved for NASAL specimens only), is one component of a comprehensive MRSA colonization surveillance program. It is not intended to diagnose MRSA infection nor to guide or monitor treatment for MRSA infections. Performed at [facility], [address] [place], [place] [address]   Culture, blood (routine x 2)     Status: None   Collection Time: 12/21/18  6:12 AM   Specimen:  BLOOD RIGHT ARM  Result Value Ref Range Status   Specimen Description   Final    BLOOD RIGHT ARM Performed at [facility], [address] [place], [place] [address]    Special Requests   Final    BOTTLES DRAWN AEROBIC ONLY Blood Culture adequate volume Performed at [facility], [address], [place], [place] [address]    Culture   Final    NO GROWTH 5 DAYS Performed at [facility], [address], [place], [place] [address]    Report Status 12/26/2018 FINAL  Final  Culture, blood (routine x 2)     Status: None   Collection Time: 12/21/18  6:13 AM   Specimen: BLOOD RIGHT HAND  Result Value Ref Range Status   Specimen Description   Final    BLOOD RIGHT HAND Performed at [facility], [address] [place], [place] [address]    Special Requests   Final    BOTTLES DRAWN AEROBIC ONLY Blood Culture adequate volume Performed at [facility], [address], [place], [place] [address]    Culture   Final    NO GROWTH 5 DAYS Performed at [facility], [address] [place], [place] [address]    Report Status 12/26/2018 FINAL  Final      Radiology Studies: Mr Brain Wo Contrast  Result Date: 12/25/2018 CLINICAL DATA:  Encephalopathy. EXAM: MRI HEAD WITHOUT CONTRAST TECHNIQUE: Multiplanar,  multiecho pulse sequences of the brain and surrounding structures were obtained without intravenous contrast. COMPARISON:  Head CT 12/19/2018, CT angiogram head/neck 06/18/2018, brain MRI 06/18/2018 FINDINGS: Brain: Please note the patient did not tolerate the full examination. The acquired axial T1 weighted imaging is severely motion degraded. A coronal T2 weighted sequence could not be acquired. No evidence of acute infarct. No evidence of intracranial mass. No chronic intracranial hemorrhage. No midline shift or extra-axial collection. Moderate generalized cerebral atrophy. Advanced scattered and confluent T2/FLAIR hyperintensity within the cerebral white matter, bilateral basal ganglia and brainstem is nonspecific, but consistent with chronic small vessel ischemic disease and similar to prior MRI 06/18/2018. Vascular: Flow voids maintained within the proximal large vessels. Skull and upper cervical spine: Normal marrow signal. Sinuses/Orbits: Imaged globes and orbits demonstrate no acute abnormality. Mild scattered paranasal sinus mucosal thickening. Bilateral mastoid effusions (greater on the left). IMPRESSION: Motion degraded and prematurely terminated examination as described. No evidence of acute intracranial abnormality. Moderate generalized parenchymal atrophy. Advanced signal changes within the cerebral white matter, bilateral basal ganglia and brainstem which are non-specific but most commonly seen in the setting of chronic small vessel ischemic disease. Findings are similar to prior MRI 06/18/2018. Bilateral mastoid effusions. Electronically Signed   By: Jackey Loge   On: 12/25/2018 12:12   Dg Chest Port 1 View  Result Date: 12/25/2018 CLINICAL DATA:  Increased shortness of breath today. EXAM: PORTABLE CHEST 1 VIEW COMPARISON:  Chest x-rays dated 12/19/2018 and 06/18/2018. FINDINGS: New bibasilar opacities, RIGHT greater than LEFT. Increased interstitial prominence bilaterally, presumably  interstitial edema. Stable cardiomegaly. No pneumothorax seen. IMPRESSION: 1. Cardiomegaly with bilateral interstitial prominence, presumably edema, indicating CHF/volume overload. 2. New opacity at the RIGHT lung base. This could represent pneumonia, atelectasis or asymmetric pulmonary edema. Also suspect a RIGHT pleural effusion. 3. New opacity at the LEFT lung base, likely atelectasis and/or small pleural effusion. Electronically Signed   By: Bary Richard M.D.   On: 12/25/2018 10:44  Scheduled Meds:  amLODipine  10 mg Oral Daily   Chlorhexidine Gluconate Cloth  6 each Topical Daily   enoxaparin (LOVENOX) injection  30 mg Subcutaneous Q24H   feeding supplement (GLUCERNA SHAKE)  237 mL Oral TID BM   hydrALAZINE  50 mg Oral Q8H   insulin aspart  0-15 Units Subcutaneous TID WC   insulin aspart  0-5 Units Subcutaneous QHS   insulin glargine  7 Units Subcutaneous Daily   latanoprost  1 drop Both Eyes QHS   levothyroxine  25 mcg Oral QAC breakfast   metoprolol tartrate  25 mg Oral BID   multivitamin with minerals  1 tablet Oral Daily   pantoprazole  40 mg Oral Daily   polyethylene glycol  17 g Oral BID   QUEtiapine  25 mg Oral QHS   rosuvastatin  10 mg Oral QHS   senna-docusate  2 tablet Oral BID   sodium chloride flush  3 mL Intravenous Q12H   Continuous Infusions:  sodium chloride 10 mL/hr at 12/22/18 1127   ampicillin-sulbactam (UNASYN) IV 1.5 g (12/26/18 0841)   sodium chloride      Assessment & Plan:    1.  Altered mental status/metabolic encephalopathy: Secondary to hyponatremia versus UTI versus cognitive decline.  Sodium level appears to be improved and now fluctuating between 126-128 which is likely her baseline.  Of note, patient sodium level was at 128 in November 2018 as well.  Repeat MRI obtained on 8/17 showed similar findings from February with significant cerebral cortical volume loss/chronic small vessel disease.  I suspect cognitive  decline over the last year or so.  Discussed with son that she may benefit from neurocognitive evaluation as outpatient.  Will try Seroquel for possible hallucinations/delirium and see if she will improve.  Will obtain EKG for QTC.    2.  Hyponatremia: Appears to be chronic.  Her sodium levels per record in November 2018 was 128, she presented in February 2020 with sodium of 124 which corrected to 136 at the time of discharge.  Now she presented with sodium of 114 and improved to 128.  She has been receiving IV diuretics for volume overload/leg edema on a daily basis (20 mg on 8/12 ,8/14, 40 mg on 8/15 &8/16, 20 mg on 8/17).  Renal following and discussed with Dr. Vida RollerSchuetz who agrees that patient's baseline is likely around 128.  3.  Acute kidney injury on CKD stage II: Patient's baseline creatinine is around 1 -1.2.  It has been rising over the last 2 days in the setting of IV diuretics.  Creatinine jumped from 1.8-2.4 today. Will hold diuretics.  Will avoid IV hydration as well given chest x-ray findings of pulmonary edema and new hypoxia with elevated BNP >1100  4.  Mild hypoxic acute respiratory failure: Secondary to CHF versus aspiration pneumonia.  Patient noted to be hypoxic on the night of 8/16 with O2 sats in the high 80s.  She was placed on 2 L O2 via nasal cannula. Chest x-ray shows diffuse haziness consistent with pulmonary edema versus aspiration pneumonitis.  Given leukocytosis, started on Unasyn.  She also received extra dose of Lasix last night, holding diuretics today in view of problem #3. May have underlying sleep apnea as well.  5.  Leg edema: Secondary to hypoalbuminemia versus acute diastolic CHF in the setting of uncontrolled blood pressure.  Improved with IV diuretics.  Admission x-ray did not show any pulmonary edema and CT abdomen did not show any ascites.  Currently only  has trace edema and looks intravascularly depleted.  Hold diuretics/IV albumin.  Echo in this admission showed  preserved EF, mild diastolic impairment  6.Abdominal pain/constipation: Patient seen by GI and CT suggestive of possible proximal colitis.  She finished antibiotic course with cefepime/Flagyl in the initial hospital course.  Patient is unreliable historian and does not complain of abdominal pain when distracted.  On MiraLAX/Senokot.  Added lactulose as needed. On opiates for pain. D/Ced IV dilaudid in concern for problem #1   7.  Diabetes mellitus: On Lantus 7 units/sliding scale insulin.  Blood glucose elevated currently at 290s.  Likely due to variable diet.  Will add pre-meal insulin.  8.  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  Ammonia level at 11 on admission.  Albumin at 3.1 and now improved to 4.5.  DCed albumin infusions as she is not hypotensive.  Hold diuretics for reasons mentioned above.  Would prefer using lactulose for constipation as might improve mental status in the setting of liver disease.  9.  Acute on chronic anemia: AOCD versus dilutional.  Labs show normal iron level, ferritin at 55, normal saturation and low TIBC.  Fecal occult blood test negative.  Defer need for colonoscopy to GI  10.  Hypertension: Her blood pressure has fluctuated between systolic 130 to  295A190s during the hospital course.  Continue Norvasc, hydralazine, metoprolol.  IV hydralazine for PRN use.   DVT prophylaxis: On renal dose Lovenox Code Status: Full code Family / Patient Communication: Discussed with patient in Hindi.  Discussed with son,Narendra yesterday as well as today.  He prefers to take patient home when medically cleared for discharge. Disposition Plan: Seen by PT/OT who recommended home health services and DME/hospital bed upon discharge.       LOS: 7 days    Time spent: 65 minutes    Alessandra BevelsNeelima Hanish Laraia, MD [facility] Pager [phone]  If 7PM-7AM, please contact night-coverage [email or web address] Password [email or web address] 12/26/2018,  3:34 PM

## 2018-12-26 NOTE — Progress Notes (Signed)
Physical Therapy Treatment Patient Details Name: Lori Hardy MRN: [id number] DOB: 1941/04/14 Today's Date: 12/26/2018    History of Present Illness Lori Hardy is a 78 y.o. female with hx of DM2, HTN and cirrhosis brought to ED by family w/ AMS, nausea and abd distension. Became confused at home.  Hx of low Na in the past, admitted for this in Feb 2020.    PT Comments    Pt requiring MAX A of 2 for bed mobility today.  Daughter present and states she would get her mother up by herself.  Discussed that right now she would need more A.  Discussed SNF, but due to visitor restrictions, this may be difficult with language barrier and family not being able to come assist with translation.  At this time recommend a hospital bed, HHPT and more assistance from aides than family is currently getting.     Follow Up Recommendations  Home health PT     Equipment Recommendations  Hospital bed    Recommendations for Other Services       Precautions / Restrictions Precautions Precautions: Fall Precaution Comments: speaks no english Restrictions Weight Bearing Restrictions: No    Mobility  Bed Mobility Overal bed mobility: Needs Assistance Bed Mobility: Supine to Sit;Sit to Supine     Supine to sit: Max assist;+2 for physical assistance;+2 for safety/equipment Sit to supine: Max assist;+2 for physical assistance;+2 for safety/equipment   General bed mobility comments: Pt needed MAX encouragement and use of bed pads to get to EOB. Pt with tendency to try and throw self backwards.    Transfers                 General transfer comment: not attempted due to safety  Ambulation/Gait                 Stairs             Wheelchair Mobility    Modified Rankin (Stroke Patients Only)       Balance Overall balance assessment: Needs assistance Sitting-balance support: Single extremity supported;Bilateral upper extremity supported Sitting balance-Leahy Scale:  Poor Sitting balance - Comments: poor safety awareness, will lie down without warning Postural control: Posterior lean                                  Cognition Arousal/Alertness: Awake/alert Behavior During Therapy: WFL for tasks assessed/performed Overall Cognitive Status: Impaired/Different from baseline                                 General Comments: Difficult to assess due to language barrier. Pt swatting at PT at times. Daughter reports pt hasn't been sleeping well.      Exercises      General Comments        Pertinent Vitals/Pain Pain Assessment: Faces Faces Pain Scale: Hurts a little bit Pain Location: stomach Pain Descriptors / Indicators: Grimacing Pain Intervention(s): Limited activity within patient's tolerance;Monitored during session;Repositioned    Home Living                      Prior Function            PT Goals (current goals can now be found in the care plan section)      Frequency    Min 3X/week  PT Plan Discharge plan needs to be updated    Co-evaluation PT/OT/SLP Co-Evaluation/Treatment: Yes Reason for Co-Treatment: Complexity of the patient's impairments (multi-system involvement);For patient/therapist safety PT goals addressed during session: Mobility/safety with mobility;Balance        AM-PAC PT "6 Clicks" Mobility   Outcome Measure  Help needed turning from your back to your side while in a flat bed without using bedrails?: A Lot Help needed moving from lying on your back to sitting on the side of a flat bed without using bedrails?: Total Help needed moving to and from a bed to a chair (including a wheelchair)?: Total Help needed standing up from a chair using your arms (e.g., wheelchair or bedside chair)?: Total Help needed to walk in hospital room?: Total Help needed climbing 3-5 steps with a railing? : Total 6 Click Score: 7    End of Session   Activity Tolerance: Patient  limited by fatigue Patient left: in bed;with call bell/phone within reach;with family/visitor present Nurse Communication: Mobility status PT Visit Diagnosis: Muscle weakness (generalized) (M62.81);Difficulty in walking, not elsewhere classified (R26.2)     Time: 1610-9604 PT Time Calculation (min) (ACUTE ONLY): 23 min  Charges:  $Therapeutic Activity: 8-22 mins                     Torryn Fiske L. Tamala Julian, [place] Pager [phone] 12/26/2018    Galen Manila 12/26/2018, 12:47 PM

## 2018-12-27 ENCOUNTER — Inpatient Hospital Stay (HOSPITAL_COMMUNITY): Payer: Medicare Other

## 2018-12-27 DIAGNOSIS — G9340 Encephalopathy, unspecified: Secondary | ICD-10-CM | POA: Diagnosis not present

## 2018-12-27 DIAGNOSIS — E871 Hypo-osmolality and hyponatremia: Secondary | ICD-10-CM | POA: Diagnosis not present

## 2018-12-27 DIAGNOSIS — G9341 Metabolic encephalopathy: Secondary | ICD-10-CM | POA: Diagnosis not present

## 2018-12-27 LAB — CBC
HCT: 25 % — ABNORMAL LOW (ref 36.0–46.0)
Hemoglobin: 7.8 g/dL — ABNORMAL LOW (ref 12.0–15.0)
MCH: 29.3 pg (ref 26.0–34.0)
MCHC: 31.2 g/dL (ref 30.0–36.0)
MCV: 94 fL (ref 80.0–100.0)
Platelets: 167 10*3/uL (ref 150–400)
RBC: 2.66 MIL/uL — ABNORMAL LOW (ref 3.87–5.11)
RDW: 15.4 % (ref 11.5–15.5)
WBC: 12.4 10*3/uL — ABNORMAL HIGH (ref 4.0–10.5)
nRBC: 0.2 % (ref 0.0–0.2)

## 2018-12-27 LAB — URINALYSIS, ROUTINE W REFLEX MICROSCOPIC
Bilirubin Urine: NEGATIVE
Glucose, UA: 50 mg/dL — AB
Hgb urine dipstick: NEGATIVE
Ketones, ur: 5 mg/dL — AB
Nitrite: NEGATIVE
Protein, ur: >=300 mg/dL — AB
Specific Gravity, Urine: 1.02 (ref 1.005–1.030)
WBC, UA: >50 WBC/hpf — ABNORMAL HIGH (ref 0–5)
pH: 5 (ref 5.0–8.0)

## 2018-12-27 LAB — BASIC METABOLIC PANEL
Anion gap: 13 (ref 5–15)
BUN: 41 mg/dL — ABNORMAL HIGH (ref 8–23)
CO2: 18 mmol/L — ABNORMAL LOW (ref 22–32)
Calcium: 8.8 mg/dL — ABNORMAL LOW (ref 8.9–10.3)
Chloride: 97 mmol/L — ABNORMAL LOW (ref 98–111)
Creatinine, Ser: 3.39 mg/dL — ABNORMAL HIGH (ref 0.44–1.00)
GFR calc Af Amer: 14 mL/min — ABNORMAL LOW (ref >60)
GFR calc non Af Amer: 12 mL/min — ABNORMAL LOW (ref >60)
Glucose, Bld: 236 mg/dL — ABNORMAL HIGH (ref 70–99)
Potassium: 5 mmol/L (ref 3.5–5.1)
Sodium: 128 mmol/L — ABNORMAL LOW (ref 135–145)

## 2018-12-27 LAB — GLUCOSE, CAPILLARY
Glucose-Capillary: 227 mg/dL — ABNORMAL HIGH (ref 70–99)
Glucose-Capillary: 180 mg/dL — ABNORMAL HIGH (ref 70–99)
Glucose-Capillary: 170 mg/dL — ABNORMAL HIGH (ref 70–99)
Glucose-Capillary: 240 mg/dL — ABNORMAL HIGH (ref 70–99)

## 2018-12-27 LAB — CREATININE, URINE, RANDOM: Creatinine, Urine: 137.29 mg/dL

## 2018-12-27 LAB — ALBUMIN: Albumin: 4 g/dL (ref 3.5–5.0)

## 2018-12-27 LAB — SODIUM, URINE, RANDOM: Sodium, Ur: 40 mmol/L

## 2018-12-27 MED ORDER — QUETIAPINE FUMARATE 25 MG PO TABS
12.5000 mg | ORAL_TABLET | Freq: Every day | ORAL | Status: DC
  Administered 2018-12-27: 22:00:00 12.5 mg via ORAL
  Filled 2018-12-27: qty 1

## 2018-12-27 MED ORDER — HEPARIN SODIUM (PORCINE) 5000 UNIT/ML IJ SOLN
5000.0000 [IU] | Freq: Three times a day (TID) | INTRAMUSCULAR | Status: DC
  Administered 2018-12-27 – 2018-12-28 (×3): 5000 [IU] via SUBCUTANEOUS
  Filled 2018-12-27 (×3): qty 1

## 2018-12-27 MED ORDER — METOPROLOL TARTRATE 12.5 MG HALF TABLET: 12.5000 mg | ORAL_TABLET | Freq: Two times a day (BID) | ORAL | Status: DC

## 2018-12-27 MED ORDER — OCTREOTIDE ACETATE 50 MCG/ML IJ SOLN
150.0000 ug | Freq: Three times a day (TID) | INTRAMUSCULAR | Status: DC
  Administered 2018-12-27 – 2018-12-28 (×3): 150 ug via SUBCUTANEOUS
  Filled 2018-12-27 (×4): qty 3

## 2018-12-27 MED ORDER — AMLODIPINE BESYLATE 5 MG PO TABS: 5.0000 mg | ORAL_TABLET | Freq: Every day | ORAL | Status: DC

## 2018-12-27 MED ORDER — HYDRALAZINE HCL 25 MG PO TABS
25.0000 mg | ORAL_TABLET | Freq: Three times a day (TID) | ORAL | Status: DC
  Administered 2018-12-27 – 2018-12-28 (×2): 25 mg via ORAL
  Filled 2018-12-27 (×2): qty 1

## 2018-12-27 NOTE — Progress Notes (Addendum)
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 Patient was noted to have hyponatremia with sodium 114 on presentation.  She was also noted to have leg edema and abdominal distention hence started on IV diuretics for presumed anasarca.  However CT abdomen did not show any evidence of ascites.  Patient did not show any evidence of pulmonary edema.  Her albumin level on admission labs was 3.1.  She has been receiving IV albumin every 8 hours since admission. Patient has remained confused during the hospital course with intermittent retching and complaints of abdominal pain.  She was seen by GI and noted to have hard stool/possible impaction and was disimpacted.  She has had several bowel movements since-soft stools per nurse.  No melena or hematochezia.  CT abdomen on admission did raise the possibility of proximal colitis and she has history of SMA atherosclerosis which raises the concern for possible intestinal/mesenteric ischemia and GI contemplating on possible colonoscopy. Patient has also been treated with Rocephin/Flagyl during the hospital course.  Of note, patient had similar presentation in February with altered mental status and hyponatremia with sodium 124 at which time she was extensively evaluated by neurology.  She was treated for UTI/metabolic encephalopathy and subsequently discharged to rehab.  In that admission patient also received Keppra for possible seizure prophylaxis.  Her MRI showed remote  lacunar infarct and no new strokes.  At the time of discharge it appears that she was able to ambulate short distances with a walker and was arranged for home health services.  In the current admission, her urine cultures grew Carbapenem resistant E.coli and patient seen by ID, received 1 dose of fosfomycin on 8/16.     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)   Cryptogenic cirrhosis of liver (HCC)   CKD (chronic kidney disease), stage III (HCC)   Encephalopathy   Debility   Diabetes mellitus type 2 in nonobese (HCC)   Benign essential HTN   Hyponatremia   Anasarca   Volume overload   Hyperkalemia   Pyuria   Pressure injury of skin  Metabolic Encephalopathy/lethargy/hallucination - MRI obtained on 8/17 showed similar findings from February with significant cerebral cortical volume loss/chronic small vessel disease -likely multifactorial with cirrhosis, hyponatremia, renal failure -Became very drowsy after seroquel, will half seroquel dose  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  Ammonia level at 11 on admission.   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  Patient noted to be hypoxic on the night of 8/16 with O2 sats in the high 80s.  She was placed on 2 L O2 via nasal cannula. Chest x-ray shows diffuse haziness consistent with pulmonary edema versus aspiration pneumonitis.  Given leukocytosis, started on Unasyn.  She also received extra dose of Lasix last night, holding diuretics today in view of problem #3. May have underlying sleep apnea as well. Echo in this admission showed preserved EF, mild diastolic impairment  Abdominal pain/constipation: Patient seen by GI and CT suggestive of possible proximal colitis.  She finished antibiotic course with cefepime/Flagyl in the initial hospital course.   Insulin dependent dm2 -am blood glucose 236, on insulin , decreased oral intake and worsening of renal function, will need to adjust insulin prn  H/o seizures - last admit in Jan 2020, placed on Keppra but then dc'd at neuro f/u visit  FTT:  Both GI and nephrology recommended palliative care for goals of care Palliative care consulted  DVT Prophylaxis: change from lovenox to heparin due to worsening of renal function  Code Status: DNR, discussed with son  Family Communication: patient , son at bedside  Disposition Plan: poor prognosis, not improving, family decided on focus care on comfort , if cr continue to rise, family would like to take patient home, family agreed to palliative care consult while patient is here   Consultants:  [facility] Dr Matthias HughsBuccini on 8/16  Nephrology  Palliative care  Procedures:  none  Antibiotics:  As above   Objective: BP (!) 137/53    Pulse 66    Temp 99.5 F (37.5 C) (Oral)    Resp 20    Ht 5\' 3"  (1.6 m)    Wt 84 kg    SpO2 90%    BMI 32.80 kg/m   Intake/Output Summary (Last 24  hours) at 12/27/2018 0803 Last data filed at 12/27/2018 0300 Gross per 24 hour  Intake 654.89 ml  Output 1 ml  Net 653.89 ml   Filed Weights   12/25/18 1317 12/26/18 0431 12/27/18 0450  Weight: 84.3 kg 80.3 kg 84 kg    Exam: Patient is examined daily including today on 12/27/2018, exams remain the same as of yesterday except that has changed    General:  Awake, but lethargic, generalized edema  Cardiovascular: RRR  Respiratory: diminish   Abdomen: Soft/ND/NT, positive BS  Musculoskeletal: generalized Edema  Neuro: alert, lethargic    Data Reviewed: Basic Metabolic Panel: Recent Labs  Lab 12/23/18 0148 12/24/18 0530 12/25/18 0216 12/26/18 0529 12/27/18 0519  NA 126* 128* 126* 127* 128*  K 4.4 5.3* 4.0 3.6 PENDING  CL 94* 96* 94* 94* 97*  CO2 20* 21* 20* 20* 18*  GLUCOSE 234* 145* 220* 196* 236*  BUN 19 19 21  29* 41*  CREATININE 1.49* 1.41* 1.87* 2.42* 3.39*  CALCIUM 8.2* 8.5* 8.8* 9.0 8.8*   Liver Function Tests: Recent Labs  Lab 12/22/18 0215 12/25/18 0216  ALBUMIN 2.8* 4.9   Recent Labs  Lab 12/21/18 0630  LIPASE 20   No results for input(s): AMMONIA in the last 168 hours. CBC: Recent Labs  Lab 12/21/18 [phone]  12/24/18 0738 12/24/18 1250 12/25/18 1401 12/27/18 0519  WBC 8.3 8.5  --  14.7* 12.4*  NEUTROABS 4.5 5.7  --   --   --   HGB 9.9* 8.4*  --  8.0* 7.8*  HCT 31.6* 26.4* 25.7* 25.7* 25.0*  MCV 92.1 90.7  --  93.8 94.0  PLT 212 158  --  171 167   Cardiac Enzymes:   No results for input(s): CKTOTAL, CKMB, CKMBINDEX, TROPONINI in the last 168 hours. BNP (last 3 results) Recent Labs    12/25/18 1401  BNP 1,185.2*    ProBNP (last 3 results) No results for input(s): PROBNP in the last 8760 hours.  CBG: Recent Labs  Lab 12/26/18 0757 12/26/18 1132 12/26/18 1618 12/26/18 2038 12/27/18 0735  GLUCAP 195* 206* 198* 168* 227*    Recent Results (from the past 240 hour(s))  Urine culture     Status: Abnormal   Collection Time:  12/19/18  5:15 AM   Specimen: Urine, Catheterized  Result Value Ref Range Status   Specimen Description   Final    URINE, CATHETERIZED Performed at [facility], [place] [address], [place], [place] [address]    Special Requests   Final    NONE Performed at [facility], [place] [address] [place], [place] [address]    Culture (A)  Final    >=100,000 COLONIES/mL ESCHERICHIA COLI Confirmed Extended Spectrum Beta-Lactamase Producer (ESBL).  In bloodstream infections from ESBL organisms, carbapenems are preferred over piperacillin/tazobactam. They are shown to have a lower risk of mortality. CONFIRMED CARBAPENEMASE RESISTANT ENTEROBACTERIACAE    Report Status 12/24/2018 FINAL  Final   Organism ID, Bacteria ESCHERICHIA COLI (A)  Final      Susceptibility   Escherichia coli - MIC*    AMPICILLIN >=32 RESISTANT Resistant     CEFAZOLIN >=64 RESISTANT Resistant     CEFTRIAXONE >=64 RESISTANT Resistant     CIPROFLOXACIN >=4 RESISTANT Resistant     GENTAMICIN >=16 RESISTANT Resistant     IMIPENEM >=16 RESISTANT Resistant     NITROFURANTOIN 32 SENSITIVE Sensitive     TRIMETH/SULFA >=320 RESISTANT Resistant     AMPICILLIN/SULBACTAM >=32 RESISTANT Resistant     PIP/TAZO >=128 RESISTANT Resistant     Extended ESBL POSITIVE Resistant     * >=100,000 COLONIES/mL ESCHERICHIA COLI  Carbapenem Resistance Panel     Status: Abnormal   Collection Time: 12/19/18  5:20 AM  Result Value Ref Range Status   Carba Resistance IMP Gene NOT DETECTED NOT DETECTED Final   Carba Resistance VIM Gene NOT DETECTED NOT DETECTED Final   Carba Resistance NDM Gene DETECTED (A) NOT DETECTED Final    Comment: CRITICAL RESULT CALLED TO, READ BACK BY AND VERIFIED WITH: RN ZOE SUGGS [id number] FCP    Carba Resistance KPC Gene NOT DETECTED NOT DETECTED Final   Carba Resistance OXA48 Gene NOT DETECTED NOT DETECTED Final    Comment: (NOTE) Cepheid Carba-R is an FDA-cleared nucleic acid  amplification test  (NAAT)for the detection and differentiation of genes encoding the  most prevalent carbapenemases in bacterial isolate samples. Carbapenemase gene identification and implementation of comprehensive  infection control measures are recommended by the CDC to prevent the  spread of the resistant organisms. Performed at [facility], [place] [address] [place], [place] [address]   SARS CORONAVIRUS 2 Nasal Swab Aptima Multi Swab     Status: None   Collection Time: 12/19/18  5:24 AM   Specimen: Aptima Multi Swab;  Nasal Swab  Result Value Ref Range Status   SARS Coronavirus 2 NEGATIVE NEGATIVE Final    Comment: (NOTE) SARS-CoV-2 target nucleic acids are NOT DETECTED. The SARS-CoV-2 RNA is generally detectable in upper and lower respiratory specimens during the acute phase of infection. Negative results do not preclude SARS-CoV-2 infection, do not rule out co-infections with other pathogens, and should not be used as the sole basis for treatment or other patient management decisions. Negative results must be combined with clinical observations, patient history, and epidemiological information. The expected result is Negative. Fact Sheet for Patients: HairSlick.nohttps://www.fda.gov/media/138098/download Fact Sheet for Healthcare Providers: quierodirigir.comhttps://www.fda.gov/media/138095/download This test is not yet approved or cleared by the [place] FDA and  has been authorized for detection and/or diagnosis of SARS-CoV-2 by FDA under an Emergency Use Authorization (EUA). This EUA will remain  in effect (meaning this test can be used) for the duration of the COVID-19 declaration under Section 56 4(b)(1) of the Act, 21 U.S.C. section 360bbb-3(b)(1), unless the authorization is terminated or revoked sooner. Performed at [facility], [address] [place], [place] [address]   MRSA PCR Screening     Status: None   Collection Time: 12/19/18 12:29 PM   Specimen: Nasal Mucosa;  Nasopharyngeal  Result Value Ref Range Status   MRSA by PCR NEGATIVE NEGATIVE Final    Comment:        The GeneXpert MRSA Assay (FDA approved for NASAL specimens only), is one component of a comprehensive MRSA colonization surveillance program. It is not intended to diagnose MRSA infection nor to guide or monitor treatment for MRSA infections. Performed at [facility], [address] [place], [place] [address]   Culture, blood (routine x 2)     Status: None   Collection Time: 12/21/18  6:12 AM   Specimen: BLOOD RIGHT ARM  Result Value Ref Range Status   Specimen Description   Final    BLOOD RIGHT ARM Performed at [facility], [address] [place], [place] [address]    Special Requests   Final    BOTTLES DRAWN AEROBIC ONLY Blood Culture adequate volume Performed at [facility], [address] [place], [place] [address]    Culture   Final    NO GROWTH 5 DAYS Performed at [facility], [address] [place], [place] [address]    Report Status 12/26/2018 FINAL  Final  Culture, blood (routine x 2)     Status: None   Collection Time: 12/21/18  6:13 AM   Specimen: BLOOD RIGHT HAND  Result Value Ref Range Status   Specimen Description   Final    BLOOD RIGHT HAND Performed at [facility] [place] [facility], [address] [place], [place] [address]    Special Requests   Final    BOTTLES DRAWN AEROBIC ONLY Blood Culture adequate volume Performed at [facility], [address] [place], [place] [address]    Culture   Final    NO GROWTH 5 DAYS Performed at [facility], [address] [place], [place] [address]    Report Status 12/26/2018 FINAL  Final     Studies: No results found.  Scheduled Meds:  amLODipine  10 mg Oral Daily   feeding supplement (GLUCERNA SHAKE)  237 mL Oral TID BM   heparin injection (subcutaneous)  5,000 Units Subcutaneous Q8H   hydrALAZINE  50  mg Oral Q8H   insulin aspart  0-15 Units Subcutaneous  TID WC   insulin aspart  0-5 Units Subcutaneous QHS   insulin aspart  3 Units Subcutaneous TID WC   insulin glargine  7 Units Subcutaneous Daily   latanoprost  1 drop Both Eyes QHS   levothyroxine  25 mcg Oral QAC breakfast   metoprolol tartrate  25 mg Oral BID   multivitamin with minerals  1 tablet Oral Daily   pantoprazole  40 mg Oral Daily   polyethylene glycol  17 g Oral BID   QUEtiapine  25 mg Oral QHS   rosuvastatin  10 mg Oral QHS   senna-docusate  2 tablet Oral BID   sodium chloride flush  3 mL Intravenous Q12H    Continuous Infusions:  sodium chloride 10 mL/hr at 12/22/18 1127   ampicillin-sulbactam (UNASYN) IV 1.5 g (12/26/18 2132)   sodium chloride       Time spent: 35mins, case discussed with nephrology I have personally reviewed and interpreted on  12/27/2018 daily labs, tele strips, imagings as discussed above under date review session and assessment and plans.  I reviewed all nursing notes, pharmacy notes, consultant notes,  vitals, pertinent old records  I have discussed plan of care as described above with RN , patient and family on 12/27/2018   Albertine GratesFang Takeia Ciaravino MD, PhD, FACP  [facility] Pager [phone]. If 7PM-7AM, please contact night-coverage at [email or web address], password [email or web address] 12/27/2018, 8:03 AM  LOS: 8 days

## 2018-12-27 NOTE — Progress Notes (Addendum)
[facility] Progress Note   Subjective/Interval events:    Creat up to 3.3, UOP 230 cc  Yest.  Pt more lethargic and confused. Son is in the room.    Objective Vitals:   12/27/18 0450 12/27/18 0505 12/27/18 0755 12/27/18 1415  BP:  (!) 119/50 (!) 137/53 125/69  Pulse:  (!) 55 66 67  Resp:  20  18  Temp:  99.5 F (37.5 C)  98.3 F (36.8 C)  TempSrc:  Oral    SpO2:  90%  97%  Weight: 84 kg     Height:       Physical Exam General: awake, alert, lying at 45degrees lethargic, eyes open to voice Heart: RRR, no rub Lungs: clear ant Abdomen: soft, mod distended, nontender Extremities: bilat dependent hip/ thigh edema and UE edema 2+, no pretib edema Neuro: awake, moves arms , not legs much , interacting   CXR 8/17 yest showed no RLL infiltrates, poss IS pattern    Assessment/Plan: AKI on CKD III-  baseline creat 1.1-1.3, creat up to 3.3 today. Diuretics are on hold.  Urine lytes remains c/w hepatorenal physiology. She appears to be becoming symptomatic/ lethargic.  Doubt midodrine would affect a change w/o hypotension, will add octreotide.  Alb was 4.9 after multiple days IV albumin so will repeat a level before giving more.  She would not do well on dialysis w/ advanced cirrhosis and chronic debility. Have d/w at length w/ pt's son. If renal function doesn't turn around in the next few days we will be facing EOL.  Questions answered.  If comfort care son states they would take patient home.   Hyponatremia:  Severe, acute on chronic, w/ edema/ hypoalb and cirrhosis. ECHO showed normal LVEF.  Na 114 on admit improved to 126- 129 range w/ lasix/ albumin and fluid restriction.  Don't expect this can be resolved. Lasix on hold w/ rising creat.   Edema : due to cirrhosis most likely. Holding diuretics due to ^Cr  Mild hypoxic resp failure: RLL infiltrates on CXR, pulm edema vs asp pneumonitis. ^WBC so started on IV Unasyn.   Abd pain:  Initial CT with possible colitis, no  ascites.  Serial abd x rays unrevealing.  GI consulting.    E coli UTI: +ESBL+, on IV abx, per primary  HTN: bp's lower, dc'd metop and decreased hydralazine/ norvasc  H/o seizure d/o:  Had seizure in similar setting, was started on keppra but d/c'd at neurology f/u.  Correcting underlying issues that may have promoted seizure in past.   Debility: per discussions w/ family pt [facility] around [facility]- [facility]  Vinson Moselleob Landry Lookingbill, MD 12/26/2018, 4:21 PM   Additional Objective Labs: Basic Metabolic Panel: Recent Labs  Lab 12/25/18 0216 12/26/18 0529 12/27/18 0519  NA 126* 127* 128*  K 4.0 3.6 5.0  CL 94* 94* 97*  CO2 20* 20* 18*  GLUCOSE 220* 196* 236*  BUN 21 29* 41*  CREATININE 1.87* 2.42* 3.39*  CALCIUM 8.8* 9.0 8.8*   Liver Function Tests: Recent Labs  Lab 12/22/18 0215 12/25/18 0216  ALBUMIN 2.8* 4.9   Recent Labs  Lab 12/21/18 0630  LIPASE 20   CBC: Recent Labs  Lab 12/21/18 0612 12/24/18 0738 12/24/18 1250 12/25/18 1401 12/27/18 0519  WBC 8.3 8.5  --  14.7* 12.4*  NEUTROABS 4.5 5.7  --   --   --   HGB 9.9* 8.4*  --  8.0* 7.8*  HCT 31.6* 26.4*  25.7* 25.7* 25.0*  MCV 92.1 90.7  --  93.8 94.0  PLT 212 158  --  171 167   Blood Culture    Component Value Date/Time   SDES  12/21/2018 0613    BLOOD RIGHT HAND Performed at [facility], [place] [address], [place], [place] [address]    SPECREQUEST  12/21/2018 [phone]    BOTTLES DRAWN AEROBIC ONLY Blood Culture adequate volume Performed at [place] [address], [place], [place] [address]    CULT  12/21/2018 [phone]    NO GROWTH 5 DAYS Performed at [facility], [place] [address], [place], [place] [address]    REPTSTATUS 12/26/2018 FINAL 12/21/2018 9458    Cardiac Enzymes: No results for input(s): CKTOTAL, CKMB, CKMBINDEX, TROPONINI in the last 168 hours. CBG: Recent Labs  Lab 12/26/18 1132 12/26/18 1618 12/26/18 2038 12/27/18 0735  12/27/18 1124  GLUCAP 206* 198* 168* 227* 180*   Iron Studies:  No results for input(s): IRON, TIBC, TRANSFERRIN, FERRITIN in the last 72 hours. @lablastinr3 @ Studies/Results: US Renal  Result Date: 12/27/2018 CLINICAL DATA:  Elevated creatinine EXAM: RENAL / URINARY TRACT ULTRASOUND COMPLETE COMPARISON:  CT abdomen pelvis 12/19/2018 FINDINGS: Right Kidney: Renal measurements: 10.4 x 4.5 x 4.9 cm = volume: 118 mL. Limited visualization due to shadowing bowel gas but echogenicity grossly normal. No definite hydronephrosis or mass. Left Kidney: Renal measurements: 10.0 x 4.4 x 4.1 = volume: 95 mL. Limited visualization due to shadowing bowel gas but echogenicity grossly normal. No definite hydronephrosis or mass. Bladder: Appears normal for degree of bladder distention. IMPRESSION: Visualization somewhat limited due to shadowing bowel gas but no definite hydronephrosis. Electronically Signed   By: Audie Pinto M.D.   On: 12/27/2018 09:48   Medications: . sodium chloride 10 mL/hr at 12/22/18 1127  . ampicillin-sulbactam (UNASYN) IV 1.5 g (12/27/18 1036)  . sodium chloride     . amLODipine  5 mg Oral Daily  . feeding supplement (GLUCERNA SHAKE)  237 mL Oral TID BM  . heparin injection (subcutaneous)  5,000 Units Subcutaneous Q8H  . hydrALAZINE  25 mg Oral Q8H  . insulin aspart  0-15 Units Subcutaneous TID WC  . insulin aspart  0-5 Units Subcutaneous QHS  . insulin aspart  3 Units Subcutaneous TID WC  . insulin glargine  7 Units Subcutaneous Daily  . latanoprost  1 drop Both Eyes QHS  . levothyroxine  25 mcg Oral QAC breakfast  . metoprolol tartrate  12.5 mg Oral BID  . multivitamin with minerals  1 tablet Oral Daily  . pantoprazole  40 mg Oral Daily  . polyethylene glycol  17 g Oral BID  . QUEtiapine  12.5 mg Oral QHS  . rosuvastatin  10 mg Oral QHS  . senna-docusate  2 tablet Oral BID  . sodium chloride flush  3 mL Intravenous Q12H

## 2018-12-27 NOTE — Progress Notes (Signed)
Palliative:  Consult received and chart reviewed. Attempted to reach out to patient's son [name]. Voicemail left with call back number. Will attempt to speak with him again tomorrow.  Thank you for this consult.  Juel Burrow, DNP, AGNP-C Palliative Medicine Team Team Phone # [phone]  Pager # [phone]  NO CHARGE

## 2018-12-27 NOTE — Progress Notes (Signed)
Physical Therapy Treatment Patient Details Name: Lori Hardy MRN: [id number] DOB: 25-Dec-1940 Today's Date: 12/27/2018    History of Present Illness Lori Hardy is a 78 y.o. female with hx of DM2, HTN and cirrhosis brought to ED by family w/ AMS, nausea and abd distension. Became confused at home.  Hx of low Na in the past, admitted for this in Feb 2020.    PT Comments    Pt with minimal involvement with PT this session, tolerating only LE ROM exercises and repositioning in bed requiring total assist +2. Pt declined OOB or EOB mobility this session, and was very lethargic throughout session. Pt had removed O2 upon arrival to room, with sats at 81% on RA. PT reapplied 3LO2 (already set on wall); with recovery of sats >92% pt with improved arousal and awareness. Unable to speak with pt's son who was talking to MD right outside of room, PT updated plan to reflect recommendation for SNF given pt requiring total assist +2 for all mobility. Per chart review, pt also with potential for palliative care consult. If pt goes home, PT recommending pt have ramp installed and have a hospital bed delivered. PT to continue to follow acutely.    Follow Up Recommendations  SNF;Supervision/Assistance - 24 hour(VS home with HHPT and installation of ramp)     Equipment Recommendations  Hospital bed    Recommendations for Other Services       Precautions / Restrictions Precautions Precautions: Fall Precaution Comments: pt understands limited phrases, responds yes/no Restrictions Weight Bearing Restrictions: No    Mobility  Bed Mobility               General bed mobility comments: declined, states "no" when pt attempted to assist pt to EOB. PT and PT aide repositioned pt higher in bed, with pillows supported under head and LUE, with application of O2 as pt had removed it and was satting at 81%.  Transfers                    Ambulation/Gait                 Stairs             Wheelchair Mobility    Modified Rankin (Stroke Patients Only)       Balance Overall balance assessment: (NT - pt defers OOB mobility)                                          Cognition Arousal/Alertness: Lethargic Behavior During Therapy: WFL for tasks assessed/performed Overall Cognitive Status: Impaired/Different from baseline                                 General Comments: very difficult to keep pt awake      Exercises General Exercises - Lower Extremity Ankle Circles/Pumps: PROM;Both;15 reps;Supine Heel Slides: PROM;Both;10 reps;Supine    General Comments        Pertinent Vitals/Pain Pain Assessment: Faces Faces Pain Scale: Hurts little more Pain Location: stomach Pain Descriptors / Indicators: Grimacing Pain Intervention(s): Limited activity within patient's tolerance;Monitored during session;Repositioned;Other (comment)(RN notified of abdominal discomfort)    Home Living                      Prior Function  PT Goals (current goals can now be found in the care plan section) Acute Rehab PT Goals Patient Stated Goal: No goals stated PT Goal Formulation: With patient/family Time For Goal Achievement: 01/07/19 Potential to Achieve Goals: Fair Progress towards PT goals: Not progressing toward goals - comment(pt very lethargic)    Frequency    Min 3X/week      PT Plan Discharge plan needs to be updated    Co-evaluation              AM-PAC PT "6 Clicks" Mobility   Outcome Measure  Help needed turning from your back to your side while in a flat bed without using bedrails?: Total Help needed moving from lying on your back to sitting on the side of a flat bed without using bedrails?: Total Help needed moving to and from a bed to a chair (including a wheelchair)?: Total Help needed standing up from a chair using your arms (e.g., wheelchair or bedside chair)?: Total Help needed to walk in  hospital room?: Total Help needed climbing 3-5 steps with a railing? : Total 6 Click Score: 6    End of Session   Activity Tolerance: Patient limited by lethargy;Patient limited by pain Patient left: in bed;with call bell/phone within reach;with family/visitor present;with bed alarm set;with SCD's reapplied Nurse Communication: Mobility status PT Visit Diagnosis: Muscle weakness (generalized) (M62.81);Difficulty in walking, not elsewhere classified (R26.2)     Time: 0175-1025 PT Time Calculation (min) (ACUTE ONLY): 16 min  Charges:  $Therapeutic Activity: 8-22 mins                    Lori Hardy, PT Acute Rehabilitation Services Pager [phone]  Office [phone]    Lori Hardy 12/27/2018, 3:17 PM

## 2018-12-28 DIAGNOSIS — J9601 Acute respiratory failure with hypoxia: Secondary | ICD-10-CM

## 2018-12-28 DIAGNOSIS — N179 Acute kidney failure, unspecified: Secondary | ICD-10-CM

## 2018-12-28 DIAGNOSIS — Z794 Long term (current) use of insulin: Secondary | ICD-10-CM

## 2018-12-28 DIAGNOSIS — Z515 Encounter for palliative care: Secondary | ICD-10-CM

## 2018-12-28 DIAGNOSIS — G9341 Metabolic encephalopathy: Secondary | ICD-10-CM

## 2018-12-28 DIAGNOSIS — K767 Hepatorenal syndrome: Secondary | ICD-10-CM | POA: Diagnosis not present

## 2018-12-28 DIAGNOSIS — E871 Hypo-osmolality and hyponatremia: Secondary | ICD-10-CM | POA: Diagnosis not present

## 2018-12-28 LAB — BASIC METABOLIC PANEL
Anion gap: 13 (ref 5–15)
BUN: 54 mg/dL — ABNORMAL HIGH (ref 8–23)
CO2: 18 mmol/L — ABNORMAL LOW (ref 22–32)
Calcium: 8.7 mg/dL — ABNORMAL LOW (ref 8.9–10.3)
Chloride: 96 mmol/L — ABNORMAL LOW (ref 98–111)
Creatinine, Ser: 4.12 mg/dL — ABNORMAL HIGH (ref 0.44–1.00)
GFR calc Af Amer: 11 mL/min — ABNORMAL LOW (ref >60)
GFR calc non Af Amer: 10 mL/min — ABNORMAL LOW (ref >60)
Glucose, Bld: 187 mg/dL — ABNORMAL HIGH (ref 70–99)
Potassium: 4.4 mmol/L (ref 3.5–5.1)
Sodium: 127 mmol/L — ABNORMAL LOW (ref 135–145)

## 2018-12-28 LAB — AMMONIA: Ammonia: 46 umol/L — ABNORMAL HIGH (ref 9–35)

## 2018-12-28 LAB — URINE CULTURE: Culture: 60000 — AB

## 2018-12-28 LAB — GLUCOSE, CAPILLARY
Glucose-Capillary: 186 mg/dL — ABNORMAL HIGH (ref 70–99)
Glucose-Capillary: 176 mg/dL — ABNORMAL HIGH (ref 70–99)

## 2018-12-28 MED ORDER — ONDANSETRON HCL 4 MG PO TABS: 4.0000 mg | ORAL_TABLET | Freq: Four times a day (QID) | ORAL | 0 refills | Status: AC | PRN

## 2018-12-28 MED ORDER — QUETIAPINE FUMARATE 25 MG PO TABS: 12.5000 mg | ORAL_TABLET | Freq: Two times a day (BID) | ORAL | 0 refills | Status: AC | PRN

## 2018-12-28 MED ORDER — BASAGLAR KWIKPEN 100 UNIT/ML ~~LOC~~ SOPN: 8.0000 [IU] | PEN_INJECTOR | Freq: Every day | SUBCUTANEOUS | 0 refills | Status: AC

## 2018-12-28 MED ORDER — METOPROLOL TARTRATE 25 MG PO TABS: 12.5000 mg | ORAL_TABLET | Freq: Two times a day (BID) | ORAL | 0 refills | Status: AC

## 2018-12-28 MED ORDER — OXYCODONE HCL 5 MG PO TABS: 5.0000 mg | ORAL_TABLET | ORAL | 0 refills | Status: AC | PRN

## 2018-12-28 MED ORDER — LACTULOSE 10 GM/15ML PO SOLN: 20.0000 g | Freq: Every day | ORAL | 0 refills | Status: AC | PRN

## 2018-12-28 NOTE — TOC Transition Note (Signed)
Transition of Care [facility]) - CM/SW Discharge Note   Patient Details  Name: Lori Hardy MRN: [id number] Date of Birth: [birth year]  Transition of Care [facility]) CM/SW Contact:  Nila Nephew, LCSW Phone Number: [phone] 12/28/2018, 12:18 PM   Clinical Narrative:    Pt returning home with her family today, have requested hospice care at home. Discussed agency options with son and referred to [place]. Son reports communication is important factor as Pt speaks Hindi and [place]- hospice staff use phone interpreter service and family will be present 24/7 to assist with communicating as well (family does speak [place]). Family report being pleased with this arrangement and report having some equipment at home already (?hospital bed) so feel well-prepared for pt to come home today. Arranging PTAR transportation to pt's home.       Patient Goals and CMS Choice        Discharge Placement                       Discharge Plan and Services                                     Social Determinants of Health (SDOH) Interventions     Readmission Risk Interventions No flowsheet data found.

## 2018-12-28 NOTE — Progress Notes (Signed)
Lori Hardy to be D/C'd Home with Hospice per MD order.  Discussed prescriptions and follow up appointments with the patient. Prescriptions given to patient, medication list explained in detail. Pt verbalized understanding.  Allergies as of 12/28/2018      Reactions   Brimonidine Itching   Redness and itching eye Redness and itching Redness and itching      Medication List    STOP taking these medications   amLODipine 5 MG tablet Commonly known as: NORVASC   glipiZIDE 10 MG tablet Commonly known as: GLUCOTROL   levothyroxine 25 MCG tablet Commonly known as: SYNTHROID   losartan 100 MG tablet Commonly known as: COZAAR   omeprazole 40 MG capsule Commonly known as: PRILOSEC   pioglitazone 45 MG tablet Commonly known as: ACTOS   rosuvastatin 10 MG tablet Commonly known as: CRESTOR     TAKE these medications   Basaglar KwikPen 100 UNIT/ML Sopn Inject 0.08 mLs (8 Units total) into the skin daily. Hold if blood glucose less than 80 What changed:   how much to take  additional instructions   lactulose 10 GM/15ML solution Commonly known as: CHRONULAC Take 30 mLs (20 g total) by mouth daily as needed for mild constipation.   latanoprost 0.005 % ophthalmic solution Commonly known as: XALATAN Place 1 drop into both eyes at bedtime.   metoprolol tartrate 25 MG tablet Commonly known as: LOPRESSOR Take 0.5 tablets (12.5 mg total) by mouth 2 (two) times daily. Hold if sbp less than 100 What changed:   how much to take  additional instructions   ondansetron 4 MG tablet Commonly known as: ZOFRAN Take 1 tablet (4 mg total) by mouth every 6 (six) hours as needed for nausea.   oxyCODONE 5 MG immediate release tablet Commonly known as: Oxy IR/ROXICODONE Take 1 tablet (5 mg total) by mouth every 4 (four) hours as needed for moderate pain or breakthrough pain.   QUEtiapine 25 MG tablet Commonly known as: SEROQUEL Take 0.5 tablets (12.5 mg total) by mouth 3 times/day  as needed-between meals & bedtime (hallucinatin).       Vitals:   12/27/18 2255 12/28/18 0510  BP: 135/64 (!) 127/57  Pulse: 73 75  Resp: 16 20  Temp: 98.9 F (37.2 C) 98.9 F (37.2 C)  SpO2: 96% 95%    Skin clean, dry and intact without evidence of skin break down, no evidence of skin tears noted. IV catheter discontinued intact. Site without signs and symptoms of complications. Dressing and pressure applied. Pt denies pain at this time. No complaints noted.  An After Visit Summary was printed and given to the patient's son and daughter-in-law, discharge education was provided to family. Patient to transfer home with foley catheter for comfort via PTAR.   [name] S 12/28/2018 1:05 PM

## 2018-12-28 NOTE — Plan of Care (Signed)

## 2018-12-28 NOTE — Consult Note (Signed)
Consultation Note Date: 12/28/2018   Patient Name: Lori Hardy  DOB: 1941-01-28  MRN: [id number]  Age / Sex: 78 y.o., female  PCP: Theressa Millard, MD (Inactive) Referring Physician: Florencia Reasons, MD  Reason for Consultation: Establishing goals of care  HPI/Patient Profile: 78 y.o. female  with past medical history of T2DM, HTN, cirrhosis, latent TB, and CKD admitted on 12/19/2018 with AMS and abdominal pain. Found to have Na of 114. . 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 Also treated for UTI during admission. PMT consulted for [place].   Clinical Assessment and Goals of Care: I have reviewed medical records including EPIC notes, labs and imaging, and received report from Dr. Erlinda Hong.   Attempted to speak with son yesterday evening; however, I was unable to get in touch with him. Dr. Erlinda Hong was able to speak with him and decisions were made for DNR status and home with hospice care. I discussed with Dr. Erlinda Hong if palliative consult was needed at this time - she felt family would appreciate support and help with any questions/concerns.    Therefore, I  spoke with patient's son, Lori Hardy  to discuss diagnosis prognosis, [place], EOL wishes, disposition and options.  I introduced Palliative Medicine as specialized medical care for people living with serious illness. It focuses on providing relief from the symptoms and stress of a serious illness. The goal is to improve quality of life for both the patient and the family.  We discussed his current illness and what it means in the larger context of his on-going co-morbidities.  Natural disease trajectory and expectations at EOL were discussed. Lori Hardy reviews conversation he had with Dr. Jonnie Finner. No further questions/concerns. He has good understanding of situation, poor prognosis.   Lori Hardy plans to take patient home with support of hospice and focus on  comfort. We discussed hospice philosophy of care. Lori Hardy agrees with this.   Questions and concerns were addressed. The family was encouraged to call with questions or concerns.   Primary Decision Maker NEXT OF KIN - son Lori Hardy    SUMMARY OF RECOMMENDATIONS   - home with hospice - son requests transfer with ambulance, case management has been consulted - code status addressed by Dr. Erlinda Hong - family prefer to focus on comfort/avoid hospitalization  Code Status/Advance Care Planning:  DNR  Prognosis:   Poor prognosis r/t hepatorenal syndrome/progressive renal failure/ lethargy/poor PO intake  Discharge Planning: Home with Hospice      Primary Diagnoses: Present on Admission: . Hyponatremia . AMS (altered mental status) . Anemia due to stage 3 chronic kidney disease ([place]) . Benign essential HTN . CKD (chronic kidney disease), stage III ([place]) . Debility . Acquired hypothyroidism . Anasarca . Volume overload . Hyperkalemia . Pyuria   I have reviewed the medical record, interviewed the patient and family, and examined the patient. The following aspects are pertinent.  Past Medical History:  Diagnosis Date  . Aortic atherosclerosis ([place]) 12/15/2015   Overview:  By imaging, xray 11/2015.  . Closed fracture of right proximal humerus 04/06/2017  . Compression fracture of L4 lumbar vertebra, closed, initial encounter ([place]) 01/12/2017  . Diabetes mellitus without complication ([place])   . Exposure to TB 09/26/2015  . 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  Differential includes diet, medications, possible pancreatic insufficiency.  Negative C. difficile 09/27/2015  . H. pylori infection 10/07/2016   Overview:  BX confirmed.  09/15/2016- [facility].   Marland Kitchen. History of compression fracture of spine 11/20/2015   Overview:  T12, on prolia.  Marland Kitchen. History of Helicobacter pylori  infection 11/20/2015   Overview:  By EGD 2017.  [facility]  . Hx of pancreatitis 09/26/2015   Overview:  08/2015 [facility]  . Hypertension   . Hyponatremia 04/18/2017  . 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 No active disease on chest xray 11/25/15. Pt from [place], past exposure to TB. Pt agreed to referral and treatment by [facility]- referral sent 12/15/2015 (telephone [phone], fax #[phone]).   . Metabolic encephalopathy   . 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 Pseudophakia 06/11/2015  . Seasonal allergic rhinitis 09/02/2016  . Senile osteoporosis 06/26/2015   Overview:  Bone density 12/2014 [facility]. T-4.4 and -3.8. On prolia managed by Dr. Allena KatzPatel.  Normal intact PTH in 2017.  Marland Kitchen. Thrombocytopenia (HCC) 09/26/2015  . Xerosis of skin 04/14/2016   Social History   Socioeconomic History  . Marital status: Widowed    Spouse name: Not on file  . Number of children: Not on file  . Years of education: Not on file  . Highest education level: Not on file  Occupational History  . Not on file  Social Needs  . Financial resource strain: Not on file  . Food insecurity    Worry: Not on file    Inability: Not on file  . Transportation needs    Medical: Not on file    Non-medical: Not on file  Tobacco Use  . Smoking status: Never Smoker  . Smokeless tobacco: Never Used  Substance and Sexual Activity  . Alcohol use: Not on file  . Drug use: Not on file  . Sexual activity: Not on file  Lifestyle  . Physical activity    Days per week: Not on file    Minutes per session: Not on file  . Stress: Not on file  Relationships  . Social Musicianconnections    Talks on phone: Not on file    Gets together: Not on file    Attends religious service:  Not on file    Active member of club or organization: Not on file    Attends meetings of clubs or organizations: Not on file    Relationship status: Not on file  Other Topics Concern  . Not on file  Social History Narrative   Lives with Daughter in law [place], son, two grandkids, 728 & 569 years old.    [facility], sees patient daily for 2 hours.    History reviewed. No pertinent family history. Scheduled Meds: . amLODipine  5 mg Oral Daily  . feeding supplement (GLUCERNA SHAKE)  237 mL Oral TID BM  . heparin injection (subcutaneous)  5,000 Units Subcutaneous Q8H  . hydrALAZINE  25 mg Oral Q8H  . insulin aspart  0-15 Units Subcutaneous TID WC  . insulin aspart  0-5 Units Subcutaneous QHS  . insulin aspart  3 Units Subcutaneous TID WC  . insulin glargine  7 Units Subcutaneous Daily  . latanoprost  1 drop Both Eyes QHS  . levothyroxine  25 mcg Oral QAC breakfast  . multivitamin with minerals  1 tablet Oral Daily  . pantoprazole  40 mg Oral Daily  . polyethylene glycol  17 g Oral BID  . QUEtiapine  12.5 mg Oral QHS  . rosuvastatin  10 mg Oral QHS  . senna-docusate  2 tablet Oral BID  . sodium chloride flush  3 mL Intravenous Q12H   Continuous Infusions: . sodium chloride 10 mL/hr at 12/22/18 1127  . sodium chloride     PRN Meds:.sodium chloride, acetaminophen **OR** acetaminophen, hydrALAZINE, lactulose, ondansetron **OR** ondansetron (ZOFRAN) IV, oxyCODONE, sodium chloride flush Allergies  Allergen Reactions  . Brimonidine Itching    Redness and itching eye Redness and itching Redness and itching     Vital Signs: BP (!) 127/57 (BP Location: Left Arm)   Pulse 75   Temp 98.9 F (37.2 C)   Resp 20   Ht 5\' 3"  (1.6 m)   Wt 83.9 kg   SpO2 95%   BMI 32.77 kg/m  Pain Scale: Faces   Pain Score: 0-No pain   SpO2: SpO2: 95 % O2 Device:SpO2: 95 % O2 Flow Rate: .O2 Flow Rate (L/min): 3 L/min  IO: Intake/output summary:   Intake/Output Summary  (Last 24 hours) at 12/28/2018 1158 Last data filed at 12/28/2018 1030 Gross per 24 hour  Intake 0 ml  Output 150 ml  Net -150 ml    LBM: Last BM Date: 12/27/18 Baseline Weight: Weight: 80.5 kg Most recent weight: Weight: 83.9 kg     Palliative Assessment/Data: PPS 20%    The above conversation was completed via telephone due to the visitor restrictions during the COVID-19 pandemic. Thorough chart review and discussion with necessary members of the care team was completed as part of assessment. All issues were discussed and addressed but no physical exam was performed.  Time Total: 35 minutes Greater than 50%  of this time was spent counseling and coordinating care related to the above assessment and plan.  Gerlean RenShae Lee Devontay Celaya, DNP, AGNP-C Palliative Medicine Team [phone] Pager: [phone]

## 2018-12-28 NOTE — Discharge Summary (Signed)
Discharge Summary  Lori Hardy XYV:[id number] DOB: 31-Oct-1940  PCP: Theressa Millard, MD (Inactive)  Admit date: 12/19/2018 Discharge date: 12/28/2018  Time spent: 66mns, more than 50% time spent on coordination of care.  Discharge home with home hospice, goals of care for comfort.    Discharge Diagnoses:  Active Hospital Problems   Diagnosis Date Noted   Hospice care patient 12/28/2018   Palliative care patient 12/28/2018   Pressure injury of skin 12/20/2018   Hyponatremia 12/19/2018   Anasarca 12/19/2018   Volume overload 12/19/2018   Hyperkalemia 12/19/2018   Pyuria 12/19/2018   Benign essential HTN 07/12/2018   Diabetes mellitus type 2 in nonobese (HCC)    Encephalopathy acute    Debility    CKD (chronic kidney disease), stage III ([facility]    AMS (altered mental status) 06/18/2018   An[place] due to stage 3 chronic kidney disease ([place] 03/23/2017   Acquired hypothyroidism 10/02/2015   Cryptogenic cirrhosis of liver ([facility] 08/13/2015    Resolved Hospital Problems  No resolved problems to display.    Discharge Condition: stable  Diet recommendation: diet as tolerated   Filed Weights   12/26/18 0431 12/27/18 0450 12/28/18 0500  Weight: 80.3 kg 84 kg 83.9 kg    History of present illness: (per admitting MD Dr SJonnie Finner Chief Complaint: Altered mental status, abd distension  HPI: Lori Weissmanis a 78y.o. female with hx of DM2, HTN and cirrhosis brought to ED by family w/ AMS, nausea and abd distension. Became confused last night around 11 pm.  Hx of low Na in the past, admitted for this in Feb 2020. W/U in ED showed Na+ 114, creat 1.33, K 5.2, Cl 85, CO2 23 and albumin 3.1. eGFR 38.  WBC 11  H10  plt 217. UA was cloudy, largeLE, +nitrite, 100 prot, many bact/ > 50 wbc, 0-5 rbc.  UNa 11 and UOsm 283. CT abd pelvis showed possible proximal colitis, cirrhosis and extensive atherosclerosis, especially of the SMA and its branches; also chronic haziness  of the small bowel mesentery likely from portal hypertension or prior mesenteric adenitis; also chronic mesenteric edema likely related to portal hypertension; also diffuse anasarca and NO ascites. Orders were given for 3% saline but repeat Na+ at 4 am was 119 so this was cancelled.  We are asked to see for admission.   Pt was admitted here in Feb 2020 w/ AMS due to E. Faecalis UTI. She had seizure w/ postictal Todd's paresis likely provoked in setting of ^^BS, hypoNa+, UTI/ sepsis and brain atrophy. She was started on Keppra by neurology because EEG showed R hemispheric dysfunction and she would likely have ^'d seizure risk in the future..  IV then po abx for UTI. DM and HTN as well on medications.    Patient seen in ED, poor historian.  Daughter is here , states she has been swelling up for last 2-3 weeks, drinking a lot of water / juice, thirsty.  Has been confused for about 1-2 days.    Pt was last walking good in 2018, she had a fall and since then has been not walking much.  She walked a little in 2019 and now in 2020 she is pretty much WC dependent.  Has has some abd pain the last few days, no diarrhea or N/V, no fevers.  Having headaches too, every day per the daughter.    Hospital Course:  Active Problems:   AMS (altered mental status)   Acquired hypothyroidism   Anemia due  to stage 3 chronic kidney disease ([place])   Cryptogenic cirrhosis of liver (HCC)   CKD (chronic kidney disease), stage III (HCC)   Encephalopathy acute   Debility   Diabetes mellitus type 2 in nonobese (HCC)   Benign essential HTN   Hyponatremia   Anasarca   Volume overload   Hyperkalemia   Pyuria   Pressure injury of skin   Hospice care patient   Palliative care patient   Metabolic Encephalopathy/lethargy/hallucination -MRI obtained on 8/17 showed similar findings from February with significant cerebral cortical volume loss/chronic small vessel disease -likely multifactorial with cirrhosis,  hyponatremia, renal failure -poor prognosis, home with home hospice  Liver cirrhosis:Cryptogenic. 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 Ammonia level at 11 on admission.  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 Echo in this admission showed preserved EF, mild diastolic impairment Patient noted to be hypoxicon the night of 8/16with O2 sats in the high 80s. She was placedon 2 L O2 via nasal cannula.Chest x-rayshows diffuse haziness consistent with pulmonary edema versus aspiration pneumonitis. Given leukocytosis, she was treated with  Unasyn in the hospital. She also received extra dose of Lasix , diuretics stopped due to worsening AKI . She is currently on room air. home with home hospice  Abdominal pain/constipation:Patient seen by GI and CT suggestive of possible proximal colitis. She finished antibiotic course with cefepime/Flagyl in the initial hospital course.   Insulin dependent dm2 -am blood glucose 236, on insulin , decreased oral intake and worsening of renal function, will need to decrease insulin and eventually stop insulin  H/o seizures - last admit in Jan 2020, placed on Keppra but then dc'd at neuro f/u visit  FTT:  Both GI and nephrology recommended palliative  care for goals of care Palliative care consulted home with home hospice  Code Status: DNR, discussed with son  Family Communication: patient , son at bedside  Disposition Plan: poor prognosis, not improving, family decided on focus care on comfort , cr continue to rise, family would like to take patient home, family agreed to palliative care consult and home hospice   Consultants:  [facility] Dr Cristina Gong on 8/16  Nephrology  Palliative care  Procedures:  none  Antibiotics:  As above    Procedures:  In and out cath  Consultations:  [facility]  Nephrology  Palliative care  Hospice   Discharge Exam: BP (!) 127/57 (BP Location: Left Arm)    Pulse 75    Temp 98.9 F (37.2 C)    Resp 20    Ht 5' 3"  (1.6 m)    Wt 83.9 kg    SpO2 95%    BMI 32.77 kg/m   General: alert, oriented to person Cardiovascular: RRR Respiratory: diminished at basis   Discharge Instructions You were cared for by a hospitalist during your hospital stay. If you  have any questions about your discharge medications or the care you received while you were in the hospital after you are discharged, you can call the unit and asked to speak with the hospitalist on call if the hospitalist that took care of you is not available. Once you are discharged, your primary care physician will handle any further medical issues. Please note that NO REFILLS for any discharge medications will be authorized once you are discharged, as it is imperative that you return to your primary care physician (or establish a relationship with a primary care physician if you do not have one) for your aftercare needs so that they can reassess your need for medications and monitor your lab values.  Discharge Instructions    Diet general   Complete by: As directed    Diet as tolerated   Increase activity slowly   Complete by: As directed      Allergies as of 12/28/2018      Reactions   Brimonidine Itching    Redness and itching eye Redness and itching Redness and itching      Medication List    STOP taking these medications   amLODipine 5 MG tablet Commonly known as: NORVASC   glipiZIDE 10 MG tablet Commonly known as: GLUCOTROL   levothyroxine 25 MCG tablet Commonly known as: SYNTHROID   losartan 100 MG tablet Commonly known as: COZAAR   omeprazole 40 MG capsule Commonly known as: PRILOSEC   pioglitazone 45 MG tablet Commonly known as: ACTOS   rosuvastatin 10 MG tablet Commonly known as: CRESTOR     TAKE these medications   Basaglar KwikPen 100 UNIT/ML Sopn Inject 0.08 mLs (8 Units total) into the skin daily. Hold if blood glucose less than 80 What changed:   how much to take  additional instructions   lactulose 10 GM/15ML solution Commonly known as: CHRONULAC Take 30 mLs (20 g total) by mouth daily as needed for mild constipation.   latanoprost 0.005 % ophthalmic solution Commonly known as: XALATAN Place 1 drop into both eyes at bedtime.   metoprolol tartrate 25 MG tablet Commonly known as: LOPRESSOR Take 0.5 tablets (12.5 mg total) by mouth 2 (two) times daily. Hold if sbp less than 100 What changed:   how much to take  additional instructions   ondansetron 4 MG tablet Commonly known as: ZOFRAN Take 1 tablet (4 mg total) by mouth every 6 (six) hours as needed for nausea.   oxyCODONE 5 MG immediate release tablet Commonly known as: Oxy IR/ROXICODONE Take 1 tablet (5 mg total) by mouth every 4 (four) hours as needed for moderate pain or breakthrough pain.   QUEtiapine 25 MG tablet Commonly known as: SEROQUEL Take 0.5 tablets (12.5 mg total) by mouth 3 times/day as needed-between meals & bedtime (hallucinatin).      Allergies  Allergen Reactions   Brimonidine Itching    Redness and itching eye Redness and itching Redness and itching    Follow-up Information    Theressa Millard, MD Follow up.   Specialty: Internal Medicine Why: as needed    Contact information: [address] [place] [address] [phone]            The results of significant diagnostics from this hospitalization (including imaging, microbiology, ancillary and laboratory) are listed below for reference.    Significant Diagnostic Studies: Ct Head Wo Contrast  Result Date: 12/19/2018 CLINICAL DATA:  78 year old female with altered level of consciousness, unexplained. EXAM: CT HEAD  WITHOUT CONTRAST TECHNIQUE: Contiguous axial images were obtained from the base of the skull through the vertex without intravenous contrast. COMPARISON:  06/18/2018 FINDINGS: Brain: No evidence of acute infarction, hemorrhage, hydrocephalus, extra-axial collection or mass lesion/mass effect. There is diffuse low-attenuation within the subcortical and periventricular white matter compatible with chronic microvascular disease. Prominence of the sulci and ventricles compatible with brain atrophy. Vascular: No hyperdense vessel or unexpected calcification. Skull: Normal. Negative for fracture or focal lesion. Sinuses/Orbits: No acute finding. Partial opacification of the left mastoid air cells. Other: None IMPRESSION: No acute intracranial abnormalities. 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 Electronically Signed   By: Kerby Moors M.D.   On: 12/19/2018 16:29   Mr Brain Wo Contrast  Result Date: 12/25/2018 CLINICAL DATA:  Encephalopathy. EXAM: MRI HEAD WITHOUT CONTRAST TECHNIQUE: Multiplanar, multiecho pulse sequences of the brain and surrounding structures were obtained without intravenous contrast. COMPARISON:  Head CT 12/19/2018, CT angiogram head/neck 06/18/2018, brain MRI 06/18/2018 FINDINGS: Brain: Please note the patient did not tolerate the full examination. The acquired axial T1 weighted imaging is severely motion degraded. A coronal T2 weighted sequence could not be acquired. No evidence of acute infarct. No evidence of intracranial  mass. No chronic intracranial hemorrhage. No midline shift or extra-axial collection. Moderate generalized cerebral atrophy. Advanced scattered and confluent T2/FLAIR hyperintensity within the cerebral white matter, bilateral basal ganglia and brainstem is nonspecific, but consistent with chronic small vessel ischemic disease and similar to prior MRI 06/18/2018. Vascular: Flow voids maintained within the proximal large vessels. Skull and upper cervical spine: Normal marrow signal. Sinuses/Orbits: Imaged globes and orbits demonstrate no acute abnormality. Mild scattered paranasal sinus mucosal thickening. Bilateral mastoid effusions (greater on the left). IMPRESSION: Motion degraded and prematurely terminated examination as described. No evidence of acute intracranial abnormality. Moderate generalized parenchymal atrophy. Advanced signal changes within the cerebral white matter, bilateral basal ganglia and brainstem which are non-specific but most commonly seen in the setting of chronic small vessel ischemic disease. Findings are similar to prior MRI 06/18/2018. Bilateral mastoid effusions. Electronically Signed   By: Kellie Simmering   On: 12/25/2018 12:12   Ct Abdomen Pelvis W Contrast  Result Date: 12/19/2018 CLINICAL DATA:  Abdominal distention EXAM: CT ABDOMEN AND PELVIS WITH CONTRAST TECHNIQUE: Multidetector CT imaging of the abdomen and pelvis was performed using the standard protocol following bolus administration of intravenous contrast. CONTRAST:  71m OMNIPAQUE IOHEXOL 300 MG/ML  SOLN COMPARISON:  06/07/2018 FINDINGS: Lower chest: Coronary calcification. Airway calcification collapse in the lower lobes that is partially visualized. Hepatobiliary: Cirrhotic liver with surface lobulation and fissures/caudate lobe enlargement. Apparent low-density in the right lobe liver is attributed to extensive streak artifact from jewelry -finding does not persist on delayed phase.Cholecystectomy with no bile duct  dilatation. Pancreas: Unremarkable. Spleen: Unremarkable. Adrenals/Urinary Tract: Negative adrenals. No hydronephrosis or stone. Prominent bladder wall thickness likely related to under distension-no clear pericholecystic edema. Urinalysis has been ordered. Stomach/Bowel: Thickened proximal colon with submucosal low-density appearance and mild pericolonic stranding. There is chronic mesenteric edema likely related to portal hypertension, but this focal colonic finding is new from prior. Minimal colonic diverticulosis. No appendicitis or obstruction. Vascular/Lymphatic: Extensive atherosclerotic calcification of the aorta and visceral branches. Lactic acid is normal. There is chronic haziness of the small bowel mesentery likely from portal hypertension or prior mesenteric adenitis. Reproductive:Unremarkable for age Other: Negative for ascites.  Anasarca Musculoskeletal: Remote T12 and L1 compression fractures. Remote L4 compression fracture. Generalized osteopenia. IMPRESSION: 1. Proximal colitis. 2.  Cirrhosis. 3. Extensive atherosclerosis, especially of the SMA and its branches. Electronically Signed   By: Monte Fantasia M.D.   On: 12/19/2018 04:24   US Renal  Result Date: 12/27/2018 CLINICAL DATA:  Elevated creatinine EXAM: RENAL / URINARY TRACT ULTRASOUND COMPLETE COMPARISON:  CT abdomen pelvis 12/19/2018 FINDINGS: Right Kidney: Renal measurements: 10.4 x 4.5 x 4.9 cm = volume: 118 mL. Limited visualization due to shadowing bowel gas but echogenicity grossly normal. No definite hydronephrosis or mass. Left Kidney: Renal measurements: 10.0 x 4.4 x 4.1 = volume: 95 mL. Limited visualization due to shadowing bowel gas but echogenicity grossly normal. No definite hydronephrosis or mass. Bladder: Appears normal for degree of bladder distention. IMPRESSION: Visualization somewhat limited due to shadowing bowel gas but no definite hydronephrosis. Electronically Signed   By: Audie Pinto M.D.   On: 12/27/2018  09:48   Dg Chest Port 1 View  Result Date: 12/25/2018 CLINICAL DATA:  Increased shortness of breath today. EXAM: PORTABLE CHEST 1 VIEW COMPARISON:  Chest x-rays dated 12/19/2018 and 06/18/2018. FINDINGS: New bibasilar opacities, RIGHT greater than LEFT. Increased interstitial prominence bilaterally, presumably interstitial edema. Stable cardiomegaly. No pneumothorax seen. IMPRESSION: 1. Cardiomegaly with bilateral interstitial prominence, presumably edema, indicating CHF/volume overload. 2. New opacity at the RIGHT lung base. This could represent pneumonia, atelectasis or asymmetric pulmonary edema. Also suspect a RIGHT pleural effusion. 3. New opacity at the LEFT lung base, likely atelectasis and/or small pleural effusion. Electronically Signed   By: Franki Cabot M.D.   On: 12/25/2018 10:44   Dg Chest Port 1 View  Result Date: 12/19/2018 CLINICAL DATA:  78 year old female with shortness of breath. EXAM: PORTABLE CHEST 1 VIEW COMPARISON:  Chest radiograph dated 06/18/2018 FINDINGS: Mild chronic interstitial coarsening and bronchitic changes. No focal consolidation, pleural effusion, or pneumothorax. Stable cardiac silhouette. Atherosclerotic calcification of the aortic arch. No acute osseous pathology. IMPRESSION: No active disease. Electronically Signed   By: Anner Crete M.D.   On: 12/19/2018 02:45   Dg Abd Portable 1v  Result Date: 12/23/2018 CLINICAL DATA:  Abdominal pain and distention. EXAM: PORTABLE ABDOMEN - 1 VIEW COMPARISON:  None. FINDINGS: The bowel gas pattern is normal. No radio-opaque calculi or other significant radiographic abnormality are seen. Right upper quadrant surgical clips noted. Surgical clip also noted in the left pelvis. Pelvic vascular calcification also noted IMPRESSION: Unremarkable bowel gas pattern.  No acute findings. Electronically Signed   By: Marlaine Hind M.D.   On: 12/23/2018 08:24   Dg Abd Portable 1v  Result Date: 12/22/2018 CLINICAL DATA:  Abdominal pain  EXAM: PORTABLE ABDOMEN - 1 VIEW COMPARISON:  06/25/2018 FINDINGS: Scattered large and small bowel gas is noted. Mild retained fecal material is noted. No obstructive changes are seen. No free air is noted. Degenerative changes of lumbar spine are seen. IMPRESSION: No acute abnormality noted. Electronically Signed   By: Inez Catalina M.D.   On: 12/22/2018 08:48    Microbiology: Recent Results (from the past 240 hour(s))  Urine culture     Status: Abnormal   Collection Time: 12/19/18  5:15 AM   Specimen: Urine, Catheterized  Result Value Ref Range Status   Specimen Description   Final    URINE, CATHETERIZED Performed at [facility] [address], [place], [place] [address]    Special Requests   Final    NONE Performed at [facility], [place] [address], [place],  [address]    Culture (A)  Final    >=100,000  COLONIES/mL ESCHERICHIA COLI Confirmed Extended Spectrum Beta-Lactamase Producer (ESBL).  In bloodstream infections from ESBL organisms, carbapenems are preferred over piperacillin/tazobactam. They are shown to have a lower risk of mortality. CONFIRMED CARBAPENEMASE RESISTANT ENTEROBACTERIACAE    Report Status 12/24/2018 FINAL  Final   Organism ID, Bacteria ESCHERICHIA COLI (A)  Final      Susceptibility   Escherichia coli - MIC*    AMPICILLIN >=32 RESISTANT Resistant     CEFAZOLIN >=64 RESISTANT Resistant     CEFTRIAXONE >=64 RESISTANT Resistant     CIPROFLOXACIN >=4 RESISTANT Resistant     GENTAMICIN >=16 RESISTANT Resistant     IMIPENEM >=16 RESISTANT Resistant     NITROFURANTOIN 32 SENSITIVE Sensitive     TRIMETH/SULFA >=320 RESISTANT Resistant     AMPICILLIN/SULBACTAM >=32 RESISTANT Resistant     PIP/TAZO >=128 RESISTANT Resistant     Extended ESBL POSITIVE Resistant     * >=100,000 COLONIES/mL ESCHERICHIA COLI  Carbapenem Resistance Panel     Status: Abnormal   Collection Time: 12/19/18  5:20 AM  Result Value Ref Range Status     Carba Resistance IMP Gene NOT DETECTED NOT DETECTED Final   Carba Resistance VIM Gene NOT DETECTED NOT DETECTED Final   Carba Resistance NDM Gene DETECTED (A) NOT DETECTED Final    Comment: CRITICAL RESULT CALLED TO, READ BACK BY AND VERIFIED WITH: RN ZOE SUGGS [id number] FCP    Carba Resistance KPC Gene NOT DETECTED NOT DETECTED Final   Carba Resistance OXA48 Gene NOT DETECTED NOT DETECTED Final    Comment: (NOTE) Cepheid Carba-R is an FDA-cleared nucleic acid amplification test  (NAAT)for the detection and differentiation of genes encoding the  most prevalent carbapenemases in bacterial isolate samples. Carbapenemase gene identification and implementation of comprehensive  infection control measures are recommended by the CDC to prevent the  spread of the resistant organisms. Performed at [facility], [place] [address] [place], [place] [address]   SARS CORONAVIRUS 2 Nasal Swab Aptima Multi Swab     Status: None   Collection Time: 12/19/18  5:24 AM   Specimen: Aptima Multi Swab; Nasal Swab  Result Value Ref Range Status   SARS Coronavirus 2 NEGATIVE NEGATIVE Final    Comment: (NOTE) SARS-CoV-2 target nucleic acids are NOT DETECTED. The SARS-CoV-2 RNA is generally detectable in upper and lower respiratory specimens during the acute phase of infection. Negative results do not preclude SARS-CoV-2 infection, do not rule out co-infections with other pathogens, and should not be used as the sole basis for treatment or other patient management decisions. Negative results must be combined with clinical observations, patient history, and epidemiological information. The expected result is Negative. Fact Sheet for Patients: SugarRoll.be Fact Sheet for Healthcare Providers: https://www.woods-mathews.com/ This test is not yet approved or cleared by the [place] FDA and  has been authorized for detection and/or diagnosis of SARS-CoV-2  by FDA under an Emergency Use Authorization (EUA). This EUA will remain  in effect (meaning this test can be used) for the duration of the COVID-19 declaration under Section 56 4(b)(1) of the Act, 21 U.S.C. section 360bbb-3(b)(1), unless the authorization is terminated or revoked sooner. Performed at [facility], [place] [address] [place], [place] [address]   MRSA PCR Screening     Status: None   Collection Time: 12/19/18 12:29 PM   Specimen: Nasal Mucosa; Nasopharyngeal  Result Value Ref Range Status   MRSA by PCR NEGATIVE NEGATIVE Final    Comment:  The GeneXpert MRSA Assay (FDA approved for NASAL specimens only), is one component of a comprehensive MRSA colonization surveillance program. It is not intended to diagnose MRSA infection nor to guide or monitor treatment for MRSA infections. Performed at [facility], [place] [address] [place], [place] [address]   Culture, blood (routine x 2)     Status: None   Collection Time: 12/21/18  6:12 AM   Specimen: BLOOD RIGHT ARM  Result Value Ref Range Status   Specimen Description   Final    BLOOD RIGHT ARM Performed at [facility] [address], [place], [place] [address]    Special Requests   Final    BOTTLES DRAWN AEROBIC ONLY Blood Culture adequate volume Performed at [facility] [address], [place], [place] [address]    Culture   Final    NO GROWTH 5 DAYS Performed at [facility], [place] [address] [place], [place] [address]    Report Status 12/26/2018 FINAL  Final  Culture, blood (routine x 2)     Status: None   Collection Time: 12/21/18  6:13 AM   Specimen: BLOOD RIGHT HAND  Result Value Ref Range Status   Specimen Description   Final    BLOOD RIGHT HAND Performed at [facility] [address] [place], [place] [address]    Special Requests   Final    BOTTLES DRAWN AEROBIC ONLY Blood Culture adequate  volume Performed at [facility] [address], [place], [place] [address]    Culture   Final    NO GROWTH 5 DAYS Performed at [facility], [place] [address] [place], [place] [address]    Report Status 12/26/2018 FINAL  Final     Labs: Basic Metabolic Panel: Recent Labs  Lab 12/24/18 0530 12/25/18 0216 12/26/18 0529 12/27/18 0519 12/28/18 0525  NA 128* 126* 127* 128* 127*  K 5.3* 4.0 3.6 5.0 4.4  CL 96* 94* 94* 97* 96*  CO2 21* 20* 20* 18* 18*  GLUCOSE 145* 220* 196* 236* 187*  BUN 19 21 29* 41* 54*  CREATININE 1.41* 1.87* 2.42* 3.39* 4.12*  CALCIUM 8.5* 8.8* 9.0 8.8* 8.7*   Liver Function Tests: Recent Labs  Lab 12/22/18 0215 12/25/18 0216 12/27/18 1621  ALBUMIN 2.8* 4.9 4.0   No results for input(s): LIPASE, AMYLASE in the last 168 hours. Recent Labs  Lab 12/28/18 0525  AMMONIA 46*   CBC: Recent Labs  Lab 12/24/18 0738 12/24/18 1250 12/25/18 1401 12/27/18 0519  WBC 8.5  --  14.7* 12.4*  NEUTROABS 5.7  --   --   --   HGB 8.4*  --  8.0* 7.8*  HCT 26.4* 25.7* 25.7* 25.0*  MCV 90.7  --  93.8 94.0  PLT 158  --  171 167   Cardiac Enzymes: No results for input(s): CKTOTAL, CKMB, CKMBINDEX, TROPONINI in the last 168 hours. BNP: BNP (last 3 results) Recent Labs    12/25/18 1401  BNP 1,185.2*    ProBNP (last 3 results) No results for input(s): PROBNP in the last 8760 hours.  CBG: Recent Labs  Lab 12/27/18 0735 12/27/18 1124 12/27/18 1637 12/27/18 2257 12/28/18 0737  GLUCAP 227* 180* 170* 240* 186*       Signed:  Florencia Reasons MD, PhD, FACP  [facility] 12/28/2018, 10:59 AM

## 2020-01-19 IMAGING — MR MRI HEAD WITHOUT CONTRAST
7 series · 34 of 48 positions shown · non-contrast
Comparison: Head CT 12/19/2018, CT angiogram head/neck 06/18/2018,
brain MRI 06/18/2018

CLINICAL DATA: Encephalopathy.

EXAM:
MRI HEAD WITHOUT CONTRAST
TECHNIQUE: Multiplanar, multiecho pulse sequences of the brain and surrounding
structures were obtained without intravenous contrast.

[Series 3: DWI · axial · 4.0mm · 1.17mm/px · z∈[-37,+111]mm · 9 of 68 slices shown (1 of 2)]
[im 1/68]
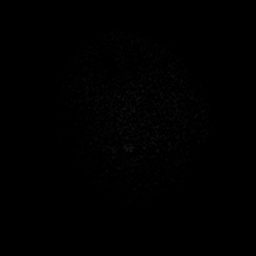
[im 9/68]
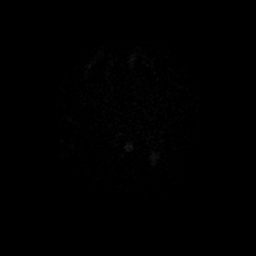
[im 17/68]
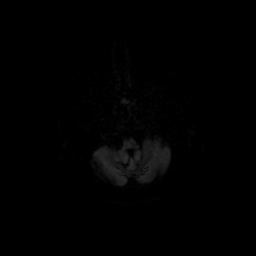
[im 26/68]
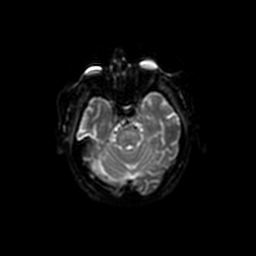
[im 34/68]
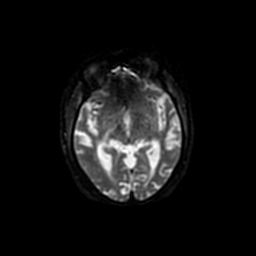
[im 42/68]
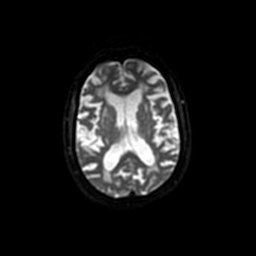
[im 51/68]
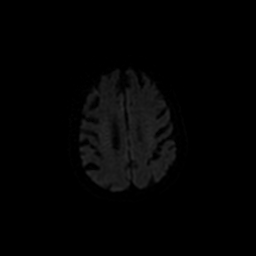
[im 59/68]
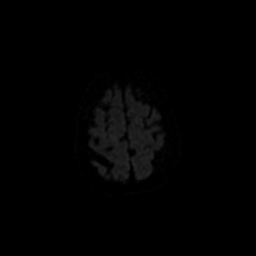
[im 68/68]
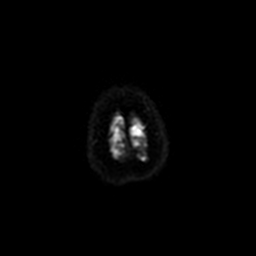

[Series 4: T1 · sagittal · 5.0mm · 0.47mm/px · 3 of 20 slices shown (1 of 2)]
[im 1/20]
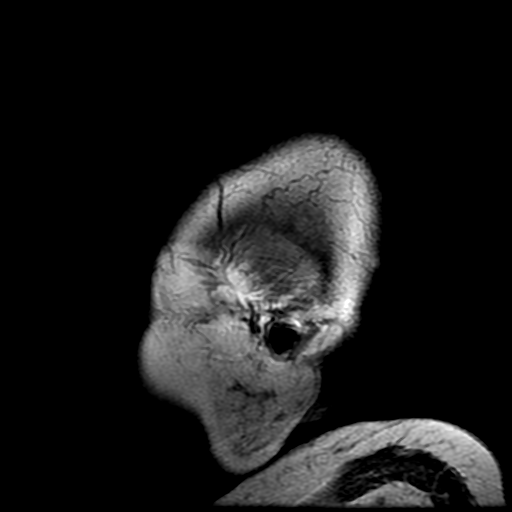
[im 10/20]
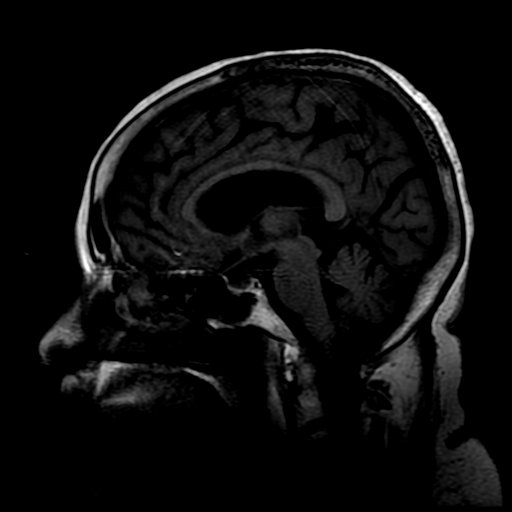
[im 20/20]
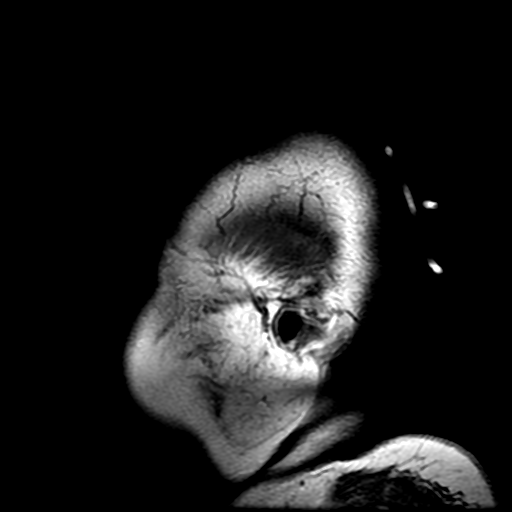

[Series 5: T2 · axial · 5.0mm · 0.86mm/px · z∈[-37,+112]mm · 4 of 26 slices shown]
[im 1/26]
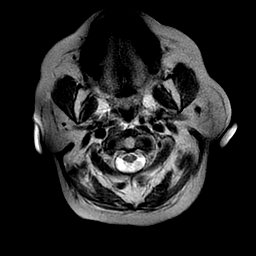
[im 9/26]
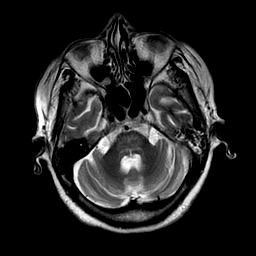
[im 17/26]
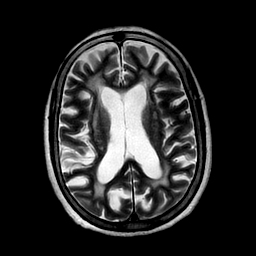
[im 26/26]
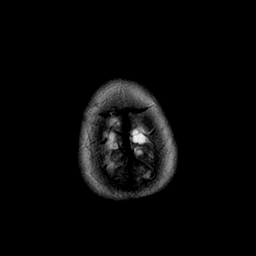

[Series 6: FLAIR · axial · 3.0mm · 0.86mm/px · z∈[-37,+112]mm · 4 of 26 slices shown]
[im 1/26]
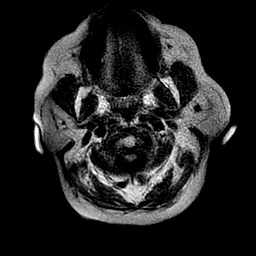
[im 9/26]
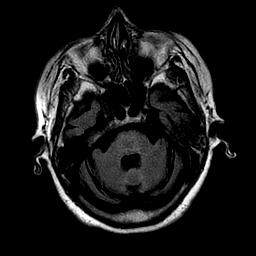
[im 17/26]
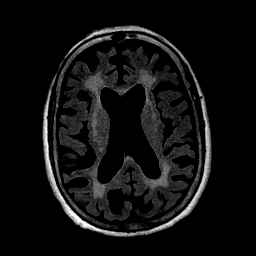
[im 26/26]
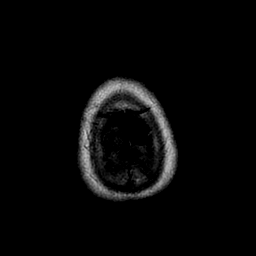

[Series 7: T2-star · axial · 3.0mm · 0.43mm/px · 1 of 30 slices shown]
[im 1/30]
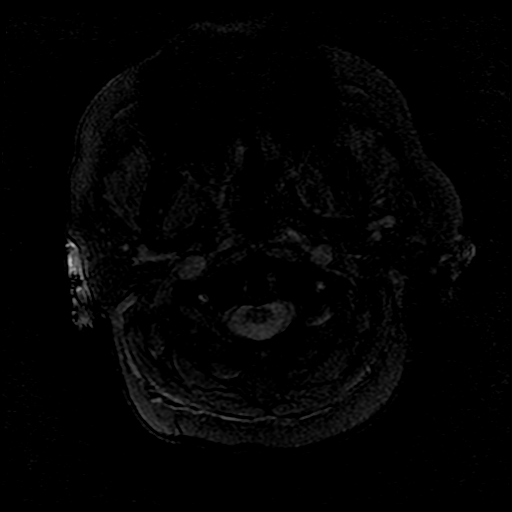

[Series 8: T1 · axial · 1.0mm · 0.47mm/px · z∈[-19,+102]mm · 8 of 138 slices shown (2 of 2)]
[im 8/138]
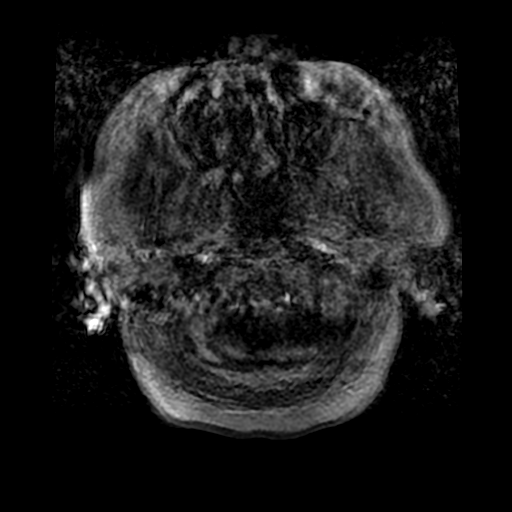
[im 23/138]
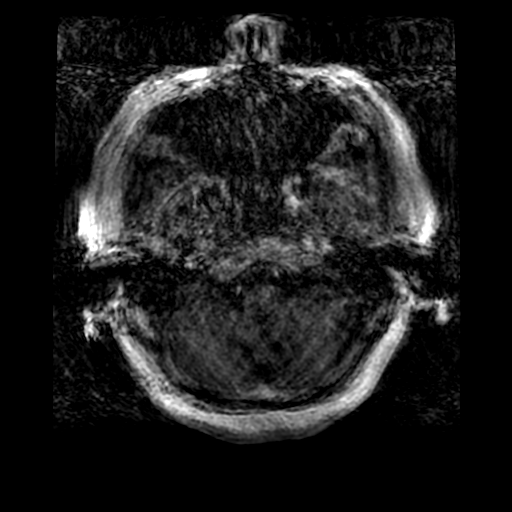
[im 39/138]
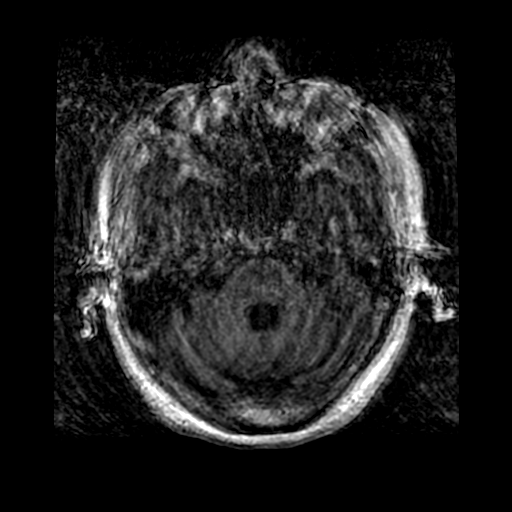
[im 61/138]
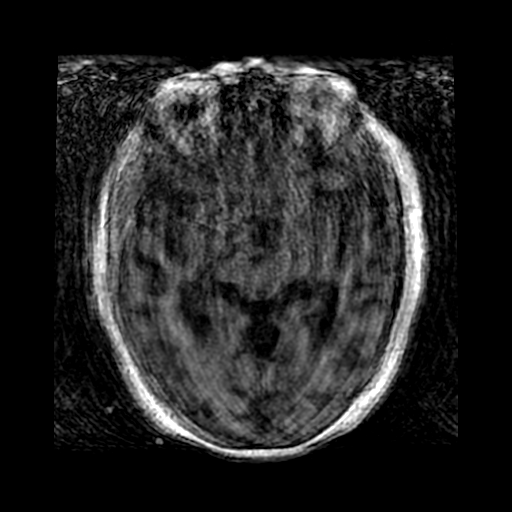
[im 77/138]
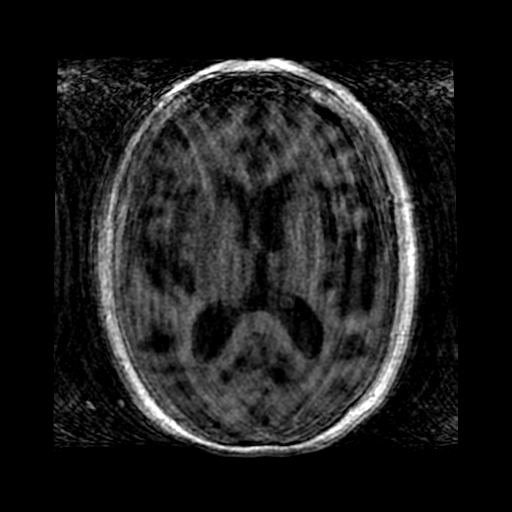
[im 99/138]
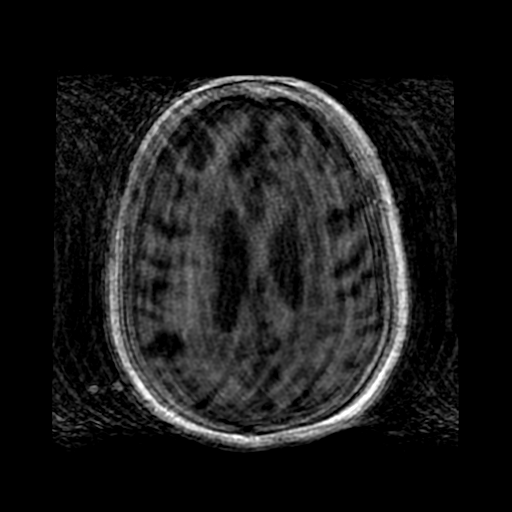
[im 115/138]
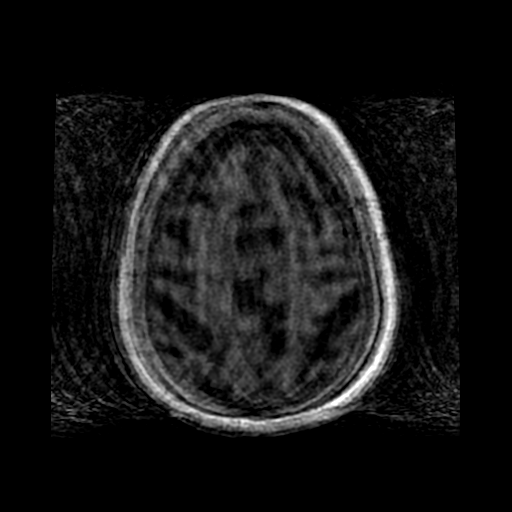
[im 130/138]
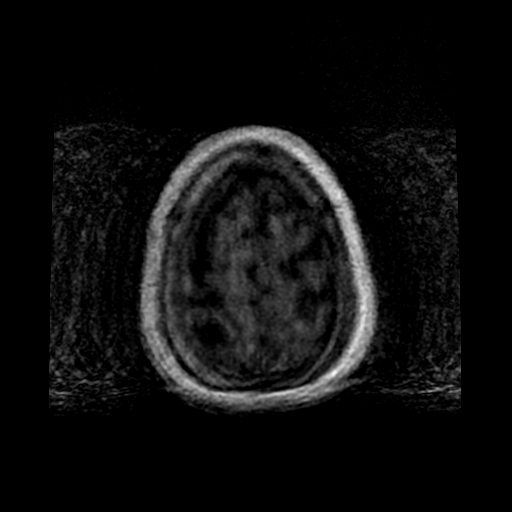

[Series 300: DWI · axial · 4.0mm · 1.17mm/px · z∈[-37,+111]mm · 5 of 34 slices shown (2 of 2)]
[im 1/34]
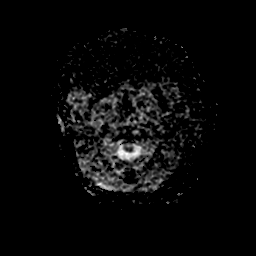
[im 9/34]
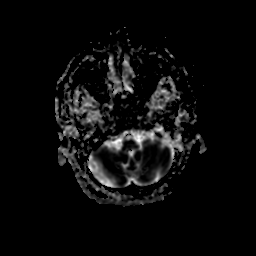
[im 17/34]
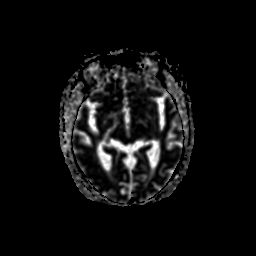
[im 25/34]
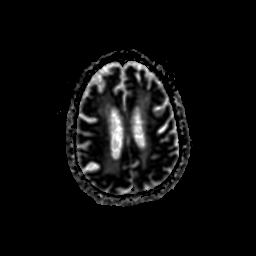
[im 34/34]
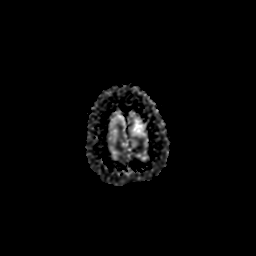

[34 of 48 positions shown; findings below may reference images not displayed]

FINDINGS: Brain: Please note the patient did not tolerate the full
examination. The acquired axial T1 weighted imaging is severely
motion degraded. A coronal T2 weighted sequence could not be
acquired. No evidence of acute infarct. No evidence of intracranial
mass. No chronic intracranial hemorrhage. No midline shift or
extra-axial collection. Moderate generalized cerebral atrophy.
Advanced scattered and confluent T2/FLAIR hyperintensity within the
cerebral white matter, bilateral basal ganglia and brainstem is
nonspecific, but consistent with chronic small vessel ischemic
disease and similar to prior MRI 06/18/2018.

Vascular: Flow voids maintained within the proximal large vessels.

Skull and upper cervical spine: Normal marrow signal.

Sinuses/Orbits: Imaged globes and orbits demonstrate no acute
abnormality. Mild scattered paranasal sinus mucosal thickening.
Bilateral mastoid effusions (greater on the left).
IMPRESSION: Motion degraded and prematurely terminated examination as described.

No evidence of acute intracranial abnormality.

Moderate generalized parenchymal atrophy. Advanced signal changes
within the cerebral white matter, bilateral basal ganglia and
brainstem which are non-specific but most commonly seen in the
setting of chronic small vessel ischemic disease. Findings are
similar to prior MRI 06/18/2018.

Bilateral mastoid effusions.
